# Patient Record
Sex: Female | Born: 1985 | Race: White | Hispanic: No | Marital: Married | Smoking: Never smoker
Health system: Southern US, Community
[De-identification: ages and names within clinical notes are randomized; demographics above are authoritative.]

## PROBLEM LIST (undated history)

## (undated) DIAGNOSIS — R112 Nausea with vomiting, unspecified: Secondary | ICD-10-CM

## (undated) DIAGNOSIS — Z9889 Other specified postprocedural states: Secondary | ICD-10-CM

## (undated) DIAGNOSIS — F419 Anxiety disorder, unspecified: Secondary | ICD-10-CM

## (undated) DIAGNOSIS — R519 Headache, unspecified: Secondary | ICD-10-CM

## (undated) DIAGNOSIS — O021 Missed abortion: Secondary | ICD-10-CM

## (undated) DIAGNOSIS — I639 Cerebral infarction, unspecified: Secondary | ICD-10-CM

## (undated) DIAGNOSIS — R51 Headache: Secondary | ICD-10-CM

## (undated) HISTORY — DX: Anxiety disorder, unspecified: F41.9

## (undated) HISTORY — DX: Cerebral infarction, unspecified: I63.9

## (undated) HISTORY — PX: WISDOM TOOTH EXTRACTION: SHX21

---

## 2013-04-05 ENCOUNTER — Telehealth: Payer: Self-pay | Admitting: Nurse Practitioner

## 2013-04-05 ENCOUNTER — Ambulatory Visit (INDEPENDENT_AMBULATORY_CARE_PROVIDER_SITE_OTHER): Payer: BC Managed Care – PPO | Admitting: Family Medicine

## 2013-04-05 ENCOUNTER — Encounter: Payer: Self-pay | Admitting: Family Medicine

## 2013-04-05 VITALS — BP 110/65 | HR 71 | Temp 98.6°F | Ht 64.0 in | Wt 162.8 lb

## 2013-04-05 DIAGNOSIS — R3915 Urgency of urination: Secondary | ICD-10-CM

## 2013-04-05 DIAGNOSIS — N390 Urinary tract infection, site not specified: Secondary | ICD-10-CM

## 2013-04-05 LAB — POCT URINALYSIS DIPSTICK
Bilirubin, UA: NEGATIVE
Glucose, UA: NEGATIVE
Nitrite, UA: NEGATIVE
Urobilinogen, UA: NEGATIVE

## 2013-04-05 LAB — POCT UA - MICROSCOPIC ONLY
Casts, Ur, LPF, POC: NEGATIVE
Yeast, UA: NEGATIVE

## 2013-04-05 NOTE — Telephone Encounter (Signed)
APPT MADE

## 2013-04-05 NOTE — Patient Instructions (Addendum)
Continue to drink plenty of fluids Will wait on treatment until urine culture and sensitivity is returned

## 2013-04-05 NOTE — Progress Notes (Signed)
  Subjective:    Patient ID: Kelly [name], female    DOB: [birth year], 27 y.o.   MRN: [id number]  HPI Recent UTI as of 2 weeks ago. Similar symptoms since 3 days ago. Maybe some increased urinary frequency but no burning. 2 weeks ago really didn't have a lot of symptoms but does have an odor to the urine that was different than usual. 2 of 3 episodes of sharp pain in the suprapubic area. No more back pain than usual. As of note she just finished her monthly period.   Review of Systems  Genitourinary: Positive for urgency and pelvic pain (slight pressure). Negative for dysuria and hematuria.  Neurological: Positive for headaches.       Objective:   Physical Exam  Constitutional: She is oriented to person, place, and time. She appears well-developed and well-nourished. No distress.  HENT:  Head: Normocephalic and atraumatic.  Eyes: Conjunctivae are normal.  Abdominal: Soft. Bowel sounds are normal. She exhibits no distension. There is no tenderness. There is no rebound and no guarding.  Musculoskeletal: Normal range of motion.  Neurological: She is alert and oriented to person, place, and time.  Skin: Skin is warm and dry.  Psychiatric: She has a normal mood and affect. Her behavior is normal. Judgment and thought content normal.   Results for orders placed in visit on 04/05/13  POCT UA - MICROSCOPIC ONLY      Result Value Range   WBC, Ur, HPF, POC 3-5     RBC, urine, microscopic 30-40     Bacteria, U Microscopic many     Mucus, UA large     Epithelial cells, urine per micros mod     Crystals, Ur, HPF, POC neg     Casts, Ur, LPF, POC neg     Yeast, UA neg    POCT URINALYSIS DIPSTICK      Result Value Range   Color, UA yellow     Clarity, UA cloudy     Glucose, UA neg     Bilirubin, UA neg     Ketones, UA neg     Spec Grav, UA 1.020     Blood, UA mod     pH, UA 5.0     Protein, UA neg     Urobilinogen, UA negative     Nitrite, UA neg     Leukocytes, UA Trace             Assessment & Plan:  1. UTI (urinary tract infection) - POCT UA - Microscopic Only - POCT urinalysis dipstick - Urine culture  2. Urgency of urination - Urine culture  Patient Instructions  Continue to drink plenty of fluids Will wait on treatment until urine culture and sensitivity is returned

## 2013-04-07 LAB — URINE CULTURE: Organism ID, Bacteria: NO GROWTH

## 2013-11-09 ENCOUNTER — Encounter: Payer: Self-pay | Admitting: Family Medicine

## 2013-11-09 ENCOUNTER — Encounter (INDEPENDENT_AMBULATORY_CARE_PROVIDER_SITE_OTHER): Payer: Self-pay

## 2013-11-09 ENCOUNTER — Ambulatory Visit (INDEPENDENT_AMBULATORY_CARE_PROVIDER_SITE_OTHER): Payer: BC Managed Care – PPO | Admitting: Family Medicine

## 2013-11-09 VITALS — BP 105/72 | HR 88 | Temp 99.0°F

## 2013-11-09 DIAGNOSIS — J209 Acute bronchitis, unspecified: Secondary | ICD-10-CM

## 2013-11-09 MED ORDER — FLUCONAZOLE 150 MG PO TABS: 150.0000 mg | ORAL_TABLET | Freq: Once | ORAL | Status: DC

## 2013-11-09 MED ORDER — METHYLPREDNISOLONE (PAK) 4 MG PO TABS
ORAL_TABLET | ORAL | Status: DC
Start: 2013-11-09 — End: 2014-01-28

## 2013-11-09 MED ORDER — AMOXICILLIN 875 MG PO TABS: 875.0000 mg | ORAL_TABLET | Freq: Two times a day (BID) | ORAL | Status: DC

## 2013-11-09 MED ORDER — BENZONATATE 100 MG PO CAPS: 200.0000 mg | ORAL_CAPSULE | Freq: Three times a day (TID) | ORAL | Status: DC | PRN

## 2013-11-09 NOTE — Progress Notes (Signed)
   Subjective:    Patient ID: Kelly [name], female    DOB: 1986-11-07, 27 y.o.   MRN: [id number]  HPI This 27 y.o. female presents for evaluation of URI. She states she was tx'd for sinusitis A week ago with abx's for 7 days and she did not get better and now it has moved into Her chest and she is couging a lot and especially at hs.   Review of Systems C/o cough and uri sx's. No chest pain, SOB, HA, dizziness, vision change, N/V, diarrhea, constipation, dysuria, urinary urgency or frequency, myalgias, arthralgias or rash.     Objective:   Physical Exam Vital signs noted  Well developed well nourished female.  HEENT - Head atraumatic Normocephalic                Eyes - PERRLA, Conjuctiva - clear Sclera- Clear EOMI                Ears - EAC's Wnl TM's Wnl Gross Hearing WNL                Nose - Nares patent                 Throat - oropharanx wnl Respiratory - Lungs CTA bilateral Cardiac - RRR S1 and S2 without murmur GI - Abdomen soft Nontender and bowel sounds active x 4        Assessment & Plan:  Acute bronchitis - Plan: amoxicillin (AMOXIL) 875 MG tablet, methylPREDNIsolone (MEDROL DOSPACK) 4 MG tablet, benzonatate (TESSALON PERLES) 100 MG capsule, fluconazole (DIFLUCAN) 150 MG tablet  Push po fluids, rest, tylenol and motrin otc prn as directed for fever, arthralgias, and myalgias.  Follow up prn if sx's continue or persist.  Deatra Canter [name]

## 2013-11-09 NOTE — Patient Instructions (Signed)

## 2013-11-10 ENCOUNTER — Telehealth: Payer: Self-pay | Admitting: Family Medicine

## 2013-11-11 NOTE — Telephone Encounter (Signed)
Bill o please address

## 2014-01-03 ENCOUNTER — Encounter: Payer: Self-pay | Admitting: Family Medicine

## 2014-01-03 ENCOUNTER — Ambulatory Visit (INDEPENDENT_AMBULATORY_CARE_PROVIDER_SITE_OTHER): Payer: BC Managed Care – PPO | Admitting: Family Medicine

## 2014-01-03 VITALS — BP 98/72 | HR 96 | Temp 99.4°F | Ht 64.0 in | Wt 161.4 lb

## 2014-01-03 DIAGNOSIS — R52 Pain, unspecified: Secondary | ICD-10-CM

## 2014-01-03 DIAGNOSIS — J111 Influenza due to unidentified influenza virus with other respiratory manifestations: Secondary | ICD-10-CM

## 2014-01-03 DIAGNOSIS — R6883 Chills (without fever): Secondary | ICD-10-CM

## 2014-01-03 DIAGNOSIS — J029 Acute pharyngitis, unspecified: Secondary | ICD-10-CM

## 2014-01-03 LAB — POCT INFLUENZA A/B
Influenza A, POC: NEGATIVE
Influenza B, POC: NEGATIVE

## 2014-01-03 MED ORDER — OSELTAMIVIR PHOSPHATE 75 MG PO CAPS: 75.0000 mg | ORAL_CAPSULE | Freq: Two times a day (BID) | ORAL | Status: DC

## 2014-01-03 NOTE — Progress Notes (Signed)
Patient ID: Kelly [name], female   DOB: [birth year], 28 y.o.   MRN: [id number] SUBJECTIVE: CC: Chief Complaint  Patient presents with  . Acute Visit    FLU LIKE SYMPTOMS     HPI: Sore Throat Patient complains of sore throat. Associated symptoms include chills, headache, myalgias, nasal blockage, post nasal drip, sinus and nasal congestion and sore throat. Onset of symptoms was 2 days ago, and have been gradually worsening since that time. She is drinking plenty of fluids. 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   No past medical history on file. Past Surgical History  Procedure Laterality Date  . Wisdom tooth extraction     History   Social History  . Marital Status: Single    Spouse Name: N/A    Number of Children: N/A  . Years of Education: N/A   Occupational History  . Not on file.   Social History Main Topics  . Smoking status: Never Smoker   . Smokeless tobacco: Never Used  . Alcohol Use: No  . Drug Use: No  . Sexual Activity: Not on file   Other Topics Concern  . Not on file   Social History Narrative  . No narrative on file   Family History  Problem Relation Age of Onset  . Healthy Mother   . Healthy Father    Current Outpatient Prescriptions on File Prior to Visit  Medication Sig Dispense Refill  . amoxicillin (AMOXIL) 875 MG tablet Take 1 tablet (875 mg total) by mouth 2 (two) times daily.  20 tablet  0  . benzonatate (TESSALON PERLES) 100 MG capsule Take 2 capsules (200 mg total) by mouth 3 (three) times daily as needed for cough.  30 capsule  1  . fluconazole (DIFLUCAN) 150 MG tablet Take 1 tablet (150 mg total) by mouth once.  1 tablet  0  . methylPREDNIsolone (MEDROL DOSPACK) 4 MG tablet follow package directions  21 tablet  0   No current facility-administered medications on file prior to visit.   No Known Allergies  There is no  immunization history on file for this patient. Prior to Admission medications   Medication Sig Start Date End Date Taking? Authorizing Provider  Norethin Ace-Eth Estrad-FE (LOESTRIN 24 FE PO) Take 1 tablet by mouth.   Yes Historical Provider, MD  amoxicillin (AMOXIL) 875 MG tablet Take 1 tablet (875 mg total) by mouth 2 (two) times daily. 11/09/13   Deatra CanterWilliam J Oxford, [name]  benzonatate (TESSALON PERLES) 100 MG capsule Take 2 capsules (200 mg total) by mouth 3 (three) times daily as needed for cough. 11/09/13   Deatra CanterWilliam J Oxford, [name]  fluconazole (DIFLUCAN) 150 MG tablet Take 1 tablet (150 mg total) by mouth once. 11/09/13   Deatra CanterWilliam J Oxford, [name]  methylPREDNIsolone (MEDROL DOSPACK) 4 MG tablet follow package directions 11/09/13   Deatra CanterWilliam J Oxford, [name]     ROS: As above in the HPI. All other systems are stable or negative.  OBJECTIVE: APPEARANCE:  Patient in no acute distress.The patient appeared well nourished and normally developed. Acyanotic. Waist: VITAL SIGNS:BP 98/72  Pulse 96  Temp(Src) 99.4 F (37.4 C) (Oral)  Ht 5\' 4"  (1.626 m)  Wt 161 lb 6.4 oz (73.211 kg)  BMI 27.69 kg/m2  LMP 11/02/2013  WF warm to touch.  SKIN: warm and  Dry without overt rashes, tattoos and scars  HEAD and Neck: without JVD,  Head and scalp: normal Eyes:No scleral icterus. Fundi normal, eye movements normal. Ears: Auricle normal, canal normal, Tympanic membranes normal, insufflation normal. Nose: nasal congestion Throat: normal Neck & thyroid: normal  CHEST & LUNGS: Chest wall: normal Lungs: Clear  CVS: Reveals the PMI to be normally located. Regular rhythm, First and Second Heart sounds are normal,  absence of murmurs, rubs or gallops. Peripheral vasculature: Radial pulses: normal Dorsal pedis pulses: normal Posterior pulses: normal  ABDOMEN:  Appearance: normal Benign, no organomegaly, no masses, no Abdominal Aortic enlargement. No Guarding , no rebound. No Bruits. Bowel sounds:  normal  RECTAL: N/A GU: N/A  EXTREMETIES: nonedematous.  MUSCULOSKELETAL:  Spine: normal Joints: intact  NEUROLOGIC: oriented to time,place and person; nonfocal. Strength is normal Sensory is normal Reflexes are normal Cranial Nerves are normal.  ASSESSMENT: Body aches - Plan: POCT Influenza A/B, oseltamivir (TAMIFLU) 75 MG capsule  Influenza - Plan: oseltamivir (TAMIFLU) 75 MG capsule  PLAN:  Orders Placed This Encounter  Procedures  . POCT Influenza A/B    Results for orders placed in visit on 01/03/14  POCT INFLUENZA A/B      Result Value Range   Influenza A, POC Negative     Influenza B, POC Negative     Still apprears to be the flu in absence of positive test. Handout in the [facility] on influenza.  OOW for 3 days.    Meds ordered this encounter  Medications  . Norethin Ace-Eth Estrad-FE (LOESTRIN 24 FE PO)    Sig: Take 1 tablet by mouth.  . oseltamivir (TAMIFLU) 75 MG capsule    Sig: Take 1 capsule (75 mg total) by mouth 2 (two) times daily.    Dispense:  10 capsule    Refill:  0   There are no discontinued medications. Return if symptoms worsen or fail to improve.  Kelly [name] Kelly [name], M.D.

## 2014-01-03 NOTE — Patient Instructions (Signed)

## 2014-01-28 ENCOUNTER — Encounter (INDEPENDENT_AMBULATORY_CARE_PROVIDER_SITE_OTHER): Payer: Self-pay

## 2014-01-28 ENCOUNTER — Encounter: Payer: Self-pay | Admitting: Family Medicine

## 2014-01-28 ENCOUNTER — Ambulatory Visit (INDEPENDENT_AMBULATORY_CARE_PROVIDER_SITE_OTHER): Payer: BC Managed Care – PPO | Admitting: Family Medicine

## 2014-01-28 VITALS — BP 103/57 | HR 76 | Temp 97.1°F | Ht 63.0 in | Wt 165.0 lb

## 2014-01-28 DIAGNOSIS — J209 Acute bronchitis, unspecified: Secondary | ICD-10-CM

## 2014-01-28 DIAGNOSIS — R300 Dysuria: Secondary | ICD-10-CM

## 2014-01-28 DIAGNOSIS — N390 Urinary tract infection, site not specified: Secondary | ICD-10-CM

## 2014-01-28 LAB — POCT UA - MICROSCOPIC ONLY
Bacteria, U Microscopic: NEGATIVE
Casts, Ur, LPF, POC: NEGATIVE
Crystals, Ur, HPF, POC: NEGATIVE
Mucus, UA: NEGATIVE
RBC, urine, microscopic: NEGATIVE
Yeast, UA: NEGATIVE

## 2014-01-28 LAB — POCT URINALYSIS DIPSTICK
Bilirubin, UA: NEGATIVE
Blood, UA: NEGATIVE
Glucose, UA: NEGATIVE
Ketones, UA: NEGATIVE
Nitrite, UA: NEGATIVE
Protein, UA: NEGATIVE
Spec Grav, UA: 1.01
Urobilinogen, UA: NEGATIVE
pH, UA: 7.5

## 2014-01-28 MED ORDER — FLUCONAZOLE 150 MG PO TABS: 150.0000 mg | ORAL_TABLET | Freq: Once | ORAL | Status: DC

## 2014-01-28 MED ORDER — CIPROFLOXACIN HCL 500 MG PO TABS: 500.0000 mg | ORAL_TABLET | Freq: Two times a day (BID) | ORAL | Status: DC

## 2014-01-28 NOTE — Progress Notes (Signed)
   Subjective:    Patient ID: Kelly GuppyJennifer [name], female    DOB: 1986-08-30, 28 y.o.   MRN: [id number]  HPI This 28 y.o. female presents for evaluation of URI.   Review of Systems No chest pain, SOB, HA, dizziness, vision change, N/V, diarrhea, constipation, dysuria, urinary urgency or frequency, myalgias, arthralgias or rash.     Objective:   Physical Exam  Vital signs noted  Well developed well nourished female.  HEENT - Head atraumatic Normocephalic                Eyes - PERRLA, Conjuctiva - clear Sclera- Clear EOMI                Ears - EAC's Wnl TM's Wnl Gross Hearing WNL                Nose - Nares patent                 Throat - oropharanx wnl Respiratory - Lungs CTA bilateral Cardiac - RRR S1 and S2 without murmur GI - Abdomen soft Nontender and bowel sounds active x 4 Extremities - No edema. Neuro - Grossly intact.      Results for orders placed in visit on 01/28/14  POCT UA - MICROSCOPIC ONLY      Result Value Ref Range   WBC, Ur, HPF, POC 5-10     RBC, urine, microscopic neg     Bacteria, U Microscopic neg     Mucus, UA neg     Epithelial cells, urine per micros occ     Crystals, Ur, HPF, POC neg     Casts, Ur, LPF, POC neg     Yeast, UA neg    POCT URINALYSIS DIPSTICK      Result Value Ref Range   Color, UA yellow     Clarity, UA clear     Glucose, UA neg     Bilirubin, UA neg     Ketones, UA neg     Spec Grav, UA 1.010     Blood, UA neg     pH, UA 7.5     Protein, UA neg     Urobilinogen, UA negative     Nitrite, UA neg     Leukocytes, UA Trace     Assessment & Plan:  Dysuria - Plan: POCT UA - Microscopic Only, POCT urinalysis dipstick, ciprofloxacin (CIPRO) 500 MG tablet  UTI (urinary tract infection) - Plan: ciprofloxacin (CIPRO) 500 MG tablet po bid x 7 days  Diflucan 10mg  one po qd #2  Deatra CanterWilliam J Ajeet Casasola [name]

## 2014-04-02 ENCOUNTER — Encounter: Payer: Self-pay | Admitting: General Practice

## 2014-04-02 ENCOUNTER — Ambulatory Visit (INDEPENDENT_AMBULATORY_CARE_PROVIDER_SITE_OTHER): Payer: BC Managed Care – PPO | Admitting: General Practice

## 2014-04-02 VITALS — BP 111/72 | HR 61 | Temp 98.3°F | Ht 63.0 in | Wt 165.8 lb

## 2014-04-02 DIAGNOSIS — B373 Candidiasis of vulva and vagina: Secondary | ICD-10-CM

## 2014-04-02 DIAGNOSIS — N926 Irregular menstruation, unspecified: Secondary | ICD-10-CM

## 2014-04-02 DIAGNOSIS — B3731 Acute candidiasis of vulva and vagina: Secondary | ICD-10-CM

## 2014-04-02 DIAGNOSIS — R350 Frequency of micturition: Secondary | ICD-10-CM

## 2014-04-02 DIAGNOSIS — N390 Urinary tract infection, site not specified: Secondary | ICD-10-CM

## 2014-04-02 LAB — POCT URINALYSIS DIPSTICK
Bilirubin, UA: NEGATIVE
Glucose, UA: NEGATIVE
KETONES UA: NEGATIVE
Nitrite, UA: NEGATIVE
PROTEIN UA: NEGATIVE
Spec Grav, UA: <=1.005
UROBILINOGEN UA: NEGATIVE
pH, UA: 7.5

## 2014-04-02 LAB — POCT URINE PREGNANCY: Preg Test, Ur: NEGATIVE

## 2014-04-02 MED ORDER — FLUCONAZOLE 150 MG PO TABS: 150.0000 mg | ORAL_TABLET | Freq: Once | ORAL | Status: DC

## 2014-04-02 MED ORDER — CEPHALEXIN 500 MG PO CAPS: 500.0000 mg | ORAL_CAPSULE | Freq: Two times a day (BID) | ORAL | Status: DC

## 2014-04-02 NOTE — Progress Notes (Signed)
   Subjective:    Patient ID: Kelly [name], female    DOB: January 13, 1986, 28 y.o.   MRN: [id number]  Urinary Tract Infection  This is a new problem. The current episode started in the past 7 days. The problem occurs every urination. The problem has been gradually worsening. There has been no fever. She is sexually active. There is no history of pyelonephritis. Associated symptoms include flank pain, frequency and urgency. Pertinent negatives include no chills, hematuria, nausea or vomiting. She has tried nothing for the symptoms. Her past medical history is significant for recurrent UTIs.  Patient denies having a menstrual cycle since the end of March 2015 and she discontinued birth control in February 2015.     Review of Systems  Constitutional: Negative for fever and chills.  Respiratory: Negative for chest tightness and shortness of breath.   Cardiovascular: Negative for chest pain and palpitations.  Gastrointestinal: Positive for abdominal pain. Negative for nausea and vomiting.  Genitourinary: Positive for urgency, frequency and flank pain. Negative for hematuria.  Neurological: Negative for dizziness, weakness and headaches.       Objective:   Physical Exam  Constitutional: She is oriented to person, place, and time. She appears well-developed and well-nourished.  Cardiovascular: Normal rate, regular rhythm and normal heart sounds.   Pulmonary/Chest: Effort normal and breath sounds normal. No respiratory distress. She exhibits no tenderness.  Abdominal: Soft. Bowel sounds are normal. She exhibits no distension. There is tenderness in the suprapubic area.  Neurological: She is alert and oriented to person, place, and time.  Skin: Skin is warm and dry.  Psychiatric: She has a normal mood and affect.     Results for orders placed in visit on 04/02/14  POCT URINALYSIS DIPSTICK      Result Value Ref Range   Color, UA yellow     Clarity, UA clear     Glucose, UA negative      Bilirubin, UA negative     Ketones, UA negatiive     Spec Grav, UA <=1.005     Blood, UA trace     pH, UA 7.5     Protein, UA negative     Urobilinogen, UA negative     Nitrite, UA negative     Leukocytes, UA large (3+)    POCT URINE PREGNANCY      Result Value Ref Range   Preg Test, Ur Negative           Assessment & Plan:  1. Frequent urination  - POCT urinalysis dipstick - Urine culture  2. Irregular menstrual cycle  - POCT urine pregnancy  3. UTI (urinary tract infection) - cephALEXin (KEFLEX) 500 MG capsule; Take 1 capsule (500 mg total) by mouth 2 (two) times daily.  Dispense: 20 capsule; Refill: 0  4. Vulvovaginal candidiasis  - fluconazole (DIFLUCAN) 150 MG tablet; Take 1 tablet (150 mg total) by mouth once. May repeat in 72 hours  Dispense: 2 tablet; Refill: 0 -patient education provided and discussed -Increase fluid intake AZO over the counter X2 days Frequent voiding Proper perineal hygiene RTO prn Culture pending Patient verbalized understanding Coralie KeensMae E. Elda Dunkerson, [name]-C

## 2014-04-02 NOTE — Patient Instructions (Signed)
Urinary Tract Infection  Urinary tract infections (UTIs) can develop anywhere along your urinary tract. Your urinary tract is your body's drainage system for removing wastes and extra water. Your urinary tract includes two kidneys, two ureters, a bladder, and a urethra. Your kidneys are a pair of bean-shaped organs. Each kidney is about the size of your fist. They are located below your ribs, one on each side of your spine.  CAUSES  Infections are caused by microbes, which are microscopic organisms, including fungi, viruses, and bacteria. These organisms are so small that they can only be seen through a microscope. Bacteria are the microbes that most commonly cause UTIs.  SYMPTOMS   Symptoms of UTIs may vary by age and gender of the patient and by the location of the infection. Symptoms in young women typically include a frequent and intense urge to urinate and a painful, burning feeling in the bladder or urethra during urination. Older women and men are more likely to be tired, shaky, and weak and have muscle aches and abdominal pain. A fever may mean the infection is in your kidneys. Other symptoms of a kidney infection include pain in your back or sides below the ribs, nausea, and vomiting.  DIAGNOSIS  To diagnose a UTI, your caregiver will ask you about your symptoms. Your caregiver also will ask to provide a urine sample. The urine sample will be tested for bacteria and white blood cells. White blood cells are made by your body to help fight infection.  TREATMENT   Typically, UTIs can be treated with medication. Because most UTIs are caused by a bacterial infection, they usually can be treated with the use of antibiotics. The choice of antibiotic and length of treatment depend on your symptoms and the type of bacteria causing your infection.  HOME CARE INSTRUCTIONS   If you were prescribed antibiotics, take them exactly as your caregiver instructs you. Finish the medication even if you feel better after you  have only taken some of the medication.   Drink enough water and fluids to keep your urine clear or pale yellow.   Avoid caffeine, tea, and carbonated beverages. They tend to irritate your bladder.   Empty your bladder often. Avoid holding urine for long periods of time.   Empty your bladder before and after sexual intercourse.   After a bowel movement, women should cleanse from front to back. Use each tissue only once.  SEEK MEDICAL CARE IF:    You have back pain.   You develop a fever.   Your symptoms do not begin to resolve within 3 days.  SEEK IMMEDIATE MEDICAL CARE IF:    You have severe back pain or lower abdominal pain.   You develop chills.   You have nausea or vomiting.   You have continued burning or discomfort with urination.  MAKE SURE YOU:    Understand these instructions.   Will watch your condition.   Will get help right away if you are not doing well or get worse.  Document Released: 08/28/2005 Document Revised: 05/19/2012 Document Reviewed: 12/27/2011  [employer] Patient Information 2014 [employer]

## 2014-04-04 NOTE — Addendum Note (Signed)
Addended by: Orma RenderHODGES, Ermon Sagan F on: 04/04/2014 10:27 AM   Modules accepted: Orders

## 2014-04-04 NOTE — Addendum Note (Signed)
Addended by: Orma RenderHODGES, Daquisha Clermont F on: 04/04/2014 10:28 AM   Modules accepted: Orders

## 2014-04-05 LAB — URINE CULTURE

## 2014-05-19 ENCOUNTER — Encounter: Payer: Self-pay | Admitting: Nurse Practitioner

## 2014-05-19 ENCOUNTER — Ambulatory Visit (INDEPENDENT_AMBULATORY_CARE_PROVIDER_SITE_OTHER): Payer: BC Managed Care – PPO | Admitting: Nurse Practitioner

## 2014-05-19 VITALS — BP 108/68 | HR 93 | Temp 99.2°F | Ht 63.0 in | Wt 165.6 lb

## 2014-05-19 DIAGNOSIS — J0100 Acute maxillary sinusitis, unspecified: Secondary | ICD-10-CM

## 2014-05-19 MED ORDER — AZITHROMYCIN 250 MG PO TABS: ORAL_TABLET | ORAL | Status: DC

## 2014-05-19 MED ORDER — FLUCONAZOLE 150 MG PO TABS: 150.0000 mg | ORAL_TABLET | Freq: Once | ORAL | Status: DC

## 2014-05-19 MED ORDER — CHLORPHEN-PE-ACETAMINOPHEN 4-10-325 MG PO TABS: 1.0000 | ORAL_TABLET | Freq: Two times a day (BID) | ORAL | Status: DC

## 2014-05-19 NOTE — Patient Instructions (Signed)

## 2014-05-19 NOTE — Progress Notes (Signed)
Subjective:    Patient ID: Kelly [name], female    DOB: September 26, 1986, 28 y.o.   MRN: [id number]  Sinusitis This is a recurrent problem. The current episode started more than 1 month ago. The problem has been gradually worsening since onset. There has been no fever. Associated symptoms include congestion, coughing, ear pain, headaches, a hoarse voice, sinus pressure, sneezing and a sore throat. Past treatments include saline sprays. The treatment provided no relief.      Review of Systems  Constitutional: Negative.   HENT: Positive for congestion, ear pain, hoarse voice, sinus pressure, sneezing and sore throat.   Eyes: Negative.   Respiratory: Positive for cough.   Endocrine: Negative.   Genitourinary: Negative.   Musculoskeletal: Negative.   Allergic/Immunologic: Negative.   Neurological: Positive for headaches.  Hematological: Negative.   Psychiatric/Behavioral: Negative.        Objective:   Physical Exam  Constitutional: She is oriented to person, place, and time. She appears well-developed and well-nourished.  HENT:  Head: Normocephalic and atraumatic.  Right Ear: Hearing, tympanic membrane, external ear and ear canal normal.  Left Ear: Hearing, tympanic membrane, external ear and ear canal normal.  Nose: Mucosal edema and rhinorrhea present. Right sinus exhibits maxillary sinus tenderness. Right sinus exhibits no frontal sinus tenderness. Left sinus exhibits maxillary sinus tenderness. Left sinus exhibits no frontal sinus tenderness.  Mouth/Throat: Uvula is midline, oropharynx is clear and moist and mucous membranes are normal.  Eyes: Pupils are equal, round, and reactive to light.  Neck: Normal range of motion. Neck supple.  Cardiovascular: Normal rate.   Pulmonary/Chest: Effort normal.  Abdominal: Soft.  Neurological: She is alert and oriented to person, place, and time.  Skin: Skin is warm.  Psychiatric: She has a normal mood and affect. Her behavior is  normal. Judgment and thought content normal.     BP 108/68  Pulse 93  Temp(Src) 99.2 F (37.3 C) (Oral)  Ht 5\' 3"  (1.6 m)  Wt 165 lb 9.6 oz (75.116 kg)  BMI 29.34 kg/m2       Assessment & Plan:   1. Acute maxillary sinusitis, recurrence not specified    Meds ordered this encounter  Medications  . cetirizine (ZYRTEC) 10 MG tablet    Sig: Take 10 mg by mouth daily.  Marland Kitchen. azithromycin (ZITHROMAX Z-PAK) 250 MG tablet    Sig: As directed    Dispense:  6 each    Refill:  0    Order Specific Question:  Supervising Provider    Answer:  Ernestina PennaMOORE, DONALD W [[id number]]  . Chlorphen-PE-Acetaminophen 4-10-325 MG TABS    Sig: Take 1 tablet by mouth 2 (two) times daily.    Dispense:  20 tablet    Refill:  0    Order Specific Question:  Supervising Provider    Answer:  Ernestina PennaMOORE, DONALD W [[id number]]  . fluconazole (DIFLUCAN) 150 MG tablet    Sig: Take 1 tablet (150 mg total) by mouth once.    Dispense:  1 tablet    Refill:  0    Order Specific Question:  Supervising Provider    Answer:  Ernestina PennaMOORE, DONALD W [[id number]]   1. Take meds as prescribed 2. Use a cool mist humidifier especially during the winter months and when heat has been humid. 3. Use saline nose sprays frequently 4. Saline irrigations of the nose can be very helpful if done frequently.  * 4X daily for 1 week*  * Use of a nettie pot can be  helpful with this. Follow directions with this* 5. Drink plenty of fluids 6. Keep thermostat turn down low 7.For any cough or congestion  Use plain Mucinex- regular strength or max strength is fine   * Children- consult with Pharmacist for dosing 8. For fever or aces or pains- take tylenol or ibuprofen appropriate for age and weight.  * for fevers greater than 101 orally you may alternate ibuprofen and tylenol every  3 hours. RTO prn  Mary-Margaret Daphine DeutscherMartin, [name]

## 2014-05-23 ENCOUNTER — Telehealth: Payer: Self-pay | Admitting: Nurse Practitioner

## 2014-05-23 NOTE — Telephone Encounter (Signed)
Sometimes it takes a while to completely resolve- zithromax stays in system for 10 days even though only take for 5 days- give it time.

## 2014-05-24 NOTE — Telephone Encounter (Signed)
Pt aware to give med a little more time

## 2014-07-11 ENCOUNTER — Ambulatory Visit (INDEPENDENT_AMBULATORY_CARE_PROVIDER_SITE_OTHER): Payer: BC Managed Care – PPO | Admitting: Family Medicine

## 2014-07-11 ENCOUNTER — Encounter: Payer: Self-pay | Admitting: Family Medicine

## 2014-07-11 ENCOUNTER — Ambulatory Visit (INDEPENDENT_AMBULATORY_CARE_PROVIDER_SITE_OTHER): Payer: BC Managed Care – PPO

## 2014-07-11 VITALS — BP 109/75 | HR 80 | Temp 98.4°F | Ht 63.0 in | Wt 166.8 lb

## 2014-07-11 DIAGNOSIS — J320 Chronic maxillary sinusitis: Secondary | ICD-10-CM

## 2014-07-11 DIAGNOSIS — J0101 Acute recurrent maxillary sinusitis: Secondary | ICD-10-CM

## 2014-07-11 DIAGNOSIS — J0100 Acute maxillary sinusitis, unspecified: Secondary | ICD-10-CM

## 2014-07-11 MED ORDER — METHYLPREDNISOLONE (PAK) 4 MG PO TABS: ORAL_TABLET | ORAL | Status: DC

## 2014-07-11 MED ORDER — MONTELUKAST SODIUM 10 MG PO TABS: 10.0000 mg | ORAL_TABLET | Freq: Every day | ORAL | Status: DC

## 2014-07-11 MED ORDER — FLUCONAZOLE 150 MG PO TABS: 150.0000 mg | ORAL_TABLET | Freq: Once | ORAL | Status: DC

## 2014-07-11 MED ORDER — AMOXICILLIN 875 MG PO TABS: 875.0000 mg | ORAL_TABLET | Freq: Two times a day (BID) | ORAL | Status: DC

## 2014-07-12 NOTE — Progress Notes (Signed)
   Subjective:    Patient ID: Kelly [name], female    DOB: December 21, 1985, 28 y.o.   MRN: [id number]  HPI  C/o sinus infection.  She has hx of allergies and she is getting frequent sinus infections.  Review of Systems    No chest pain, SOB, HA, dizziness, vision change, N/V, diarrhea, constipation, dysuria, urinary urgency or frequency, myalgias, arthralgias or rash.  Objective:   Physical Exam   Vital signs noted  Well developed well nourished female.  HEENT - Head atraumatic Normocephalic                Eyes - PERRLA, Conjuctiva - clear Sclera- Clear EOMI                Ears - EAC's Wnl TM's Wnl Gross Hearing WNL                Nose - Nares patent                 Throat - oropharanx wnl Respiratory - Lungs CTA bilateral Cardiac - RRR S1 and S2 without murmur GI - Abdomen soft Nontender and bowel sounds active x 4 Extremities - No edema. Neuro - Grossly intact.     Assessment & Plan:  Acute recurrent maxillary sinusitis - Plan: DG Sinuses Complete, amoxicillin (AMOXIL) 875 MG tablet, methylPREDNIsolone (MEDROL DOSPACK) 4 MG tablet, montelukast (SINGULAIR) 10 MG tablet, fluconazole (DIFLUCAN) 150 MG tablet, DISCONTINUED: fluconazole (DIFLUCAN) 150 MG tablet  Chronic maxillary sinusitis - Plan: Ambulatory referral to ENT  Deatra CanterWilliam J Oxford [name]

## 2014-08-27 ENCOUNTER — Ambulatory Visit (INDEPENDENT_AMBULATORY_CARE_PROVIDER_SITE_OTHER): Payer: BC Managed Care – PPO | Admitting: Family Medicine

## 2014-08-27 VITALS — BP 105/71 | HR 58 | Temp 98.0°F | Ht 63.0 in | Wt 168.8 lb

## 2014-08-27 DIAGNOSIS — H109 Unspecified conjunctivitis: Secondary | ICD-10-CM

## 2014-08-27 MED ORDER — SULFACETAMIDE SODIUM 10 % OP SOLN
1.0000 [drp] | OPHTHALMIC | Status: DC
Start: 2014-08-27 — End: 2014-10-26

## 2014-08-27 NOTE — Progress Notes (Signed)
   Subjective:    Patient ID: Kelly [name], female    DOB: [birth year], 28 y.o.   MRN: [id number]  HPI  This 28 y.o. female presents for evaluation of left eye drainage and swelling.  Review of Systems    No chest pain, SOB, HA, dizziness, vision change, N/V, diarrhea, constipation, dysuria, urinary urgency or frequency, myalgias, arthralgias or rash.  Objective:   Physical Exam  Vital signs noted  Well developed well nourished female.  HEENT - Head atraumatic Normocephalic                Eyes - OS injected with exudate Respiratory - Lungs CTA bilateral Cardiac - RRR S1 and S2 without murmur      Assessment & Plan:  Conjunctivitis of left eye - Plan: sulfacetamide (BLEPH-10) 10 % ophthalmic solution q 3 hours w/a  Deatra Canter [name]

## 2014-10-26 ENCOUNTER — Ambulatory Visit (INDEPENDENT_AMBULATORY_CARE_PROVIDER_SITE_OTHER): Payer: BC Managed Care – PPO | Admitting: Family Medicine

## 2014-10-26 ENCOUNTER — Encounter: Payer: Self-pay | Admitting: Family Medicine

## 2014-10-26 VITALS — BP 118/75 | HR 82 | Temp 98.0°F | Ht 63.0 in | Wt 168.2 lb

## 2014-10-26 DIAGNOSIS — J019 Acute sinusitis, unspecified: Secondary | ICD-10-CM

## 2014-10-26 MED ORDER — AMOXICILLIN-POT CLAVULANATE 875-125 MG PO TABS
1.0000 | ORAL_TABLET | Freq: Two times a day (BID) | ORAL | Status: DC
Start: 2014-10-26 — End: 2015-03-23

## 2014-10-26 NOTE — Patient Instructions (Signed)
Take antibiotic as directed with food Continue to use the Nettie pot Take Tylenol or ibuprofen as needed for aches and pains and fever Drink plenty of fluids Use Flonase nasal spray at nighttime 1-2 sprays each nostril Mucinex maximum strength plain, over-the-counter blue and white in color one twice daily with a large glass of fluids for cough and congestion

## 2014-10-26 NOTE — Progress Notes (Signed)
   Subjective:    Patient ID: Kelly [name], female    DOB: [birth year], 28 y.o.   MRN: [id number]  HPI  Patient comes in today complaining with facial pressure, sinus headache and ears popping.      Review of Systems  Constitutional: Negative.   HENT: Positive for sinus pressure.   Eyes: Negative.   Respiratory: Negative.   Cardiovascular: Negative.   Gastrointestinal: Negative.   Endocrine: Negative.   Genitourinary: Negative.   Musculoskeletal: Negative.   Skin: Negative.   Allergic/Immunologic: Negative.   Neurological: Positive for headaches.  Hematological: Negative.   Psychiatric/Behavioral: Negative.    Patient Active Problem List   Diagnosis Date Noted  . Influenza 01/03/2014  . Body aches 01/03/2014   Outpatient Encounter Prescriptions as of 10/26/2014  Medication Sig  . cetirizine (ZYRTEC) 10 MG tablet Take 10 mg by mouth daily.  . [DISCONTINUED] sulfacetamide (BLEPH-10) 10 % ophthalmic solution Place 1 drop into the left eye every 3 (three) hours.       Objective:   Physical Exam  Constitutional: She is oriented to person, place, and time. She appears well-developed and well-nourished.  HENT:  Head: Normocephalic and atraumatic.  Right Ear: External ear normal.  Mouth/Throat: Oropharynx is clear and moist.  The left TM is retracted. There is nasal congestion bilaterally left greater than right with turbinate swelling. Posterior throat appears normal. There is maxillary and ethmoid sinus tenderness.  Eyes: Conjunctivae and EOM are normal. Pupils are equal, round, and reactive to light. Right eye exhibits no discharge. Left eye exhibits no discharge. No scleral icterus.  Neck: Normal range of motion. Neck supple. No thyromegaly present.  No anterior cervical adenopathy  Cardiovascular: Normal rate, regular rhythm and normal heart sounds.   No murmur heard. Pulmonary/Chest: Effort normal and breath sounds normal. No respiratory distress. She has no  wheezes. She has no rales. She exhibits no tenderness.  Abdominal: She exhibits no mass.  Musculoskeletal: Normal range of motion.  Lymphadenopathy:    She has no cervical adenopathy.  Neurological: She is alert and oriented to person, place, and time. No cranial nerve deficit.  Skin: Skin is warm and dry. No rash noted.  Psychiatric: She has a normal mood and affect. Her behavior is normal. Judgment and thought content normal.  Nursing note and vitals reviewed.  BP 118/75 mmHg  Pulse 82  Temp(Src) 98 F (36.7 C) (Oral)  Ht 5\' 3"  (1.6 m)  Wt 168 lb 3.2 oz (76.295 kg)  BMI 29.80 kg/m2        Assessment & Plan:  1. Acute rhinosinusitis - amoxicillin-clavulanate (AUGMENTIN) 875-125 MG per tablet; Take 1 tablet by mouth 2 (two) times daily.  Dispense: 20 tablet; Refill: 0  Patient Instructions  Take antibiotic as directed with food Continue to use the Nettie pot Take Tylenol or ibuprofen as needed for aches and pains and fever Drink plenty of fluids Use Flonase nasal spray at nighttime 1-2 sprays each nostril Mucinex maximum strength plain, over-the-counter blue and white in color one twice daily with a large glass of fluids for cough and congestion   Nyra Capeson W. Rafaella Kole MD

## 2014-10-31 ENCOUNTER — Telehealth: Payer: Self-pay | Admitting: Family Medicine

## 2014-11-01 NOTE — Telephone Encounter (Signed)
Not accepting voice mail.  Patient should get zantac over the counter and take twice daily before a meal and medicine.  This should help with indigestion.

## 2014-11-03 NOTE — Telephone Encounter (Signed)
Stp reviewed advised from provider, pt voiced understanding, she has been using the nettie pot with some relief and will try zantac for indigestion.

## 2015-02-06 ENCOUNTER — Telehealth: Payer: Self-pay | Admitting: Family Medicine

## 2015-02-06 ENCOUNTER — Ambulatory Visit: Payer: BC Managed Care – PPO | Admitting: Family

## 2015-02-06 NOTE — Telephone Encounter (Signed)
Appointment given for 3:00pm.

## 2015-03-23 ENCOUNTER — Encounter: Payer: Self-pay | Admitting: Family Medicine

## 2015-03-23 ENCOUNTER — Ambulatory Visit (INDEPENDENT_AMBULATORY_CARE_PROVIDER_SITE_OTHER): Payer: BC Managed Care – PPO | Admitting: Family Medicine

## 2015-03-23 DIAGNOSIS — J0101 Acute recurrent maxillary sinusitis: Secondary | ICD-10-CM

## 2015-03-23 MED ORDER — FLUCONAZOLE 150 MG PO TABS: 150.0000 mg | ORAL_TABLET | Freq: Once | ORAL | Status: DC

## 2015-03-23 MED ORDER — LEVOFLOXACIN 500 MG PO TABS: 500.0000 mg | ORAL_TABLET | Freq: Every day | ORAL | Status: DC

## 2015-03-23 MED ORDER — BETAMETHASONE SOD PHOS & ACET 6 (3-3) MG/ML IJ SUSP
6.0000 mg | Freq: Once | INTRAMUSCULAR | Status: AC
  Administered 2015-03-23: 6 mg via INTRAMUSCULAR

## 2015-03-23 NOTE — Progress Notes (Signed)
Subjective:  Patient ID: Harless Litten, female    DOB: [birth year]  Age: 29 y.o. MRN: [id number]  CC: Sinus Problem   HPI Salisa Broz presents for Symptoms include congestion, facial pain, pain in teeth, nasal congestion, no  fever, non productive cough, post nasal drip and sinus pressure with no fever, chills, night sweats or weight loss. Onset of symptoms was a few days ago, gradually worsening since that time. Pt.is drinking moderate amounts of fluids.     History Debbera has no past medical history on file.   She has past surgical history that includes Wisdom tooth extraction.   Her family history includes Healthy in her father and mother.She reports that she has never smoked. She has never used smokeless tobacco. She reports that she does not drink alcohol or use illicit drugs.  Current Outpatient Prescriptions on File Prior to Visit  Medication Sig Dispense Refill  . cetirizine (ZYRTEC) 10 MG tablet Take 10 mg by mouth daily.     No current facility-administered medications on file prior to visit.    ROS Review of Systems  Constitutional: Negative for fever, chills, activity change and appetite change.  HENT: Positive for congestion, postnasal drip, rhinorrhea and sinus pressure. Negative for ear discharge, ear pain, hearing loss, nosebleeds, sneezing and trouble swallowing.   Respiratory: Negative for chest tightness and shortness of breath.   Cardiovascular: Negative for chest pain and palpitations.  Skin: Negative for rash.    Objective:  BP 113/70 mmHg  Pulse 87  Temp(Src) 98.2 F (36.8 C) (Oral)  Ht  (1.6 m)  Wt 172 lb (78.019 kg)  BMI 30.48 kg/m2  BP Readings from Last 3 Encounters:  03/23/15 113/70  10/26/14 118/75  08/27/14 105/71    Wt Readings from Last 3 Encounters:  03/23/15 172 lb (78.019 kg)  10/26/14 168 lb 3.2 oz (76.295 kg)  08/27/14 168 lb 12.8 oz (76.567 kg)     Physical Exam  Constitutional: She appears  well-developed and well-nourished.  HENT:  Head: Normocephalic and atraumatic.  Right Ear: Tympanic membrane and external ear normal. No decreased hearing is noted.  Left Ear: Tympanic membrane and external ear normal. No decreased hearing is noted.  Nose: Mucosal edema present. Right sinus exhibits no frontal sinus tenderness. Left sinus exhibits no frontal sinus tenderness.  Mouth/Throat: No oropharyngeal exudate or posterior oropharyngeal erythema.  Neck: No Brudzinski's sign noted.  Pulmonary/Chest: Breath sounds normal. No respiratory distress.  Lymphadenopathy:       Head (right side): No preauricular adenopathy present.       Head (left side): No preauricular adenopathy present.       Right cervical: No superficial cervical adenopathy present.      Left cervical: No superficial cervical adenopathy present.    No results found for: HGBA1C  No results found for: WBC, HGB, HCT, PLT, GLUCOSE, CHOL, TRIG, HDL, LDLDIRECT, LDLCALC, ALT, AST, NA, K, CL, CREATININE, BUN, CO2, TSH, PSA, INR, GLUF, HGBA1C, MICROALBUR  Patient was never admitted.  Assessment & Plan:   Aryianna was seen today for sinus problem.  Diagnoses and all orders for this visit:  Acute recurrent maxillary sinusitis Orders: -     betamethasone acetate-betamethasone sodium phosphate (CELESTONE) injection 6 mg; Inject 1 mL (6 mg total) into the muscle once.  Other orders -     Discontinue: levofloxacin (LEVAQUIN) 500 MG tablet; Take 1 tablet (500 mg total) by mouth daily. -     Discontinue: levofloxacin (LEVAQUIN) 500 MG  tablet; Take 1 tablet (500 mg total) by mouth daily. -     fluconazole (DIFLUCAN) 150 MG tablet; Take 1 tablet (150 mg total) by mouth once.   I have discontinued Ms. Brimley's amoxicillin-clavulanate and levofloxacin. I am also having her start on fluconazole. Additionally, I am having her maintain her cetirizine. We administered betamethasone acetate-betamethasone sodium phosphate.  Meds  ordered this encounter  Medications  . DISCONTD: levofloxacin (LEVAQUIN) 500 MG tablet    Sig: Take 1 tablet (500 mg total) by mouth daily.    Dispense:  10 tablet    Refill:  0  . betamethasone acetate-betamethasone sodium phosphate (CELESTONE) injection 6 mg    Sig:   . DISCONTD: levofloxacin (LEVAQUIN) 500 MG tablet    Sig: Take 1 tablet (500 mg total) by mouth daily.    Dispense:  10 tablet    Refill:  0  . fluconazole (DIFLUCAN) 150 MG tablet    Sig: Take 1 tablet (150 mg total) by mouth once.    Dispense:  1 tablet    Refill:  0     Follow-up: Return if symptoms worsen or fail to improve.  Mechele ClaudeWarren Tyquasia Pant, M.D.

## 2015-03-27 ENCOUNTER — Telehealth: Payer: Self-pay | Admitting: Family Medicine

## 2015-03-27 MED ORDER — AZITHROMYCIN 250 MG PO TABS: ORAL_TABLET | ORAL | Status: DC

## 2015-03-27 NOTE — Telephone Encounter (Signed)
zpak sent to pharmacy 

## 2015-03-27 NOTE — Telephone Encounter (Signed)
Pt was taking abx with food. Gave her diarrhea and stomach cramps which caused her to stay close to the restroom. Has not taken any since Saturday

## 2015-03-27 NOTE — Telephone Encounter (Signed)
Take with food

## 2015-03-28 NOTE — Telephone Encounter (Signed)
Pt aware Rx sent to [facility] 

## 2015-05-03 DIAGNOSIS — O021 Missed abortion: Secondary | ICD-10-CM

## 2015-05-03 HISTORY — DX: Missed abortion: O02.1

## 2015-07-13 ENCOUNTER — Encounter: Payer: Self-pay | Admitting: Family Medicine

## 2015-07-13 ENCOUNTER — Ambulatory Visit (INDEPENDENT_AMBULATORY_CARE_PROVIDER_SITE_OTHER): Payer: BC Managed Care – PPO | Admitting: Family Medicine

## 2015-07-13 VITALS — BP 103/69 | HR 82 | Temp 98.1°F | Ht 63.0 in | Wt 172.6 lb

## 2015-07-13 DIAGNOSIS — R300 Dysuria: Secondary | ICD-10-CM | POA: Diagnosis not present

## 2015-07-13 LAB — POCT URINALYSIS DIPSTICK
BILIRUBIN UA: NEGATIVE
Blood, UA: NEGATIVE
Glucose, UA: NEGATIVE
KETONES UA: NEGATIVE
NITRITE UA: NEGATIVE
Protein, UA: NEGATIVE
Spec Grav, UA: <=1.005
Urobilinogen, UA: NEGATIVE
pH, UA: 7.5

## 2015-07-13 LAB — POCT UA - MICROSCOPIC ONLY
BACTERIA, U MICROSCOPIC: NEGATIVE
CRYSTALS, UR, HPF, POC: NEGATIVE
Casts, Ur, LPF, POC: NEGATIVE
MUCUS UA: NEGATIVE
RBC, urine, microscopic: NEGATIVE
YEAST UA: NEGATIVE

## 2015-07-13 MED ORDER — PHENAZOPYRIDINE HCL 100 MG PO TABS: 100.0000 mg | ORAL_TABLET | Freq: Three times a day (TID) | ORAL | Status: DC | PRN

## 2015-07-13 MED ORDER — CEPHALEXIN 500 MG PO CAPS: 500.0000 mg | ORAL_CAPSULE | Freq: Four times a day (QID) | ORAL | Status: AC

## 2015-07-13 NOTE — Progress Notes (Signed)
BP 103/69 mmHg  Pulse 82  Temp(Src) 98.1 F (36.7 C) (Oral)  Ht 5\' 3"  (1.6 m)  Wt 172 lb 9.6 oz (78.291 kg)  BMI 30.58 kg/m2   Subjective:    Patient ID: Kelly [name], female    DOB: Nov 14, 1986, 29 y.o.   MRN: [id number]  HPI: Kelly [name] is a 29 y.o. female presenting on 07/13/2015 for voiding pain   HPI Dysuria Patient presents today with a three-day history of dysuria. She has had 4-5 UTIs in the past year and a half since she has been married, she did not have any UTIs before that point in her life. Her most recent UTI was 3 weeks ago which was treated by her OB/GYN doctor with what she thinks may have been Bactrim. Her usual symptoms that go along with UTIs is odor in her urine. This time she describes burning sensation when she urinates and a sensation of having to urinate again. This is the first time that she has had this. She denies any fevers or chills, she denies any suprapubic pain, she denies any flank pain or tenderness or hematuria.  Relevant past medical, surgical, family and social history reviewed and updated as indicated. Interim medical history since our last visit reviewed. Allergies and medications reviewed and updated.  Review of Systems  Constitutional: Negative for fever and chills.  HENT: Negative for congestion, ear discharge and ear pain.   Eyes: Negative for redness and visual disturbance.  Respiratory: Negative for chest tightness and shortness of breath.   Cardiovascular: Negative for chest pain and leg swelling.  Gastrointestinal: Negative for nausea and abdominal pain.  Genitourinary: Positive for dysuria and frequency. Negative for hematuria, flank pain, vaginal bleeding, vaginal discharge, difficulty urinating, vaginal pain, pelvic pain and dyspareunia.  Musculoskeletal: Negative for back pain and gait problem.  Skin: Negative for rash.  Neurological: Negative for light-headedness and headaches.  Psychiatric/Behavioral:  Negative for behavioral problems and agitation.  All other systems reviewed and are negative.   Per HPI unless specifically indicated above     Medication List       This list is accurate as of: 07/13/15  5:20 PM.  Always use your most recent med list.               cephALEXin 500 MG capsule  Commonly known as:  KEFLEX  Take 1 capsule (500 mg total) by mouth 4 (four) times daily.     cetirizine 10 MG tablet  Commonly known as:  ZYRTEC  Take 10 mg by mouth daily.     phenazopyridine 100 MG tablet  Commonly known as:  PYRIDIUM  Take 1 tablet (100 mg total) by mouth 3 (three) times daily as needed for pain.           Objective:    BP 103/69 mmHg  Pulse 82  Temp(Src) 98.1 F (36.7 C) (Oral)  Ht 5\' 3"  (1.6 m)  Wt 172 lb 9.6 oz (78.291 kg)  BMI 30.58 kg/m2  Wt Readings from Last 3 Encounters:  07/13/15 172 lb 9.6 oz (78.291 kg)  03/23/15 172 lb (78.019 kg)  10/26/14 168 lb 3.2 oz (76.295 kg)    Physical Exam  Constitutional: She is oriented to person, place, and time. She appears well-developed and well-nourished. No distress.  Eyes: Conjunctivae and EOM are normal. Pupils are equal, round, and reactive to light.  Cardiovascular: Normal rate and regular rhythm.   No murmur heard. Pulmonary/Chest: Effort normal and breath sounds  normal. No respiratory distress. She has no wheezes.  Abdominal: Soft. Bowel sounds are normal. She exhibits no distension. There is no tenderness. There is no rebound and no guarding.  Musculoskeletal: Normal range of motion. She exhibits no edema or tenderness.  Neurological: She is alert and oriented to person, place, and time. Coordination normal.  Skin: Skin is warm and dry. No rash noted. She is not diaphoretic.  Psychiatric: She has a normal mood and affect. Her behavior is normal.  Vitals reviewed.  patient did not want it genitourinary exam today.  Results for orders placed or performed in visit on 07/13/15  POCT urinalysis  dipstick  Result Value Ref Range   Color, UA YELLOW    Clarity, UA CLEAR    Glucose, UA NEG    Bilirubin, UA NEG    Ketones, UA NEG    Spec Grav, UA <=1.005    Blood, UA NEG    pH, UA 7.5    Protein, UA NEG    Urobilinogen, UA negative    Nitrite, UA NEG    Leukocytes, UA Trace (A) Negative  POCT UA - Microscopic Only  Result Value Ref Range   WBC, Ur, HPF, POC 10-15    RBC, urine, microscopic NEG    Bacteria, U Microscopic NEG    Mucus, UA NEG    Epithelial cells, urine per micros FEW    Crystals, Ur, HPF, POC NEG    Casts, Ur, LPF, POC NEG    Yeast, UA NEG       Assessment & Plan:   Problem List Items Addressed This Visit    None    Visit Diagnoses    Dysuria    -  Primary    Patient has dysuria, UA shows trace leukocytes but is otherwise normal. Because of her complaints of the symptoms we will treat also run a urine culture. We'll     Relevant Orders    Urine culture    POCT urinalysis dipstick (Completed)    POCT UA - Microscopic Only (Completed)        Follow up plan: Return if symptoms worsen or fail to improve.  Arville Care, MD [facility] 07/13/2015, 5:20 PM

## 2015-07-15 LAB — URINE CULTURE

## 2015-10-10 ENCOUNTER — Encounter: Payer: Self-pay | Admitting: Family

## 2015-10-10 ENCOUNTER — Ambulatory Visit (INDEPENDENT_AMBULATORY_CARE_PROVIDER_SITE_OTHER): Payer: BC Managed Care – PPO | Admitting: Family

## 2015-10-10 VITALS — BP 100/70 | HR 79 | Temp 97.5°F | Ht 63.0 in | Wt 176.8 lb

## 2015-10-10 DIAGNOSIS — H65192 Other acute nonsuppurative otitis media, left ear: Secondary | ICD-10-CM | POA: Diagnosis not present

## 2015-10-10 DIAGNOSIS — J309 Allergic rhinitis, unspecified: Secondary | ICD-10-CM

## 2015-10-10 MED ORDER — AMOXICILLIN-POT CLAVULANATE 875-125 MG PO TABS: 1.0000 | ORAL_TABLET | Freq: Two times a day (BID) | ORAL | Status: DC

## 2015-10-10 MED ORDER — FLUTICASONE PROPIONATE 50 MCG/ACT NA SUSP: 2.0000 | Freq: Every day | NASAL | Status: DC

## 2015-10-10 NOTE — Patient Instructions (Signed)

## 2015-10-10 NOTE — Progress Notes (Signed)
Subjective:    Patient ID: Kelly [name], female    DOB: 1986-07-30, 29 y.o.   MRN: [id number]  Otalgia  There is pain in the left ear. This is a new problem. The current episode started yesterday. The problem occurs every few minutes. The problem has been unchanged. There has been no fever. The pain is at a severity of 7/10. The pain is mild. Associated symptoms include ear discharge and headaches. Pertinent negatives include no coughing, hearing loss, rhinorrhea or sore throat. Treatments tried: zyrtec. The treatment provided mild relief.      Review of Systems  Constitutional: Negative.   HENT: Positive for ear discharge and ear pain. Negative for hearing loss, rhinorrhea and sore throat.   Eyes: Negative.   Respiratory: Negative.  Negative for cough and shortness of breath.   Cardiovascular: Negative.  Negative for palpitations.  Gastrointestinal: Negative.   Endocrine: Negative.   Genitourinary: Negative.   Musculoskeletal: Negative.   Neurological: Positive for headaches.  Hematological: Negative.   Psychiatric/Behavioral: Negative.   All other systems reviewed and are negative.      Objective:   Physical Exam  Constitutional: She is oriented to person, place, and time. She appears well-developed and well-nourished. No distress.  HENT:  Head: Normocephalic and atraumatic.  Right Ear: There is tenderness.  Left Ear: There is drainage, swelling and tenderness. Tympanic membrane is erythematous. A middle ear effusion is present.  Mouth/Throat: Oropharynx is clear and moist.  Eyes: Pupils are equal, round, and reactive to light.  Neck: Normal range of motion. Neck supple. No thyromegaly present.  Cardiovascular: Normal rate, regular rhythm, normal heart sounds and intact distal pulses.   No murmur heard. Pulmonary/Chest: Effort normal and breath sounds normal. No respiratory distress. She has no wheezes.  Abdominal: Soft. Bowel sounds are normal. She exhibits no  distension. There is no tenderness.  Musculoskeletal: Normal range of motion. She exhibits no edema or tenderness.  Neurological: She is alert and oriented to person, place, and time. She has normal reflexes. No cranial nerve deficit.  Skin: Skin is warm and dry.  Psychiatric: She has a normal mood and affect. Her behavior is normal. Judgment and thought content normal.  Vitals reviewed.     BP 100/70 mmHg  Pulse 79  Temp(Src) 97.5 F (36.4 C) (Oral)  Ht 5\' 3"  (1.6 m)  Wt 176 lb 12.8 oz (80.196 kg)  BMI 31.33 kg/m2     Assessment & Plan:  1. Allergic rhinitis, unspecified allergic rhinitis type - fluticasone (FLONASE) 50 MCG/ACT nasal spray; Place 2 sprays into both nostrils daily.  Dispense: 16 g; Refill: 6  2. Acute nonsuppurative otitis media of left ear -- Take meds as prescribed - Use a cool mist humidifier  -Use saline nose sprays frequently -Saline irrigations of the nose can be very helpful if done frequently.  * 4X daily for 1 week*  * Use of a nettie pot can be helpful with this. Follow directions with this* -Force fluids -For any cough or congestion  Use plain Mucinex- regular strength or max strength is fine   * Children- consult with Pharmacist for dosing -For fever or aces or pains- take tylenol or ibuprofen appropriate for age and weight.  * for fevers greater than 101 orally you may alternate ibuprofen and tylenol every  3 hours. -Throat lozenges if help - amoxicillin-clavulanate (AUGMENTIN) 875-125 MG tablet; Take 1 tablet by mouth 2 (two) times daily.  Dispense: 14 tablet; Refill: 0  Jannifer Rodneyhristy Narcissus Detwiler,  [name]  

## 2016-01-08 ENCOUNTER — Ambulatory Visit (INDEPENDENT_AMBULATORY_CARE_PROVIDER_SITE_OTHER): Payer: BC Managed Care – PPO | Admitting: Family Medicine

## 2016-01-08 ENCOUNTER — Encounter: Payer: Self-pay | Admitting: Family Medicine

## 2016-01-08 VITALS — BP 119/73 | HR 98 | Temp 97.4°F | Ht 63.0 in | Wt 180.4 lb

## 2016-01-08 DIAGNOSIS — R0602 Shortness of breath: Secondary | ICD-10-CM | POA: Diagnosis not present

## 2016-01-08 DIAGNOSIS — R002 Palpitations: Secondary | ICD-10-CM | POA: Diagnosis not present

## 2016-01-08 NOTE — Patient Instructions (Signed)
Great to meet you!  We can send in hydroxyzine if you change your mind Cut back on Caffeine    Palpitations A palpitation is the feeling that your heartbeat is irregular. It may feel like your heart is fluttering or skipping a beat. It may also feel like your heart is beating faster than normal. This is usually not a serious problem. In some cases, you may need more medical tests. HOME CARE  Avoid:  Caffeine in coffee, tea, soft drinks, diet pills, and energy drinks.  Chocolate.  Alcohol.  Stop smoking if you smoke.  Reduce your stress and anxiety. Try:  A method that measures bodily functions so you can learn to control them (biofeedback).  Yoga.  Meditation.  Physical activity such as swimming, jogging, or walking.  Get plenty of rest and sleep. GET HELP IF:  Your fast or irregular heartbeat continues after 24 hours.  Your palpitations occur more often. GET HELP RIGHT AWAY IF:   You have chest pain.  You feel short of breath.  You have a very bad headache.  You feel dizzy or pass out (faint). MAKE SURE YOU:   Understand these instructions.  Will watch your condition.  Will get help right away if you are not doing well or get worse.   This information is not intended to replace advice given to you by your health care provider. Make sure you discuss any questions you have with your health care provider.   Document Released: 08/27/2008 Document Revised: 12/09/2014 Document Reviewed: 01/17/2012 Elsevier Interactive Patient Education [employer]

## 2016-01-08 NOTE — Progress Notes (Signed)
   HPI  Patient presents today  Ears today with palpitations and shortness of breath.  She explains that last night when she laid down last night she had palps and mild SOB.  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   She denies any continued symptoms She has dry mouth for 1 week, takes zyrtec for allergies  No chest pain, no palps now, no dyspnea now   PMH: Smoking status noted ROS: Per HPI  Objective: BP 119/73 mmHg  Pulse 98  Temp(Src) 97.4 F (36.3 C) (Oral)  Ht  (1.6 m)  Wt 180 lb 6.4 oz (81.829 kg)  BMI 31.96 kg/m2  LMP 12/08/2015 Gen: NAD, alert, cooperative with exam HEENT: NCAT CV: RRR, good S1/S2, no murmur Resp: CTABL, no wheezes, non-labored Ext: No edema, warm Neuro: Alert and oriented, No gross deficits  Assessment and plan:  # Palpitations EKG nonspecific, symptoms resolved now Likely due to increased caffeine and stress Offered hydroxyzine. She declines F/u as needed   Murtis Sink, MD [facility] 01/08/2016, 10:56 AM

## 2016-06-24 ENCOUNTER — Encounter: Payer: BC Managed Care – PPO | Attending: Obstetrics and Gynecology | Admitting: Skilled Nursing Facility1

## 2016-06-24 DIAGNOSIS — Z713 Dietary counseling and surveillance: Secondary | ICD-10-CM | POA: Insufficient documentation

## 2016-06-24 DIAGNOSIS — O99810 Abnormal glucose complicating pregnancy: Secondary | ICD-10-CM | POA: Diagnosis present

## 2016-06-24 DIAGNOSIS — O24410 Gestational diabetes mellitus in pregnancy, diet controlled: Secondary | ICD-10-CM

## 2016-06-25 ENCOUNTER — Encounter: Payer: Self-pay | Admitting: Skilled Nursing Facility1

## 2016-06-25 NOTE — Progress Notes (Signed)
  Patient was seen on 06/25/2016 for Gestational Diabetes self-management class at the [facility]. The following learning objectives were met by the patient during this course:   States the definition of Gestational Diabetes  States why dietary management is important in controlling blood glucose  Describes the effects each nutrient has on blood glucose levels  Demonstrates ability to create a balanced meal plan  Demonstrates carbohydrate counting   States when to check blood glucose levels involving a total of 4 separate occurences in a day  Demonstrates proper blood glucose monitoring techniques  States the effect of stress and exercise on blood glucose levels  States the importance of limiting caffeine and abstaining from alcohol and smoking  Demonstrates the knowledge the glucometer provided in class may not be covered by their insurance and to call their insurance provider immediately after class to know which glucometer their insurance provider does cover as well as calling their physician the next day for a prescription to the glucometer their insurance does cover (if the one provided is not) as well as the lancets and strips for that meter.  Blood glucose monitor given: accucheck guide Lot # [id number] Exp: 06/18/17 Blood glucose reading: 88  Patient instructed to monitor glucose levels: FBS: 60 - <90 1 hour: <140 2 hour: <120  *Patient received handouts:  Nutrition Diabetes and Pregnancy  Carbohydrate Counting List  Patient will be seen for follow-up as needed.

## 2016-09-11 ENCOUNTER — Encounter (HOSPITAL_COMMUNITY): Payer: Self-pay | Admitting: *Deleted

## 2016-09-11 ENCOUNTER — Encounter (HOSPITAL_COMMUNITY)
Admission: RE | Admit: 2016-09-11 | Discharge: 2016-09-11 | Disposition: A | Discharge status: Home / Self Care | Payer: BC Managed Care – PPO | Source: Ambulatory Visit | Attending: Obstetrics and Gynecology | Admitting: Obstetrics and Gynecology

## 2016-09-11 LAB — BASIC METABOLIC PANEL
ANION GAP: 7 (ref 5–15)
BUN: 14 mg/dL (ref 6–20)
CALCIUM: 9.2 mg/dL (ref 8.9–10.3)
CO2: 22 mmol/L (ref 22–32)
Chloride: 105 mmol/L (ref 101–111)
Creatinine, Ser: 0.59 mg/dL (ref 0.44–1.00)
GFR calc Af Amer: >60 mL/min (ref >60)
GLUCOSE: 92 mg/dL (ref 65–99)
Potassium: 4 mmol/L (ref 3.5–5.1)
Sodium: 134 mmol/L — ABNORMAL LOW (ref 135–145)

## 2016-09-11 LAB — CBC
HCT: 39.4 % (ref 36.0–46.0)
HEMOGLOBIN: 13.8 g/dL (ref 12.0–15.0)
MCH: 31.4 pg (ref 26.0–34.0)
MCHC: 35 g/dL (ref 30.0–36.0)
MCV: 89.7 fL (ref 78.0–100.0)
PLATELETS: 173 10*3/uL (ref 150–400)
RBC: 4.39 MIL/uL (ref 3.87–5.11)
RDW: 14.8 % (ref 11.5–15.5)
WBC: 7.9 10*3/uL (ref 4.0–10.5)

## 2016-09-11 LAB — ABO/RH: ABO/RH(D): A POS

## 2016-09-11 LAB — TYPE AND SCREEN
ABO/RH(D): A POS
ANTIBODY SCREEN: NEGATIVE

## 2016-09-11 NOTE — Patient Instructions (Signed)
[name]  09/11/2016   Your procedure is scheduled on:  10.12.2017  [facility].  Pick up the phone at the desk and dial [phone].   Call this number if you have problems the morning of surgery: [phone]   Remember:   Do not eat food:After Midnight.  Do not drink clear liquids: After Midnight.  Take these medicines the morning of surgery with A SIP OF WATER: do not take glyburide.   You may take zyrtec if you usually take it in the morning   Do not wear jewelry, make-up or nail polish.  Do not wear lotions, powders, or perfumes. Do not wear deodorant.  Do not shave 48 hours prior to surgery.  Do not bring valuables to the hospital.  [facility] is not   responsible for any belongings or valuables brought to the hospital.  Contacts, dentures or bridgework may not be worn into surgery.  Leave suitcase in the car. After surgery it may be brought to your room.  For patients admitted to the hospital, checkout time is 11:00 AM the day of              discharge.   Patients discharged the day of surgery will not be allowed to drive             home.  Name and phone number of your driver: na Special Instructions:   N/A   Please read over the following fact sheets that you were given:   Surgical Site Infection Prevention

## 2016-09-11 NOTE — Anesthesia Preprocedure Evaluation (Addendum)
Anesthesia Evaluation  Patient identified by MRN, date of birth, ID band Patient awake    Reviewed: Allergy & Precautions, NPO status , Patient's Chart, lab work & pertinent test results  History of Anesthesia Complications Negative for: history of anesthetic complications  Airway Mallampati: III  TM Distance: >3 FB Neck ROM: Full    Dental  (+) Dental Advisory Given, Teeth Intact   Pulmonary neg pulmonary ROS,    Pulmonary exam normal breath sounds clear to auscultation       Cardiovascular negative cardio ROS   Rhythm:Regular Rate:Normal     Neuro/Psych negative neurological ROS     GI/Hepatic negative GI ROS, Neg liver ROS,   Endo/Other  diabetes, Gestational, Oral Hypoglycemic Agents  Renal/GU negative Renal ROS     Musculoskeletal   Abdominal (+) + obese,   Peds  Hematology negative hematology ROS (+)   Anesthesia Other Findings   Reproductive/Obstetrics (+) Pregnancy                            Anesthesia Physical Anesthesia Plan  ASA: II  Anesthesia Plan: Spinal   Post-op Pain Management:    Induction:   Airway Management Planned: Natural Airway  Additional Equipment:   Intra-op Plan:   Post-operative Plan:   Informed Consent: I have reviewed the patients History and Physical, chart, labs and discussed the procedure including the risks, benefits and alternatives for the proposed anesthesia with the patient or authorized representative who has indicated his/her understanding and acceptance.     Plan Discussed with: CRNA  Anesthesia Plan Comments: (I have discussed risks of neuraxial anesthesia including but not limited to infection, bleeding, nerve injury, back pain, headache, seizures, and failure of block. Patient denies bleeding disorders and is not currently anticoagulated. Labs have been reviewed. Risks and benefits discussed. All patient's questions answered.    Platelets 173)       Anesthesia Quick Evaluation

## 2016-09-11 NOTE — H&P (Addendum)
Harless LittenJennifer Bailey Elting is a 30 y.o. female presenting for c-section.  Pregnancy complicated by A2DM, well controlled.  US 09/10/16 shows infant @ 97% for growth.  Pt declines IOL/TOL.  OB History    No data available     No past medical history on file. Past Surgical History:  Procedure Laterality Date  . WISDOM TOOTH EXTRACTION     Family History: family history includes Cancer in her other; Healthy in her father and mother. Social History:  reports that she has never smoked. She has never used smokeless tobacco. She reports that she does not drink alcohol or use drugs.     Maternal Diabetes: Yes:  Diabetes Type:  Insulin/Medication controlled Genetic Screening: Normal Maternal Ultrasounds/Referrals: bilateral pyelectasis Fetal Ultrasounds or other Referrals:  None Maternal Substance Abuse:  No Significant Maternal Medications:  Meds include: Other: glyburide Significant Maternal Lab Results:  None Other Comments:  None  ROS History   AF, VSS Exam Physical Exam  gen - NAD Abd - gravid, NT Ext - NT, trace edema bilat Cvx closed, vtx floating Prenatal labs: ABO, Rh:  A+ Antibody:   Rubella:   RPR:    HBsAg:    HIV:    GBS:   + 08/13/16  US:  vtx 4398gms (97%) - AC @ 42wks.   AFI 17  Assessment/Plan: Primary c-section for LGA R/b/a discussed  Questions answered, informed consent  Shaquoia Miers 09/11/2016, 10:17 AM

## 2016-09-12 ENCOUNTER — Encounter (HOSPITAL_COMMUNITY)
Admission: AD | Disposition: A | Discharge status: Home / Self Care | Payer: Self-pay | Source: Ambulatory Visit | Attending: Obstetrics and Gynecology

## 2016-09-12 ENCOUNTER — Inpatient Hospital Stay (HOSPITAL_COMMUNITY): Payer: BC Managed Care – PPO | Admitting: Anesthesiology

## 2016-09-12 ENCOUNTER — Inpatient Hospital Stay (HOSPITAL_COMMUNITY)
Admission: AD | Admit: 2016-09-12 | Discharge: 2016-09-15 | DRG: 766 | Disposition: A | Discharge status: Home / Self Care | Payer: BC Managed Care – PPO | Source: Ambulatory Visit | Attending: Obstetrics and Gynecology | Admitting: Obstetrics and Gynecology

## 2016-09-12 ENCOUNTER — Encounter (HOSPITAL_COMMUNITY): Payer: Self-pay | Admitting: Emergency Medicine

## 2016-09-12 DIAGNOSIS — O24425 Gestational diabetes mellitus in childbirth, controlled by oral hypoglycemic drugs: Secondary | ICD-10-CM | POA: Diagnosis present

## 2016-09-12 DIAGNOSIS — O3663X0 Maternal care for excessive fetal growth, third trimester, not applicable or unspecified: Secondary | ICD-10-CM | POA: Diagnosis present

## 2016-09-12 DIAGNOSIS — Z3A39 39 weeks gestation of pregnancy: Secondary | ICD-10-CM | POA: Diagnosis not present

## 2016-09-12 DIAGNOSIS — O24419 Gestational diabetes mellitus in pregnancy, unspecified control: Secondary | ICD-10-CM | POA: Diagnosis present

## 2016-09-12 DIAGNOSIS — O99824 Streptococcus B carrier state complicating childbirth: Secondary | ICD-10-CM | POA: Diagnosis present

## 2016-09-12 LAB — GLUCOSE, CAPILLARY
Glucose-Capillary: 82 mg/dL (ref 65–99)
Glucose-Capillary: 78 mg/dL (ref 65–99)

## 2016-09-12 LAB — RPR: RPR: NONREACTIVE

## 2016-09-12 SURGERY — Surgical Case
Anesthesia: Spinal

## 2016-09-12 MED ORDER — SCOPOLAMINE 1 MG/3DAYS TD PT72: 1.0000 | MEDICATED_PATCH | Freq: Once | TRANSDERMAL | Status: DC

## 2016-09-12 MED ORDER — CEFAZOLIN SODIUM-DEXTROSE 2-4 GM/100ML-% IV SOLN
2.0000 g | INTRAVENOUS | Status: AC
  Administered 2016-09-12: 2 g via INTRAVENOUS

## 2016-09-12 MED ORDER — LACTATED RINGERS IV SOLN
Freq: Once | INTRAVENOUS | Status: AC
  Administered 2016-09-12: 07:00:00 via INTRAVENOUS

## 2016-09-12 MED ORDER — OXYCODONE-ACETAMINOPHEN 5-325 MG PO TABS
1.0000 | ORAL_TABLET | ORAL | Status: DC | PRN
  Administered 2016-09-12 – 2016-09-15 (×9): 1 via ORAL
  Filled 2016-09-12 (×10): qty 1

## 2016-09-12 MED ORDER — MEPERIDINE HCL 25 MG/ML IJ SOLN: 6.2500 mg | INTRAMUSCULAR | Status: DC | PRN

## 2016-09-12 MED ORDER — ZOLPIDEM TARTRATE 5 MG PO TABS: 5.0000 mg | ORAL_TABLET | Freq: Every evening | ORAL | Status: DC | PRN

## 2016-09-12 MED ORDER — SCOPOLAMINE 1 MG/3DAYS TD PT72
1.0000 | MEDICATED_PATCH | Freq: Once | TRANSDERMAL | Status: DC
  Administered 2016-09-12: 1.5 mg via TRANSDERMAL

## 2016-09-12 MED ORDER — OXYTOCIN 40 UNITS IN LACTATED RINGERS INFUSION - SIMPLE MED
2.5000 [IU]/h | INTRAVENOUS | Status: AC
Start: 2016-09-12 — End: 2016-09-13

## 2016-09-12 MED ORDER — DIBUCAINE 1 % RE OINT: 1.0000 "application " | TOPICAL_OINTMENT | RECTAL | Status: DC | PRN

## 2016-09-12 MED ORDER — OXYTOCIN 40 UNITS IN LACTATED RINGERS INFUSION - SIMPLE MED
INTRAVENOUS | Status: DC | PRN
  Administered 2016-09-12: 40 [IU] via INTRAVENOUS

## 2016-09-12 MED ORDER — ACETAMINOPHEN 325 MG PO TABS
650.0000 mg | ORAL_TABLET | ORAL | Status: DC | PRN
  Administered 2016-09-12 – 2016-09-14 (×2): 650 mg via ORAL
  Filled 2016-09-12 (×3): qty 2

## 2016-09-12 MED ORDER — KETOROLAC TROMETHAMINE 30 MG/ML IJ SOLN
30.0000 mg | Freq: Four times a day (QID) | INTRAMUSCULAR | Status: AC | PRN
  Administered 2016-09-12: 30 mg via INTRAVENOUS
  Filled 2016-09-12: qty 1

## 2016-09-12 MED ORDER — SIMETHICONE 80 MG PO CHEW: 80.0000 mg | CHEWABLE_TABLET | ORAL | Status: DC | PRN

## 2016-09-12 MED ORDER — SCOPOLAMINE 1 MG/3DAYS TD PT72
MEDICATED_PATCH | TRANSDERMAL | Status: AC
  Administered 2016-09-12: 1.5 mg via TRANSDERMAL
  Filled 2016-09-12: qty 1

## 2016-09-12 MED ORDER — IBUPROFEN 600 MG PO TABS
600.0000 mg | ORAL_TABLET | Freq: Four times a day (QID) | ORAL | Status: DC
  Administered 2016-09-12 – 2016-09-15 (×11): 600 mg via ORAL
  Filled 2016-09-12 (×11): qty 1

## 2016-09-12 MED ORDER — GLYCOPYRROLATE 0.2 MG/ML IJ SOLN
INTRAMUSCULAR | Status: DC | PRN
  Administered 2016-09-12: 0.1 mg via INTRAVENOUS

## 2016-09-12 MED ORDER — MEPERIDINE HCL 25 MG/ML IJ SOLN
INTRAMUSCULAR | Status: AC
  Filled 2016-09-12: qty 1

## 2016-09-12 MED ORDER — DIPHENHYDRAMINE HCL 25 MG PO CAPS: 25.0000 mg | ORAL_CAPSULE | ORAL | Status: DC | PRN

## 2016-09-12 MED ORDER — MEDROXYPROGESTERONE ACETATE 150 MG/ML IM SUSP: 150.0000 mg | INTRAMUSCULAR | Status: DC | PRN

## 2016-09-12 MED ORDER — SIMETHICONE 80 MG PO CHEW
80.0000 mg | CHEWABLE_TABLET | ORAL | Status: DC
  Administered 2016-09-12 – 2016-09-15 (×3): 80 mg via ORAL
  Filled 2016-09-12 (×3): qty 1

## 2016-09-12 MED ORDER — BUPIVACAINE IN DEXTROSE 0.75-8.25 % IT SOLN
INTRATHECAL | Status: DC | PRN
  Administered 2016-09-12: 1.6 mL via INTRATHECAL

## 2016-09-12 MED ORDER — NALBUPHINE HCL 10 MG/ML IJ SOLN
INTRAMUSCULAR | Status: AC
  Filled 2016-09-12: qty 1

## 2016-09-12 MED ORDER — PHENYLEPHRINE HCL 10 MG/ML IJ SOLN
INTRAMUSCULAR | Status: DC | PRN
  Administered 2016-09-12: 80 ug via INTRAVENOUS

## 2016-09-12 MED ORDER — LACTATED RINGERS IV SOLN
INTRAVENOUS | Status: DC | PRN
  Administered 2016-09-12 (×3): via INTRAVENOUS

## 2016-09-12 MED ORDER — COCONUT OIL OIL
1.0000 "application " | TOPICAL_OIL | Status: DC | PRN
  Administered 2016-09-13: 1 via TOPICAL
  Filled 2016-09-12: qty 120

## 2016-09-12 MED ORDER — SIMETHICONE 80 MG PO CHEW
80.0000 mg | CHEWABLE_TABLET | Freq: Three times a day (TID) | ORAL | Status: DC
  Administered 2016-09-12 – 2016-09-15 (×8): 80 mg via ORAL
  Filled 2016-09-12 (×9): qty 1

## 2016-09-12 MED ORDER — SODIUM CHLORIDE 0.9% FLUSH
3.0000 mL | INTRAVENOUS | Status: DC | PRN
Start: 2016-09-12 — End: 2016-09-15

## 2016-09-12 MED ORDER — PHENYLEPHRINE 8 MG IN D5W 100 ML (0.08MG/ML) PREMIX OPTIME
INJECTION | INTRAVENOUS | Status: AC
  Filled 2016-09-12: qty 100

## 2016-09-12 MED ORDER — MEPERIDINE HCL 25 MG/ML IJ SOLN
INTRAMUSCULAR | Status: DC | PRN
  Administered 2016-09-12 (×2): 12.5 mg via INTRAVENOUS

## 2016-09-12 MED ORDER — MORPHINE SULFATE-NACL 0.5-0.9 MG/ML-% IV SOSY
PREFILLED_SYRINGE | INTRAVENOUS | Status: DC | PRN
  Administered 2016-09-12: .1 mg via INTRATHECAL

## 2016-09-12 MED ORDER — ONDANSETRON HCL 4 MG/2ML IJ SOLN
INTRAMUSCULAR | Status: DC | PRN
  Administered 2016-09-12: 4 mg via INTRAVENOUS

## 2016-09-12 MED ORDER — NALBUPHINE HCL 10 MG/ML IJ SOLN: 5.0000 mg | Freq: Once | INTRAMUSCULAR | Status: DC | PRN

## 2016-09-12 MED ORDER — WITCH HAZEL-GLYCERIN EX PADS: 1.0000 "application " | MEDICATED_PAD | CUTANEOUS | Status: DC | PRN

## 2016-09-12 MED ORDER — MORPHINE SULFATE-NACL 0.5-0.9 MG/ML-% IV SOSY
PREFILLED_SYRINGE | INTRAVENOUS | Status: AC
Start: 2016-09-12 — End: 2016-09-12
  Filled 2016-09-12: qty 1

## 2016-09-12 MED ORDER — DIPHENHYDRAMINE HCL 50 MG/ML IJ SOLN: 12.5000 mg | INTRAMUSCULAR | Status: DC | PRN

## 2016-09-12 MED ORDER — MENTHOL 3 MG MT LOZG: 1.0000 | LOZENGE | OROMUCOSAL | Status: DC | PRN

## 2016-09-12 MED ORDER — OXYCODONE-ACETAMINOPHEN 5-325 MG PO TABS: 2.0000 | ORAL_TABLET | ORAL | Status: DC | PRN

## 2016-09-12 MED ORDER — SENNOSIDES-DOCUSATE SODIUM 8.6-50 MG PO TABS
2.0000 | ORAL_TABLET | ORAL | Status: DC
  Administered 2016-09-12 – 2016-09-15 (×3): 2 via ORAL
  Filled 2016-09-12 (×3): qty 2

## 2016-09-12 MED ORDER — PHENYLEPHRINE 8 MG IN D5W 100 ML (0.08MG/ML) PREMIX OPTIME
INJECTION | INTRAVENOUS | Status: DC | PRN
  Administered 2016-09-12: 80 ug/min via INTRAVENOUS
  Administered 2016-09-12: 60 ug/min via INTRAVENOUS

## 2016-09-12 MED ORDER — OXYTOCIN 10 UNIT/ML IJ SOLN
INTRAMUSCULAR | Status: AC
  Filled 2016-09-12: qty 4

## 2016-09-12 MED ORDER — NALBUPHINE HCL 10 MG/ML IJ SOLN
5.0000 mg | INTRAMUSCULAR | Status: DC | PRN
  Administered 2016-09-12: 5 mg via INTRAVENOUS

## 2016-09-12 MED ORDER — PRENATAL MULTIVITAMIN CH
1.0000 | ORAL_TABLET | Freq: Every day | ORAL | Status: DC
  Administered 2016-09-13 – 2016-09-15 (×3): 1 via ORAL
  Filled 2016-09-12 (×3): qty 1

## 2016-09-12 MED ORDER — ONDANSETRON HCL 4 MG/2ML IJ SOLN
INTRAMUSCULAR | Status: AC
  Filled 2016-09-12: qty 2

## 2016-09-12 MED ORDER — KETOROLAC TROMETHAMINE 30 MG/ML IJ SOLN
INTRAMUSCULAR | Status: AC
  Filled 2016-09-12: qty 1

## 2016-09-12 MED ORDER — FENTANYL CITRATE (PF) 100 MCG/2ML IJ SOLN
INTRAMUSCULAR | Status: DC | PRN
  Administered 2016-09-12: 15 ug via INTRATHECAL

## 2016-09-12 MED ORDER — PROMETHAZINE HCL 25 MG/ML IJ SOLN: 6.2500 mg | INTRAMUSCULAR | Status: DC | PRN

## 2016-09-12 MED ORDER — NALOXONE HCL 0.4 MG/ML IJ SOLN: 0.4000 mg | INTRAMUSCULAR | Status: DC | PRN

## 2016-09-12 MED ORDER — KETOROLAC TROMETHAMINE 30 MG/ML IJ SOLN
30.0000 mg | Freq: Four times a day (QID) | INTRAMUSCULAR | Status: AC | PRN
  Administered 2016-09-12: 30 mg via INTRAMUSCULAR

## 2016-09-12 MED ORDER — DEXTROSE IN LACTATED RINGERS 5 % IV SOLN
INTRAVENOUS | Status: DC
  Administered 2016-09-12: 15:00:00 via INTRAVENOUS

## 2016-09-12 MED ORDER — NALOXONE HCL 2 MG/2ML IJ SOSY
1.0000 ug/kg/h | PREFILLED_SYRINGE | INTRAVENOUS | Status: DC | PRN
  Filled 2016-09-12: qty 2

## 2016-09-12 MED ORDER — TETANUS-DIPHTH-ACELL PERTUSSIS 5-2.5-18.5 LF-MCG/0.5 IM SUSP: 0.5000 mL | Freq: Once | INTRAMUSCULAR | Status: DC

## 2016-09-12 MED ORDER — SODIUM CHLORIDE 0.9 % IR SOLN
Status: DC | PRN
  Administered 2016-09-12: 1

## 2016-09-12 MED ORDER — FENTANYL CITRATE (PF) 100 MCG/2ML IJ SOLN
INTRAMUSCULAR | Status: AC
  Filled 2016-09-12: qty 2

## 2016-09-12 MED ORDER — PHENYLEPHRINE 40 MCG/ML (10ML) SYRINGE FOR IV PUSH (FOR BLOOD PRESSURE SUPPORT)
PREFILLED_SYRINGE | INTRAVENOUS | Status: AC
  Filled 2016-09-12: qty 10

## 2016-09-12 MED ORDER — ONDANSETRON HCL 4 MG/2ML IJ SOLN: 4.0000 mg | Freq: Three times a day (TID) | INTRAMUSCULAR | Status: DC | PRN

## 2016-09-12 MED ORDER — MEASLES, MUMPS & RUBELLA VAC ~~LOC~~ INJ: 0.5000 mL | INJECTION | Freq: Once | SUBCUTANEOUS | Status: DC

## 2016-09-12 MED ORDER — NALBUPHINE HCL 10 MG/ML IJ SOLN: 5.0000 mg | INTRAMUSCULAR | Status: DC | PRN

## 2016-09-12 MED ORDER — LACTATED RINGERS IV SOLN
INTRAVENOUS | Status: DC
  Administered 2016-09-12: 08:00:00 via INTRAVENOUS

## 2016-09-12 MED ORDER — DIPHENHYDRAMINE HCL 25 MG PO CAPS
25.0000 mg | ORAL_CAPSULE | Freq: Four times a day (QID) | ORAL | Status: DC | PRN
  Administered 2016-09-15: 25 mg via ORAL
  Filled 2016-09-12: qty 1

## 2016-09-12 SURGICAL SUPPLY — 29 items
CHLORAPREP W/TINT 26ML (MISCELLANEOUS) ×3 IMPLANT
CLAMP CORD UMBIL (MISCELLANEOUS) IMPLANT
CLOTH BEACON ORANGE TIMEOUT ST (SAFETY) ×3 IMPLANT
DRSG OPSITE POSTOP 4X10 (GAUZE/BANDAGES/DRESSINGS) ×3 IMPLANT
ELECT REM PT RETURN 9FT ADLT (ELECTROSURGICAL) ×3
ELECTRODE REM PT RTRN 9FT ADLT (ELECTROSURGICAL) ×1 IMPLANT
EXTRACTOR VACUUM M CUP 4 TUBE (SUCTIONS) ×2 IMPLANT
EXTRACTOR VACUUM M CUP 4' TUBE (SUCTIONS) ×1
GLOVE BIO SURGEON STRL SZ 6.5 (GLOVE) ×2 IMPLANT
GLOVE BIO SURGEONS STRL SZ 6.5 (GLOVE) ×1
GLOVE BIOGEL PI IND STRL 7.0 (GLOVE) ×2 IMPLANT
GLOVE BIOGEL PI INDICATOR 7.0 (GLOVE) ×4
GOWN STRL REUS W/TWL LRG LVL3 (GOWN DISPOSABLE) ×6 IMPLANT
KIT ABG SYR 3ML LUER SLIP (SYRINGE) IMPLANT
LIQUID BAND (GAUZE/BANDAGES/DRESSINGS) ×3 IMPLANT
NEEDLE HYPO 25X5/8 SAFETYGLIDE (NEEDLE) IMPLANT
NS IRRIG 1000ML POUR BTL (IV SOLUTION) ×3 IMPLANT
PACK C SECTION WH (CUSTOM PROCEDURE TRAY) ×3 IMPLANT
PAD OB MATERNITY 4.3X12.25 (PERSONAL CARE ITEMS) ×3 IMPLANT
PENCIL SMOKE EVAC W/HOLSTER (ELECTROSURGICAL) ×3 IMPLANT
SUT CHROMIC 0 CT 802H (SUTURE) IMPLANT
SUT CHROMIC 0 CTX 36 (SUTURE) ×9 IMPLANT
SUT MON AB-0 CT1 36 (SUTURE) ×3 IMPLANT
SUT PDS AB 0 CTX 60 (SUTURE) ×3 IMPLANT
SUT PLAIN 0 NONE (SUTURE) IMPLANT
SUT VIC AB 4-0 KS 27 (SUTURE) IMPLANT
SYR BULB 3OZ (MISCELLANEOUS) ×3 IMPLANT
TOWEL OR 17X24 6PK STRL BLUE (TOWEL DISPOSABLE) ×3 IMPLANT
TRAY FOLEY CATH SILVER 14FR (SET/KITS/TRAYS/PACK) IMPLANT

## 2016-09-12 NOTE — Anesthesia Postprocedure Evaluation (Signed)
Anesthesia Post Note  Patient: Harless LittenJennifer Bailey Foisy  Procedure(s) Performed: Procedure(s) (LRB): CESAREAN SECTION (N/A)  Patient location during evaluation: PACU Anesthesia Type: Spinal Level of consciousness: oriented and awake and alert Pain management: pain level controlled Vital Signs Assessment: post-procedure vital signs reviewed and stable Respiratory status: spontaneous breathing, respiratory function stable and nonlabored ventilation Cardiovascular status: blood pressure returned to baseline and stable Postop Assessment: no headache and no backache Anesthetic complications: no     Last Vitals:  Vitals:   09/12/16 0930 09/12/16 0945  BP: 95/61 100/63  Pulse: 66 67  Resp: 17 17  Temp:      Last Pain:  Vitals:   09/12/16 0945  TempSrc:   PainSc: 4    Pain Goal: Patients Stated Pain Goal: 4 (09/12/16 0606)               Linton RumpJennifer Dickerson Coren Sagan

## 2016-09-12 NOTE — Anesthesia Postprocedure Evaluation (Signed)
Anesthesia Post Note  Patient: Kelly LittenJennifer Bailey [name]  Procedure(s) Performed: Procedure(s) (LRB): CESAREAN SECTION (N/A)  Patient location during evaluation: Mother Baby Anesthesia Type: Spinal Level of consciousness: awake, awake and alert, oriented and patient cooperative Pain management: pain level controlled Vital Signs Assessment: post-procedure vital signs reviewed and stable Respiratory status: spontaneous breathing, nonlabored ventilation and respiratory function stable Cardiovascular status: stable Postop Assessment: no headache, no backache, no signs of nausea or vomiting and patient able to bend at knees Anesthetic complications: no     Last Vitals:  Vitals:   09/12/16 1031 09/12/16 1145  BP: (!) 100/55 (!) 95/53  Pulse: 60 60  Resp:    Temp: 36.3 C 36.6 C    Last Pain:  Vitals:   09/12/16 1145  TempSrc: Oral  PainSc: 3    Pain Goal: Patients Stated Pain Goal: 3 (09/12/16 1030)               Sandro Burgo L

## 2016-09-12 NOTE — Op Note (Signed)
Cesarean Section Procedure Note   Kelly LittenJennifer Bailey Matt  09/12/2016  Indications: suspected LGA, A2DM   Pre-operative Diagnosis: PRIMARY LGA, gestaional diabetes.   Post-operative Diagnosis: Same   Surgeon: Surgeon(s) and Role:    * Zelphia CairoGretchen George Alcantar, MD - Primary   Assistants: Mamie Leversracy Tucker  Anesthesia: spinal   Procedure Details:  The patient was seen in the Holding Room. The risks, benefits, complications, treatment options, and expected outcomes were discussed with the patient. The patient concurred with the proposed plan, giving informed consent. identified as Kelly [name] and the procedure verified as C-Section Delivery. A Time Out was held and the above information confirmed.  After induction of anesthesia, the patient was draped and prepped in the usual sterile manner. A transverse was made and carried down through the subcutaneous tissue to the fascia. Fascial incision was made and extended transversely. The fascia was separated from the underlying rectus tissue superiorly and inferiorly. The peritoneum was identified and entered. Peritoneal incision was extended longitudinally. The utero-vesical peritoneal reflection was incised transversely and the bladder flap was bluntly freed from the lower uterine segment. A low transverse uterine incision was made. Delivered from cephalic presentation using vacuum assist was a vigorous female. Cord ph was not sent the umbilical cord was clamped and cut cord blood was obtained for evaluation. The placenta was removed Intact and appeared normal. The uterine outline, tubes and ovaries appeared normal}. The uterine incision was closed with running locked sutures of 0chromic gut.   Hemostasis was observed. Lavage was carried out until clear. The fascia was then reapproximated with running sutures of 0PDS.  The skin was closed with 4-0Vicryl.   Instrument, sponge, and needle counts were correct prior the abdominal closure and were correct at  the conclusion of the case.     Estimated Blood Loss: 500cc   Urine Output: clear  Specimens: @ORSPECIMEN @   Complications: no complications  Disposition: PACU - hemodynamically stable.   Maternal Condition: stable   Baby condition / location:  Couplet care / Skin to Skin  Attending Attestation: I was present and scrubbed for the entire procedure.   Signed: Surgeon(s): Zelphia CairoGretchen Tere Mcconaughey, MD

## 2016-09-12 NOTE — Consult Note (Signed)
Neonatology Note:   Attendance at C-section:    I was asked by Dr. Adkins to attend this primary C/S at term due to fetal macrosomia. The mother is a G2P0A1 A pos, GBS pos with A2DM, on Glyburide. ROM at delivery, fluid clear. Infant vigorous with good spontaneous cry and tone. Delayed cord clamping was done. Needed no suctioning. Ap 9/9. Lungs clear to ausc in DR. To CN to care of Pediatrician.   Kelly Stjames C. Dineen Conradt, MD 

## 2016-09-12 NOTE — Lactation Note (Signed)
This note was copied from a baby's chart. Lactation Consultation Note  Patient Name: Kelly [name] GNFAO'ZToday's Date: 09/12/2016 Reason for consult: Initial assessment Baby at 8 hr of life. Mom denies breast or nipple pain. Discussed baby behavior, feeding frequency, baby belly size, voids, wt loss, breast changes, pumping, and nipple care. Demonstrated manual expression, colostrum noted bilaterally, spoon in room. Given lactation handouts. Aware of OP services and support group. Mom will call as needed.      Maternal Data Has patient been taught Hand Expression?: Yes Does the patient have breastfeeding experience prior to this delivery?: No  Feeding Feeding Type: Breast Fed Length of feed: 8 min  LATCH Score/Interventions Latch: Grasps breast easily, tongue down, lips flanged, rhythmical sucking.  Audible Swallowing: A few with stimulation Intervention(s): Skin to skin;Hand expression;Alternate breast massage  Type of Nipple: Flat Intervention(s): No intervention needed  Comfort (Breast/Nipple): Soft / non-tender     Hold (Positioning): Full assist, staff holds infant at breast Intervention(s): Support Pillows;Position options  LATCH Score: 6  Lactation Tools Discussed/Used WIC Program: No   Consult Status Consult Status: Follow-up Date: 09/13/16 Follow-up type: In-patient    [name] 09/12/2016, 4:30 PM

## 2016-09-12 NOTE — Anesthesia Procedure Notes (Signed)
Spinal  Patient location during procedure: OR Start time: 09/12/2016 7:28 AM End time: 09/12/2016 7:31 AM Staffing Anesthesiologist: Linton RumpALLAN, Marjorie DICKERSON Performed: anesthesiologist  Preanesthetic Checklist Completed: patient identified, surgical consent, pre-op evaluation, timeout performed, IV checked, risks and benefits discussed and monitors and equipment checked Spinal Block Patient position: sitting Prep: DuraPrep Patient monitoring: heart rate, cardiac monitor, continuous pulse ox and blood pressure Approach: midline Location: L2-3 Injection technique: single-shot Needle Needle type: Pencan  Needle gauge: 24 G Needle length: 9 cm Assessment Sensory level: T4 Additional Notes No complications with block placement.

## 2016-09-12 NOTE — Transfer of Care (Signed)
Immediate Anesthesia Transfer of Care Note  Patient: Harless LittenJennifer Bailey Chevez  Procedure(s) Performed: Procedure(s) with comments: CESAREAN SECTION (N/A) - RNFA  Patient Location: PACU  Anesthesia Type:Spinal  Level of Consciousness: awake, alert  and oriented  Airway & Oxygen Therapy: Patient Spontanous Breathing  Post-op Assessment: Report given to RN and Post -op Vital signs reviewed and stable  Post vital signs: Reviewed and stable  Last Vitals:  Vitals:   09/12/16 0606 09/12/16 0845  BP: 122/84 117/73  Pulse: 86 72  Resp: 16   Temp: 36.7 C     Last Pain:  Vitals:   09/12/16 0606  TempSrc: Oral      Patients Stated Pain Goal: 4 (09/12/16 0606)  Complications: No apparent anesthesia complications

## 2016-09-12 NOTE — Addendum Note (Signed)
Addendum  created 09/12/16 1335 by Yolonda KidaAlison L Daisuke Bailey, CRNA   Sign clinical note

## 2016-09-13 ENCOUNTER — Encounter (HOSPITAL_COMMUNITY): Payer: Self-pay | Admitting: Obstetrics and Gynecology

## 2016-09-13 LAB — BIRTH TISSUE RECOVERY COLLECTION (PLACENTA DONATION)

## 2016-09-13 LAB — CBC
HCT: 32 % — ABNORMAL LOW (ref 36.0–46.0)
Hemoglobin: 10.9 g/dL — ABNORMAL LOW (ref 12.0–15.0)
MCH: 31 pg (ref 26.0–34.0)
MCHC: 34.1 g/dL (ref 30.0–36.0)
MCV: 90.9 fL (ref 78.0–100.0)
PLATELETS: 116 10*3/uL — AB (ref 150–400)
RBC: 3.52 MIL/uL — ABNORMAL LOW (ref 3.87–5.11)
RDW: 15.2 % (ref 11.5–15.5)
WBC: 8.2 10*3/uL (ref 4.0–10.5)

## 2016-09-13 NOTE — Progress Notes (Signed)
Subjective: Postpartum Day 1: Cesarean Delivery Patient reports tolerating PO, + flatus and no problems voiding.    Objective: Vital signs in last 24 hours: Temp:  [97.3 F (36.3 C)-98.3 F (36.8 C)] 98.3 F (36.8 C) (10/13 0540) Pulse Rate:  [59-72] 65 (10/13 0540) Resp:  [16-20] 18 (10/12 1705) BP: (95-118)/(52-76) 100/53 (10/13 0540) SpO2:  [91 %-100 %] 100 % (10/12 1705)  Physical Exam:  General: alert and cooperative Lochia: appropriate Uterine Fundus: firm Incision: healing well DVT Evaluation: No evidence of DVT seen on physical exam. Negative Homan's sign. No cords or calf tenderness. No significant calf/ankle edema.   Recent Labs  09/11/16 1300 09/13/16 0555  HGB 13.8 10.9*  HCT 39.4 32.0*    Assessment/Plan: Status post Cesarean section. Doing well postoperatively.  Continue current care.  CURTIS,CAROL G 09/13/2016, 8:02 AM

## 2016-09-13 NOTE — Lactation Note (Signed)
This note was copied from a baby's chart. Lactation Consultation Note  Patient Name: Kelly Charlies SilversJennifer Haworth ZOXWR'UToday's Date: 09/13/2016 Reason for consult: Follow-up assessment Baby at 37 hr of life. Mom is reporting bilateral nipple soreness. The R breast has a bruise on the areola at 10-11 o'clock with a bruised nipple face. The L breast has a bruise on the areola at 1-2 o'clock with a bruise nipple face. Given comfort gels and nipple care review. Mom is holding baby in cradle position because it is most comfortable for her. Suggested she un swaddle baby and bring him closer to the breast to get a deeper latch. Discussed baby behavior, feeding frequency, baby belly size, voids, wt loss, and breast changes. Mom is aware of lactation services and support group. She will call as needed.    Maternal Data    Feeding Feeding Type: Breast Fed Length of feed: 5 min  LATCH Score/Interventions Latch: Grasps breast easily, tongue down, lips flanged, rhythmical sucking. Intervention(s): Breast compression;Breast massage  Audible Swallowing: None  Type of Nipple: Everted at rest and after stimulation  Comfort (Breast/Nipple): Filling, red/small blisters or bruises, mild/mod discomfort  Problem noted: Mild/Moderate discomfort;Cracked, bleeding, blisters, bruises Interventions  (Cracked/bleeding/bruising/blister): Expressed breast milk to nipple Interventions (Mild/moderate discomfort): Comfort gels  Hold (Positioning): Assistance needed to correctly position infant at breast and maintain latch. Intervention(s): Support Pillows;Position options  LATCH Score: 6  Lactation Tools Discussed/Used     Consult Status Consult Status: Follow-up Date: 09/14/16 Follow-up type: In-patient    Rulon Eisenmengerlizabeth E Lakeidra Reliford 09/13/2016, 9:21 PM

## 2016-09-14 NOTE — Progress Notes (Signed)
Subjective: Postpartum Day 2: Cesarean Delivery Patient reports tolerating PO, + flatus and no problems voiding.    Objective: Vital signs in last 24 hours: Temp:  [97.5 F (36.4 C)-98.6 F (37 C)] 97.5 F (36.4 C) (10/14 0536) Pulse Rate:  [63-73] 63 (10/14 0536) Resp:  [16-18] 16 (10/14 0536) BP: (97-108)/(43-60) 97/43 (10/14 0536)  Physical Exam:  General: alert, cooperative, appears stated age and mild distress Lochia: appropriate Uterine Fundus: firm Incision: healing well DVT Evaluation: No evidence of DVT seen on physical exam.   Recent Labs  09/11/16 1300 09/13/16 0555  HGB 13.8 10.9*  HCT 39.4 32.0*    Assessment/Plan: Status post Cesarean section. Doing well postoperatively.  Continue current care.  Eva Griffo C 09/14/2016, 9:40 AM

## 2016-09-14 NOTE — Lactation Note (Signed)
This note was copied from a baby's chart. Lactation Consultation Note  Patient Name: Kelly [name] BJYNW'GToday's Date: 09/14/2016 Reason for consult: Follow-up assessment Infant is 8256 hours old & [facility]. [facility] & mom reports she had just finished BF on left breast for ~25 mins. Mom reports having sore nipples & has bruising; mom reports she has been using coconut oil & comfort gels and those are helping. Mom latched baby on right breast in cross-cradle hold & baby latched right away; mom reported a little pinching & baby's body was not fully facing mom but more the ceiling. Suggested mom roll baby so his full body was facing her & showed mom how to flip out his upper lip; mom reported that it was more comfortable with those adjustments. Also showed mom how to apply a little pressure on his chin while latched to achieve a deeper latch. Mom's nipple was slightly squished after baby unlatched so stressed importance of ensuring baby's mouth is very wide before latching. Mom encouraged to feed baby 8-12 times/24 hours and with feeding cues. Mom reports that baby is about to get circumcised; LC discussed how this can make babies sleepy so if he hasn't woken up after ~3.5hrs after previous feeding, she can put him skin-to-skin and she could also do some hand expression into a spoon to feed him a little to see if those will help him wake up. Mom reports no questions at this time. Encouraged mom to ask for LC at a future feeding if she needs assistance.   Maternal Data    Feeding Feeding Type: Breast Fed Length of feed: 45 min (per mom)  LATCH Score/Interventions Latch: Grasps breast easily, tongue down, lips flanged, rhythmical sucking. Intervention(s): Adjust position;Breast compression  Audible Swallowing: A few with stimulation Intervention(s): Hand expression;Skin to skin  Type of Nipple: Everted at rest and after  stimulation  Comfort (Breast/Nipple): Filling, red/small blisters or bruises, mild/mod discomfort  Problem noted: Cracked, bleeding, blisters, bruises (bruising) Interventions  (Cracked/bleeding/bruising/blister): Expressed breast milk to nipple;Other (comment) (coconut & comfort gels previously given) Interventions (Mild/moderate discomfort): Comfort gels  Hold (Positioning): Assistance needed to correctly position infant at breast and maintain latch. Intervention(s): Breastfeeding basics reviewed;Position options;Skin to skin  LATCH Score: 7  Lactation Tools Discussed/Used     Consult Status Consult Status: Follow-up Date: 09/15/16 Follow-up type: In-patient    Oneal GroutLaura C Corrisa Gibby 09/14/2016, 4:38 PM

## 2016-09-15 MED ORDER — OXYCODONE-ACETAMINOPHEN 5-325 MG PO TABS: 1.0000 | ORAL_TABLET | ORAL | 0 refills | Status: DC | PRN

## 2016-09-15 MED ORDER — IBUPROFEN 600 MG PO TABS: 600.0000 mg | ORAL_TABLET | Freq: Four times a day (QID) | ORAL | 0 refills | Status: DC

## 2016-09-15 NOTE — Lactation Note (Signed)
This note was copied from a baby's chart. Lactation Consultation Note  Patient Name: Kelly [name]: 09/15/2016 Reason for consult: Follow-up assessment;Infant weight loss Mom called for LC to observe feeding. Baby eager at breast, [name], breast compression and un-tucking lower lip for more depth with latch. Baby does a lot of chewing at breast. When initially latched on left breast, noticed few audible swallows but not many throughout the feeding. On right breast, 1-2 noted swallow audible with stethoscope while nursing. Again a lot of chewing at breast. Per chart review, baby has been to breast 9 times for 30-40 minutes in past 24 hours. 3 voids in past 24 hours/8 in life, 5 stools in 24 hours/18 in life. Weight loss 9.8%. Reflecting a 2.6% weight loss in past 24 hours. Encouraged Mom to continue to BF with feeding ques, baby should be at breast 8-12 times in 24 hours. Keep baby nursing 15-20 minutes both breasts most feedings. Mom has DEBP at home, encouraged to post pump for 15 minutes during the day/evening and give baby back any amount of EBM she receives. If baby not having adequate output, either not waking to BF or wanting to be on breast all the time, then advised to supplement with EBM/formula per guidelines per hours of age, hand out given. Advised to refer to Baby N Me booklet page 24 for Engorgement care and output chart, page 25 for breast milk storage guidelines. Advised of OP services and support group. Encouraged to call for questions/concerns.   Maternal Data    Feeding Feeding Type: Breast Fed Length of feed: 35 min  LATCH Score/Interventions Latch: Grasps breast easily, tongue down, lips flanged, rhythmical sucking. Intervention(s): Adjust position;Assist with latch;Breast massage;Breast compression  Audible Swallowing: A few with stimulation  Type of Nipple: Everted at rest and after stimulation  Comfort (Breast/Nipple):  Filling, red/small blisters or bruises, mild/mod discomfort  Problem noted: Mild/Moderate discomfort;Cracked, bleeding, blisters, bruises Interventions  (Cracked/bleeding/bruising/blister): Expressed breast milk to nipple  Hold (Positioning): Assistance needed to correctly position infant at breast and maintain latch.  LATCH Score: 7  Lactation Tools Discussed/Used     Consult Status Consult Status: Complete [name]: 09/15/16 Follow-up type: In-patient    [name], [name] 09/15/2016, 11:08 AM

## 2016-09-15 NOTE — Discharge Summary (Signed)
Obstetric Discharge Summary Reason for Admission: cesarean section Prenatal Procedures: none Intrapartum Procedures: cesarean: low cervical, transverse Postpartum Procedures: none Complications-Operative and Postpartum: none Hemoglobin  Date Value Ref Range Status  09/13/2016 10.9 (L) 12.0 - 15.0 g/dL Final    Comment:    DELTA CHECK NOTED REPEATED TO VERIFY    HCT  Date Value Ref Range Status  09/13/2016 32.0 (L) 36.0 - 46.0 % Final    Physical Exam:  General: alert, cooperative, appears stated age and mild distress Lochia: appropriate Uterine Fundus: firm Incision: healing well DVT Evaluation: No evidence of DVT seen on physical exam.  Discharge Diagnoses: Term Pregnancy-delivered  Discharge Information: Date: 09/15/2016 Activity: pelvic rest Diet: routine Medications: Ibuprofen and Percocet Condition: stable Instructions: refer to practice specific booklet Discharge to: home   Newborn Data: Live born female  Birth Weight: 9 lb 1.3 oz (4120 g) APGAR: 9, 9  Home with mother.  Yovany Clock C 09/15/2016, 8:49 AM

## 2016-09-28 ENCOUNTER — Ambulatory Visit (INDEPENDENT_AMBULATORY_CARE_PROVIDER_SITE_OTHER): Payer: BC Managed Care – PPO | Admitting: Physician Assistant

## 2016-09-28 ENCOUNTER — Encounter: Payer: Self-pay | Admitting: Physician Assistant

## 2016-09-28 VITALS — BP 92/60 | HR 70 | Temp 97.3°F | Ht 64.0 in | Wt 169.6 lb

## 2016-09-28 DIAGNOSIS — R399 Unspecified symptoms and signs involving the genitourinary system: Secondary | ICD-10-CM | POA: Diagnosis not present

## 2016-09-28 DIAGNOSIS — N3001 Acute cystitis with hematuria: Secondary | ICD-10-CM | POA: Diagnosis not present

## 2016-09-28 MED ORDER — AMOXICILLIN 500 MG PO CAPS: 500.0000 mg | ORAL_CAPSULE | Freq: Three times a day (TID) | ORAL | 0 refills | Status: DC

## 2016-09-28 MED ORDER — FLUCONAZOLE 150 MG PO TABS: 150.0000 mg | ORAL_TABLET | Freq: Once | ORAL | 0 refills | Status: AC

## 2016-09-28 NOTE — Progress Notes (Signed)
BP 92/60   Pulse 70   Temp 97.3 F (36.3 C) (Oral)   Ht 5\' 4"  (1.626 m)   Wt 169 lb 9.6 oz (76.9 kg)   LMP 12/08/2015   BMI 29.11 kg/m    Subjective:    Patient ID: Kelly [name], female    DOB: 1986-03-18, 30 y.o.   MRN: [id number]  HPI: Kelly [name] is a 30 y.o. female presenting on 09/28/2016 for Abdominal Pain (Csection 2 weeks ago) and Painful urination  2 weeks ago this patient had a C-section. At that time she had a little bit of urinary symptoms but they seem to clear up. However in the last few day she has had increase in suprapubic pain that is above her scar. She denies any fever or chills. She does have increase in urine frequency. There is a color change to her urine. She has had a urinary tract infection history. She is also breast-feeding this time.  Relevant past medical, surgical, family and social history reviewed and updated as indicated. Allergies and medications reviewed and updated.  Past Medical History:  Diagnosis Date  . Gestational diabetes    glyburide    Past Surgical History:  Procedure Laterality Date  . CESAREAN SECTION N/A 09/12/2016   Procedure: CESAREAN SECTION;  Surgeon: Zelphia CairoGretchen Adkins, MD;  [facility];  Service: Obstetrics;  Laterality: N/A;  RNFA  . WISDOM TOOTH EXTRACTION      Review of Systems  Constitutional: Negative.   HENT: Negative.   Eyes: Negative.   Respiratory: Negative.   Gastrointestinal: Negative.   Genitourinary: Positive for difficulty urinating, dysuria and urgency. Negative for flank pain.      Medication List       Accurate as of 09/28/16  9:13 AM. Always use your most recent med list.          amoxicillin 500 MG capsule Commonly known as:  AMOXIL Take 1 capsule (500 mg total) by mouth 3 (three) times daily.   cetirizine 10 MG tablet Commonly known as:  ZYRTEC Take 10 mg by mouth daily.   fluconazole 150 MG tablet Commonly known as:  DIFLUCAN Take 1 tablet  (150 mg total) by mouth once.   ibuprofen 600 MG tablet Commonly known as:  ADVIL,MOTRIN Take 1 tablet (600 mg total) by mouth every 6 (six) hours.   oxyCODONE-acetaminophen 5-325 MG tablet Commonly known as:  PERCOCET/ROXICET Take 1 tablet by mouth every 4 (four) hours as needed for moderate pain.   prenatal multivitamin Tabs tablet Take 1 tablet by mouth daily at 12 noon.          Objective:    BP 92/60   Pulse 70   Temp 97.3 F (36.3 C) (Oral)   Ht 5\' 4"  (1.626 m)   Wt 169 lb 9.6 oz (76.9 kg)   LMP 12/08/2015   BMI 29.11 kg/m   No Known Allergies  Physical Exam  Constitutional: She is oriented to person, place, and time. She appears well-developed and well-nourished.  HENT:  Head: Normocephalic and atraumatic.  Eyes: Conjunctivae are normal. Pupils are equal, round, and reactive to light.  Cardiovascular: Normal rate, regular rhythm, normal heart sounds and intact distal pulses.   Pulmonary/Chest: Effort normal and breath sounds normal.  Abdominal: Soft. Bowel sounds are normal. She exhibits no distension and no mass. There is tenderness in the suprapubic area. There is no rebound, no guarding and no CVA tenderness.  Neurological: She is alert and oriented to  person, place, and time. She has normal reflexes.  Skin: Skin is warm and dry. No rash noted.  Psychiatric: She has a normal mood and affect. Her behavior is normal. Judgment and thought content normal.  Nursing note and vitals reviewed.     Assessment & Plan:   1. UTI symptoms - Urinalysis - amoxicillin (AMOXIL) 500 MG capsule; Take 1 capsule (500 mg total) by mouth 3 (three) times daily.  Dispense: 30 capsule; Refill: 0  2. Acute cystitis with hematuria Patient is breast-feeding and amoxicillin is safe. Her infant is under 691 month old therefore concerned about using Bactrim. He also had some mild jaundice and therefore want to avoid Macrobid at this point. - amoxicillin (AMOXIL) 500 MG capsule; Take 1  capsule (500 mg total) by mouth 3 (three) times daily.  Dispense: 30 capsule; Refill: 0   Continue all other maintenance medications as listed above.  Follow up plan: Return if symptoms worsen or fail to improve.  Orders Placed This Encounter  Procedures  . Urinalysis    Educational handout given for uti  Remus LofflerAngel S. Lashunda Greis PA-C [facility] [address]  [place], [place] [address] [phone]   09/28/2016, 9:13 AM

## 2016-09-28 NOTE — Patient Instructions (Signed)

## 2016-09-30 LAB — URINALYSIS
BILIRUBIN UA: NEGATIVE
GLUCOSE, UA: NEGATIVE
Ketones, UA: NEGATIVE
Nitrite, UA: NEGATIVE
PROTEIN UA: NEGATIVE
SPEC GRAV UA: 1.02 (ref 1.005–1.030)
UUROB: 0.2 mg/dL (ref 0.2–1.0)
pH, UA: 6 (ref 5.0–7.5)

## 2017-06-02 ENCOUNTER — Encounter: Payer: Self-pay | Admitting: Family Medicine

## 2017-06-02 ENCOUNTER — Ambulatory Visit (INDEPENDENT_AMBULATORY_CARE_PROVIDER_SITE_OTHER): Payer: BC Managed Care – PPO | Admitting: Family Medicine

## 2017-06-02 VITALS — BP 113/77 | HR 78 | Temp 97.9°F | Ht 64.0 in | Wt 177.0 lb

## 2017-06-02 DIAGNOSIS — N926 Irregular menstruation, unspecified: Secondary | ICD-10-CM

## 2017-06-02 DIAGNOSIS — J0141 Acute recurrent pansinusitis: Secondary | ICD-10-CM

## 2017-06-02 DIAGNOSIS — R399 Unspecified symptoms and signs involving the genitourinary system: Secondary | ICD-10-CM | POA: Diagnosis not present

## 2017-06-02 DIAGNOSIS — N3090 Cystitis, unspecified without hematuria: Secondary | ICD-10-CM | POA: Diagnosis not present

## 2017-06-02 LAB — URINALYSIS
BILIRUBIN UA: NEGATIVE
GLUCOSE, UA: NEGATIVE
KETONES UA: NEGATIVE
NITRITE UA: NEGATIVE
Protein, UA: NEGATIVE
Specific Gravity, UA: <1.005 — ABNORMAL LOW (ref 1.005–1.030)
UUROB: 0.2 mg/dL (ref 0.2–1.0)
pH, UA: 6 (ref 5.0–7.5)

## 2017-06-02 LAB — PREGNANCY, URINE: PREG TEST UR: NEGATIVE

## 2017-06-02 MED ORDER — AMOXICILLIN-POT CLAVULANATE 875-125 MG PO TABS: 1.0000 | ORAL_TABLET | Freq: Two times a day (BID) | ORAL | 0 refills | Status: DC

## 2017-06-02 MED ORDER — FLUCONAZOLE 150 MG PO TABS: 150.0000 mg | ORAL_TABLET | Freq: Once | ORAL | 0 refills | Status: AC

## 2017-06-02 NOTE — Progress Notes (Signed)
Chief Complaint  Patient presents with  . Sinusitis    pt here today c/o sinus pressure, headache, nasal congestion and ear fullness. Pt also c/o "foul smelling urine".    HPI  Patient presents today for Symptoms include congestion, facial pain, nasal congestion,  post nasal drip and sinus pressure. She usually has pain in her teeth that hasn't started yet. But she has had a headache in the frontal sinus region. There is no fever, chills, or sweats. Onset of symptoms was 2 days ago, gradually worsening since that time.  Patient denies burning with urination. However she has had a foul odor which is typical for her when she gets her bladder infections as well as and frequency for several days. Denies fever . No flank pain. No nausea, vomiting. She is a bit tender suprapubically. No discharge noted. She missed a period last month. Concerned about pregnancy. Stopped breast feeding 3 months ago. (Child now 208 mos old.)   PMH: Smoking status noted ROS: Review of Systems  Constitutional: Negative for chills, diaphoresis and fever.  HENT: Positive for congestion, sinus pain and sore throat.   Eyes: Negative.   Respiratory: Negative for cough and shortness of breath.   Cardiovascular: Negative for chest pain and palpitations.  Gastrointestinal: Negative for abdominal pain, diarrhea, nausea and vomiting.  Genitourinary: Negative for dysuria, flank pain and frequency.  Musculoskeletal: Negative for joint pain.  Skin: Negative for rash.  Neurological: Negative for dizziness and headaches.  Psychiatric/Behavioral: Negative.      Objective: BP 113/77   Pulse 78   Temp 97.9 F (36.6 C) (Oral)   Ht 5\' 4"  (1.626 m)   Wt 177 lb (80.3 kg)   BMI 30.38 kg/m  Gen: NAD, alert, cooperative with exam HEENT: NCAT, EOMI, PERRL CV: RRR, good S1/S2, no murmur Resp: CTABL, no wheezes, non-labored Abd: SNTND, BS present, no guarding or organomegaly Ext: No edema, warm Neuro: Alert and oriented, No gross  deficits  Assessment and plan:  1. UTI symptoms   2. Irregular menses   3. Cystitis   4. Acute recurrent pansinusitis     Meds ordered this encounter  Medications  . LO LOESTRIN FE 1 MG-10 MCG / 10 MCG tablet  . amoxicillin-clavulanate (AUGMENTIN) 875-125 MG tablet    Sig: Take 1 tablet by mouth 2 (two) times daily. Take all of this medication    Dispense:  20 tablet    Refill:  0  . fluconazole (DIFLUCAN) 150 MG tablet    Sig: Take 1 tablet (150 mg total) by mouth once. At onset of symptoms. Repeat at end of treatment    Dispense:  2 tablet    Refill:  0    Orders Placed This Encounter  Procedures  . Urine Culture  . Urinalysis  . Pregnancy, urine    Follow up as needed.  Mechele ClaudeWarren Carolyna Payes, MD

## 2017-06-02 NOTE — Progress Notes (Signed)
0

## 2017-06-04 LAB — URINE CULTURE

## 2017-11-05 ENCOUNTER — Encounter: Payer: Self-pay | Admitting: Family Medicine

## 2017-11-05 ENCOUNTER — Ambulatory Visit: Payer: BC Managed Care – PPO | Admitting: Family Medicine

## 2017-11-05 VITALS — BP 99/61 | HR 81 | Temp 97.7°F | Ht 64.0 in | Wt 181.0 lb

## 2017-11-05 DIAGNOSIS — J0100 Acute maxillary sinusitis, unspecified: Secondary | ICD-10-CM

## 2017-11-05 MED ORDER — AMOXICILLIN 875 MG PO TABS: 875.0000 mg | ORAL_TABLET | Freq: Two times a day (BID) | ORAL | 0 refills | Status: DC

## 2017-11-05 NOTE — Progress Notes (Signed)
Chief Complaint  Patient presents with  . Sinus Problem    pt here today c/o sinus pressure, ears being clogged up and feeling bad    HPI  Patient presents today for Patient presents with upper respiratory congestion. Rhinorrhea that is frequently purulent. There is moderate sore throat. Patient reports coughing frequently as well. Scant sputum noted. There is no fever, chills or sweats. The patient denies being short of breath. Onset was one week ago. Gradually worsening. Tried OTCs without improvement.  PMH: Smoking status noted ROS: Per HPI  Objective: BP 99/61   Pulse 81   Temp 97.7 F (36.5 C) (Oral)   Ht 5\' 4"  (1.626 m)   Wt 181 lb (82.1 kg)   BMI 31.07 kg/m  Gen: NAD, alert, cooperative with exam HEENT: NCAT, Nasal passages swollen, red posterior pharynx has drainage. TMS clr CV: RRR, good S1/S2, no murmur Resp: Bronchitis changes with scattered wheezes, non-labored Ext: No edema, warm Neuro: Alert and oriented, No gross deficits  Assessment and plan:  1. Acute maxillary sinusitis, recurrence not specified     Meds ordered this encounter  Medications  . amoxicillin (AMOXIL) 875 MG tablet    Sig: Take 1 tablet (875 mg total) by mouth 2 (two) times daily.    Dispense:  20 tablet    Refill:  0    OTC remedies discussed as well.  Follow up as needed.  Mechele ClaudeWarren Daune Divirgilio, MD

## 2017-11-09 ENCOUNTER — Encounter: Payer: Self-pay | Admitting: Family Medicine

## 2017-12-22 ENCOUNTER — Encounter (HOSPITAL_COMMUNITY): Payer: Self-pay | Admitting: *Deleted

## 2017-12-22 ENCOUNTER — Other Ambulatory Visit: Payer: Self-pay

## 2017-12-22 NOTE — H&P (Addendum)
Kelly LittenJennifer Bailey [name] is a 32 y.o. female presenting for surgical mngt of missed ab.   OB History    Gravida Para Term Preterm AB Living   2 1 1   1 1    SAB TAB Ectopic Multiple Live Births   1     0 1     Past Medical History:  Diagnosis Date  . Gestational diabetes    glyburide   Past Surgical History:  Procedure Laterality Date  . CESAREAN SECTION N/A 09/12/2016   Procedure: CESAREAN SECTION;  Surgeon: Zelphia CairoGretchen Rendell Thivierge, MD;  [facility];  Service: Obstetrics;  Laterality: N/A;  RNFA  . WISDOM TOOTH EXTRACTION     Family History: family history includes Cancer in her father, maternal aunt, and other; Healthy in her mother. Social History:  reports that  has never smoked. she has never used smokeless tobacco. She reports that she does not drink alcohol or use drugs.     ROS History   Exam Physical Exam  Gen - NAD Abd - soft, NT CV - RRR Lungs - clear Ext - NT  Prenatal labs: ABO, Rh:  A+  PV US:  IUP 12 weeks with no FHT     Assessment/Plan: Missed Ab D&E R/b/a discussed, questions answered, informed consent   Zelphia CairoGretchen Jesten Cappuccio 12/22/2017, 10:14 AM

## 2017-12-23 ENCOUNTER — Encounter (HOSPITAL_COMMUNITY): Payer: Self-pay | Admitting: Anesthesiology

## 2017-12-23 ENCOUNTER — Ambulatory Visit (HOSPITAL_COMMUNITY): Payer: BC Managed Care – PPO

## 2017-12-23 ENCOUNTER — Ambulatory Visit (HOSPITAL_COMMUNITY): Payer: BC Managed Care – PPO | Admitting: Anesthesiology

## 2017-12-23 ENCOUNTER — Encounter (HOSPITAL_COMMUNITY)
Admission: RE | Disposition: A | Discharge status: Home / Self Care | Payer: Self-pay | Source: Ambulatory Visit | Attending: Obstetrics and Gynecology

## 2017-12-23 ENCOUNTER — Ambulatory Visit (HOSPITAL_COMMUNITY)
Admission: RE | Admit: 2017-12-23 | Discharge: 2017-12-23 | Disposition: A | Discharge status: Home / Self Care | Payer: BC Managed Care – PPO | Source: Ambulatory Visit | Attending: Obstetrics and Gynecology | Admitting: Obstetrics and Gynecology

## 2017-12-23 DIAGNOSIS — Z809 Family history of malignant neoplasm, unspecified: Secondary | ICD-10-CM | POA: Diagnosis not present

## 2017-12-23 DIAGNOSIS — O021 Missed abortion: Secondary | ICD-10-CM | POA: Insufficient documentation

## 2017-12-23 DIAGNOSIS — Z419 Encounter for procedure for purposes other than remedying health state, unspecified: Secondary | ICD-10-CM

## 2017-12-23 HISTORY — PX: DILATION AND EVACUATION: SHX1459

## 2017-12-23 HISTORY — DX: Headache, unspecified: R51.9

## 2017-12-23 HISTORY — DX: Headache: R51

## 2017-12-23 HISTORY — DX: Missed abortion: O02.1

## 2017-12-23 LAB — CBC
HCT: 38.7 % (ref 36.0–46.0)
Hemoglobin: 13.5 g/dL (ref 12.0–15.0)
MCH: 31.6 pg (ref 26.0–34.0)
MCHC: 34.9 g/dL (ref 30.0–36.0)
MCV: 90.6 fL (ref 78.0–100.0)
PLATELETS: 199 10*3/uL (ref 150–400)
RBC: 4.27 MIL/uL (ref 3.87–5.11)
RDW: 13.8 % (ref 11.5–15.5)
WBC: 7.6 10*3/uL (ref 4.0–10.5)

## 2017-12-23 LAB — TYPE AND SCREEN
ABO/RH(D): A POS
Antibody Screen: NEGATIVE

## 2017-12-23 SURGERY — DILATION AND EVACUATION, UTERUS
Anesthesia: General

## 2017-12-23 MED ORDER — CEFOTETAN DISODIUM-DEXTROSE 2-2.08 GM-%(50ML) IV SOLR
2.0000 g | INTRAVENOUS | Status: AC
  Administered 2017-12-23: 2 g via INTRAVENOUS

## 2017-12-23 MED ORDER — FENTANYL CITRATE (PF) 100 MCG/2ML IJ SOLN
INTRAMUSCULAR | Status: DC | PRN
  Administered 2017-12-23: 100 ug via INTRAVENOUS
  Administered 2017-12-23: 50 ug via INTRAVENOUS

## 2017-12-23 MED ORDER — PROPOFOL 10 MG/ML IV BOLUS
INTRAVENOUS | Status: AC
  Filled 2017-12-23: qty 20

## 2017-12-23 MED ORDER — ONDANSETRON HCL 4 MG/2ML IJ SOLN
INTRAMUSCULAR | Status: AC
  Filled 2017-12-23: qty 2

## 2017-12-23 MED ORDER — PROPOFOL 10 MG/ML IV BOLUS
INTRAVENOUS | Status: DC | PRN
  Administered 2017-12-23: 20 mg via INTRAVENOUS
  Administered 2017-12-23: 180 mg via INTRAVENOUS

## 2017-12-23 MED ORDER — FENTANYL CITRATE (PF) 250 MCG/5ML IJ SOLN
INTRAMUSCULAR | Status: AC
  Filled 2017-12-23: qty 5

## 2017-12-23 MED ORDER — METHYLERGONOVINE MALEATE 0.2 MG/ML IJ SOLN
INTRAMUSCULAR | Status: AC
  Filled 2017-12-23: qty 1

## 2017-12-23 MED ORDER — METOCLOPRAMIDE HCL 5 MG/ML IJ SOLN
INTRAMUSCULAR | Status: AC
  Administered 2017-12-23: 10 mg via INTRAVENOUS
  Filled 2017-12-23: qty 2

## 2017-12-23 MED ORDER — MIDAZOLAM HCL 2 MG/2ML IJ SOLN
INTRAMUSCULAR | Status: AC
  Filled 2017-12-23: qty 2

## 2017-12-23 MED ORDER — METHYLERGONOVINE MALEATE 0.2 MG/ML IJ SOLN
INTRAMUSCULAR | Status: DC | PRN
  Administered 2017-12-23: 0.2 mg via INTRAMUSCULAR

## 2017-12-23 MED ORDER — CHLOROPROCAINE HCL 1 % IJ SOLN
INTRAMUSCULAR | Status: AC
  Filled 2017-12-23: qty 30

## 2017-12-23 MED ORDER — OXYCODONE-ACETAMINOPHEN 5-325 MG PO TABS: 2.0000 | ORAL_TABLET | ORAL | 0 refills | Status: DC | PRN

## 2017-12-23 MED ORDER — SCOPOLAMINE 1 MG/3DAYS TD PT72
1.0000 | MEDICATED_PATCH | Freq: Once | TRANSDERMAL | Status: DC
  Administered 2017-12-23: 1.5 mg via TRANSDERMAL

## 2017-12-23 MED ORDER — METOCLOPRAMIDE HCL 5 MG/ML IJ SOLN
10.0000 mg | Freq: Once | INTRAMUSCULAR | Status: AC
  Administered 2017-12-23: 10 mg via INTRAVENOUS

## 2017-12-23 MED ORDER — ONDANSETRON HCL 4 MG/2ML IJ SOLN
INTRAMUSCULAR | Status: DC | PRN
  Administered 2017-12-23: 4 mg via INTRAVENOUS

## 2017-12-23 MED ORDER — FENTANYL CITRATE (PF) 100 MCG/2ML IJ SOLN: 25.0000 ug | INTRAMUSCULAR | Status: DC | PRN

## 2017-12-23 MED ORDER — DEXAMETHASONE SODIUM PHOSPHATE 10 MG/ML IJ SOLN
INTRAMUSCULAR | Status: DC | PRN
  Administered 2017-12-23: 10 mg via INTRAVENOUS

## 2017-12-23 MED ORDER — IBUPROFEN 200 MG PO TABS: 200.0000 mg | ORAL_TABLET | Freq: Four times a day (QID) | ORAL | 0 refills | Status: DC | PRN

## 2017-12-23 MED ORDER — ACETAMINOPHEN 160 MG/5ML PO SOLN
975.0000 mg | Freq: Once | ORAL | Status: AC
  Administered 2017-12-23: 975 mg via ORAL

## 2017-12-23 MED ORDER — LIDOCAINE HCL (CARDIAC) 20 MG/ML IV SOLN
INTRAVENOUS | Status: DC | PRN
  Administered 2017-12-23: 80 mg via INTRAVENOUS

## 2017-12-23 MED ORDER — LIDOCAINE HCL (CARDIAC) 20 MG/ML IV SOLN
INTRAVENOUS | Status: AC
  Filled 2017-12-23: qty 5

## 2017-12-23 MED ORDER — DEXTROSE IN LACTATED RINGERS 5 % IV SOLN: INTRAVENOUS | Status: DC

## 2017-12-23 MED ORDER — SODIUM CHLORIDE 0.9% FLUSH
INTRAVENOUS | Status: AC
  Filled 2017-12-23: qty 3

## 2017-12-23 MED ORDER — CHLOROPROCAINE HCL 1 % IJ SOLN
INTRAMUSCULAR | Status: DC | PRN
  Administered 2017-12-23: 10 mL

## 2017-12-23 MED ORDER — MIDAZOLAM HCL 2 MG/2ML IJ SOLN
INTRAMUSCULAR | Status: DC | PRN
  Administered 2017-12-23: 2 mg via INTRAVENOUS

## 2017-12-23 MED ORDER — LACTATED RINGERS IV SOLN
INTRAVENOUS | Status: DC
  Administered 2017-12-23: 13:00:00 via INTRAVENOUS

## 2017-12-23 MED ORDER — METHYLERGONOVINE MALEATE 0.2 MG PO TABS: 0.2000 mg | ORAL_TABLET | Freq: Four times a day (QID) | ORAL | 0 refills | Status: DC

## 2017-12-23 SURGICAL SUPPLY — 19 items
CATH ROBINSON RED A/P 16FR (CATHETERS) ×3 IMPLANT
DECANTER SPIKE VIAL GLASS SM (MISCELLANEOUS) ×3 IMPLANT
GLOVE BIO SURGEON STRL SZ 6.5 (GLOVE) ×2 IMPLANT
GLOVE BIO SURGEONS STRL SZ 6.5 (GLOVE) ×1
GLOVE BIOGEL PI IND STRL 7.0 (GLOVE) ×1 IMPLANT
GLOVE BIOGEL PI INDICATOR 7.0 (GLOVE) ×2
GOWN STRL REUS W/TWL LRG LVL3 (GOWN DISPOSABLE) ×6 IMPLANT
KIT BERKELEY 1ST TRIMESTER 3/8 (MISCELLANEOUS) ×3 IMPLANT
NS IRRIG 1000ML POUR BTL (IV SOLUTION) ×3 IMPLANT
PACK VAGINAL MINOR WOMEN LF (CUSTOM PROCEDURE TRAY) ×3 IMPLANT
PAD OB MATERNITY 4.3X12.25 (PERSONAL CARE ITEMS) ×3 IMPLANT
PAD PREP 24X48 CUFFED NSTRL (MISCELLANEOUS) ×3 IMPLANT
SET BERKELEY SUCTION TUBING (SUCTIONS) ×3 IMPLANT
TOWEL OR 17X24 6PK STRL BLUE (TOWEL DISPOSABLE) ×6 IMPLANT
VACURETTE 10 RIGID CVD (CANNULA) ×3 IMPLANT
VACURETTE 12 RIGID CVD (CANNULA) ×3 IMPLANT
VACURETTE 7MM CVD STRL WRAP (CANNULA) IMPLANT
VACURETTE 8 RIGID CVD (CANNULA) IMPLANT
VACURETTE 9 RIGID CVD (CANNULA) IMPLANT

## 2017-12-23 NOTE — Op Note (Signed)
NAMCharlies Silvers:  Kelly [name], Kelly [name]            ACCOUNT NO.:  [id number]  MEDICAL RECORD NO.:  [id number]  LOCATION:                                 FACILITY:  PHYSICIAN:  Zelphia CairoGretchen Alannis Hsia, MD         DATE OF BIRTH:  DATE OF PROCEDURE:  12/23/2017 DATE OF DISCHARGE:                              OPERATIVE REPORT   PREOPERATIVE DIAGNOSIS:  Missed abortion.  POSTOPERATIVE DIAGNOSIS:  Missed abortion.  PROCEDURES: 1. Paracervical block. 2. Dilation and evacuation with ultrasound guidance.  SURGEON:  Zelphia CairoGretchen Tyner Codner, MD  ESTIMATED BLOOD LOSS:  300 mL.  COMPLICATIONS:  None.  DESCRIPTION OF PROCEDURE:  The patient was taken to the operating room, where anesthesia was obtained.  She was given general anesthesia.  She was placed in the dorsal lithotomy position using Allen stirrups, prepped and draped in sterile fashion, and an in-and-out catheter was used to drain her bladder for minimal urine.  Bivalve speculum was placed in the vagina and 1% lidocaine was used at the anterior lip of the cervix.  Single-tooth tenaculum was attached and a paracervical block was performed with the remaining 9 mL.  The cervix was serially dilated using Pratt dilators and a 10-French suction catheter was inserted.  Products of conception were evacuated from the uterus. Ultrasound was then used to ensure no retained products of conception. Once a clean uterine cavity was identified on ultrasound, a gentle curetting was performed.  Uterine cry was noted throughout.  Tenaculum was removed.  Speculum was removed.  Cervix was hemostatic.  She was extubated and taken to the recovery room in stable condition.     Zelphia CairoGretchen Abel Ra, MD     GA/MEDQ  D:  12/23/2017  T:  12/23/2017  Job:  [id number]

## 2017-12-23 NOTE — Anesthesia Preprocedure Evaluation (Signed)
Anesthesia Evaluation  Patient identified by MRN, date of birth, ID band Patient awake    Reviewed: Allergy & Precautions, H&P , Patient's Chart, lab work & pertinent test results  Airway Mallampati: II  TM Distance: >3 FB Neck ROM: full    Dental no notable dental hx.    Pulmonary    Pulmonary exam normal breath sounds clear to auscultation       Cardiovascular Exercise Tolerance: Good  Rhythm:regular Rate:Normal     Neuro/Psych    GI/Hepatic   Endo/Other    Renal/GU      Musculoskeletal   Abdominal   Peds  Hematology   Anesthesia Other Findings   Reproductive/Obstetrics                             Anesthesia Physical Anesthesia Plan  ASA: II  Anesthesia Plan: General   Post-op Pain Management:    Induction: Intravenous  PONV Risk Score and Plan:   Airway Management Planned: LMA  Additional Equipment:   Intra-op Plan:   Post-operative Plan:   Informed Consent: I have reviewed the patients History and Physical, chart, labs and discussed the procedure including the risks, benefits and alternatives for the proposed anesthesia with the patient or authorized representative who has indicated his/her understanding and acceptance.     Plan Discussed with: CRNA and Surgeon  Anesthesia Plan Comments: ( )        Anesthesia Quick Evaluation  

## 2017-12-23 NOTE — Progress Notes (Signed)
US reviewed with radiologist No FHT, c/w missed ab Discussed with patient Agrees to proceed w/ D&E

## 2017-12-23 NOTE — Anesthesia Procedure Notes (Signed)
Procedure Name: LMA Insertion Date/Time: 12/23/2017 1:17 PM Performed by: Algis GreenhouseBurger, Kween Bacorn A, CRNA Pre-anesthesia Checklist: Patient being monitored, Patient identified, Emergency Drugs available and Suction available Patient Re-evaluated:Patient Re-evaluated prior to induction Oxygen Delivery Method: Circle system utilized Preoxygenation: Pre-oxygenation with 100% oxygen Induction Type: IV induction and Inhalational induction Ventilation: Mask ventilation without difficulty LMA: LMA inserted LMA Size: 4.0 Number of attempts: 1 Dental Injury: Teeth and Oropharynx as per pre-operative assessment

## 2017-12-23 NOTE — Transfer of Care (Signed)
Immediate Anesthesia Transfer of Care Note  Patient: Kelly LittenJennifer Bailey Full  Procedure(s) Performed: DILATATION AND EVACUATION with Intraoperative Ultrasound  (N/A )  Patient Location: PACU  Anesthesia Type:General  Level of Consciousness: sedated  Airway & Oxygen Therapy: Patient Spontanous Breathing  Post-op Assessment: Report given to RN  Post vital signs: Reviewed and stable  Last Vitals:  Vitals:   12/23/17 1204  BP: 116/74  Pulse: 91  Resp: 16  Temp: 37 C  SpO2: 100%    Last Pain:  Vitals:   12/23/17 1204  TempSrc: Oral      Patients Stated Pain Goal: 5 (12/23/17 1204)  Complications: No apparent anesthesia complications

## 2017-12-23 NOTE — Discharge Instructions (Signed)

## 2017-12-24 NOTE — Anesthesia Postprocedure Evaluation (Signed)
Anesthesia Post Note  Patient: Harless LittenJennifer Bailey Heaphy  Procedure(s) Performed: DILATATION AND EVACUATION with Intraoperative Ultrasound  (N/A )     Patient location during evaluation: PACU Anesthesia Type: General Level of consciousness: awake and alert Pain management: pain level controlled Vital Signs Assessment: post-procedure vital signs reviewed and stable Respiratory status: spontaneous breathing, nonlabored ventilation, respiratory function stable and patient connected to nasal cannula oxygen Cardiovascular status: blood pressure returned to baseline and stable Postop Assessment: no apparent nausea or vomiting Anesthetic complications: no    Last Vitals:  Vitals:   12/23/17 1442 12/23/17 1550  BP:  (!) 106/56  Pulse: 83 79  Resp: 16 18  Temp: 37.2 C   SpO2: 98% 99%    Last Pain:  Vitals:   12/23/17 1442  TempSrc: Oral                 Myking Sar EDWARD

## 2017-12-25 ENCOUNTER — Encounter (HOSPITAL_COMMUNITY): Payer: Self-pay | Admitting: Obstetrics and Gynecology

## 2017-12-31 ENCOUNTER — Ambulatory Visit: Payer: BC Managed Care – PPO | Admitting: Family Medicine

## 2017-12-31 ENCOUNTER — Encounter: Payer: Self-pay | Admitting: Family Medicine

## 2017-12-31 VITALS — BP 101/68 | HR 70 | Temp 97.6°F | Ht 64.0 in | Wt 171.0 lb

## 2017-12-31 DIAGNOSIS — J0100 Acute maxillary sinusitis, unspecified: Secondary | ICD-10-CM

## 2017-12-31 MED ORDER — PSEUDOEPHEDRINE-GUAIFENESIN ER 120-1200 MG PO TB12: 1.0000 | ORAL_TABLET | Freq: Two times a day (BID) | ORAL | 0 refills | Status: DC

## 2017-12-31 MED ORDER — AMOXICILLIN-POT CLAVULANATE 875-125 MG PO TABS: 1.0000 | ORAL_TABLET | Freq: Two times a day (BID) | ORAL | 0 refills | Status: DC

## 2017-12-31 NOTE — Progress Notes (Signed)
Chief Complaint  Patient presents with  . Sinusitis    teeth pain, right sided facial pain, x3 days no known fever    HPI  Patient presents today for Patient presents with upper respiratory congestion.  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  Now she reports right cheek and upper teeth pain.  No rhinorrhea t. There is moderate posterior drainage t. Patient reports there has been no cough there is no fever, chills or sweats. The patient denies being short of breath. Onset was 3-5 days ago. Gradually worsening. Tried OTCs without improvement.  PMH: Smoking status noted ROS: Per HPI  Objective: BP 101/68   Pulse 70   Temp 97.6 F (36.4 C) (Oral)   Ht 5\' 4"  (1.626 m)   Wt 171 lb (77.6 kg)   BMI 29.35 kg/m  Gen: NAD, alert, cooperative with exam HEENT: NCAT, Nasal passages swollen, red.  Pharynx has posterior drainage.  Right maxillary sinuses tender CV: RRR, good S1/S2, no murmur Resp: Clear to auscultation Ext: No edema, warm Neuro: Alert and oriented, No gross deficits  Assessment and plan:  No diagnosis found.  No orders of the defined types were placed in this encounter.   No orders of the defined types were placed in this encounter.   Follow up as needed.  Mechele ClaudeWarren Raeonna Milo, MD

## 2018-02-23 ENCOUNTER — Encounter: Payer: Self-pay | Admitting: Family Medicine

## 2018-02-23 ENCOUNTER — Ambulatory Visit: Payer: BC Managed Care – PPO | Admitting: Family Medicine

## 2018-02-23 VITALS — BP 108/63 | HR 127 | Temp 101.2°F | Ht 64.0 in | Wt 178.0 lb

## 2018-02-23 DIAGNOSIS — R6889 Other general symptoms and signs: Secondary | ICD-10-CM

## 2018-02-23 LAB — VERITOR FLU A/B WAIVED
Influenza A: NEGATIVE
Influenza B: NEGATIVE

## 2018-02-23 NOTE — Patient Instructions (Signed)
Your influenza test was negative.  It appears that you have a viral upper respiratory infection (cold).  Cold symptoms can last up to 2 weeks.    - Get plenty of rest and drink plenty of fluids. - Try to breathe moist air. Use a cold mist humidifier. - Consume warm fluids (soup or tea) to provide relief for a stuffy nose and to loosen phlegm. - For nasal stuffiness, try saline nasal spray or a Neti Pot. Afrin nasal spray can also be used but this product should not be used longer than 3 days or it will cause rebound nasal stuffiness (worsening nasal congestion). - For sore throat pain relief: suck on throat lozenges, hard candy or popsicles; gargle with warm salt water (1/4 tsp. salt per 8 oz. of water); and eat soft, bland foods. - Eat a well-balanced diet. If you cannot, ensure you are getting enough nutrients by taking a daily multivitamin. - Avoid dairy products, as they can thicken phlegm. - Avoid alcohol, as it impairs your body's immune system.  CONTACT YOUR DOCTOR IF YOU EXPERIENCE ANY OF THE FOLLOWING: - High fever - Ear pain - Sinus-type headache - Unusually severe cold symptoms - Cough that gets worse while other cold symptoms improve - Flare up of any chronic lung problem, such as asthma - Your symptoms persist longer than 2 weeks

## 2018-02-23 NOTE — Progress Notes (Signed)
Subjective: CC: Flu PCP: Ernestina PennaMoore, Donald W, MD [id number]:Kelly [name] is a 32 y.o. female presenting to clinic today for:  1. Flu- like symptoms Patient reports abrupt onset of body aches, chills, decreased appetite, nausea and diarrhea this morning.  She has not measured any fevers but does note that she has been warm.  She is used TheraFlu and over-the-counter cough and cold medication with little improvement.  Denies cough, congestion, rhinorrhea, vomiting.  There is no blood in her diarrhea.  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  She has had her flu shot this year.  No history of asthma or COPD. LMP 1 week ago.   ROS: Per HPI  No Known Allergies Past Medical History:  Diagnosis Date  . Headache   . Missed abortion 05/2015   no surgery required    Current Outpatient Medications:  .  ibuprofen (ADVIL) 200 MG tablet, Take 1 tablet (200 mg total) by mouth every 6 (six) hours as needed., Disp: 30 tablet, Rfl: 0 Social History   Socioeconomic History  . Marital status: Married    Spouse name: Not on file  . Number of children: Not on file  . Years of education: Not on file  . Highest education level: Not on file  Occupational History  . Not on file  Social Needs  . Financial resource strain: Not on file  . Food insecurity:    Worry: Not on file    Inability: Not on file  . Transportation needs:    Medical: Not on file    Non-medical: Not on file  Tobacco Use  . Smoking status: Never Smoker  . Smokeless tobacco: Never Used  Substance and Sexual Activity  . Alcohol use: No  . Drug use: No  . Sexual activity: Not on file    Comment: approx 12-[redacted] wks gestation per patient 12/22/17  Lifestyle  . Physical activity:    Days per week: Not on file    Minutes per session: Not on file  . Stress: Not on file  Relationships  . Social connections:    Talks on phone: Not on file    Gets together: Not on file    Attends religious service: Not on file    Active member of club or organization: Not on file    Attends meetings of clubs or organizations: Not on file    Relationship status: Not on file  . Intimate partner violence:    Fear of current or ex partner: Not on file    Emotionally abused: Not on file    Physically abused: Not on file    Forced sexual activity: Not on file  Other Topics Concern  . Not on file  Social History Narrative  . Not on file   Family History  Problem Relation Age of Onset  . Healthy Mother   . Cancer Father        skin  . Cancer Other   . Cancer Maternal Aunt     Objective: Office vital signs reviewed. BP 108/63   Pulse (!) 127   Temp (!) 101.2 F (38.4 C) (Oral)   Ht 5\' 4"  (1.626 m)   Wt 178 lb (80.7 kg)   BMI 30.55 kg/m   Physical Examination:  General: Awake, alert, tired appearing/ clammy, No acute distress HEENT: Normal    Neck: No masses palpated. No lymphadenopathy    Ears: Tympanic membranes intact, normal light reflex, no erythema, no bulging  Eyes: PERRLA, extraocular membranes intact, sclera white    Nose: nasal turbinates moist, clear nasal discharge    Throat: moist mucus membranes, no erythema, no tonsillar exudate.  Airway is patent Cardio: Tachycardic, w/ rhythm, S1S2 heard, no murmurs appreciated Pulm: clear to auscultation bilaterally, no wheezes, rhonchi or rales; normal work of breathing on room air  Assessment/ Plan: 32 y.o. female   1. Flu-like symptoms Patient is febrile to 101.2 F here in office.  She has tachycardia as well, which I suspect is related to the febrile state.  She is nontoxic but tired appearing.  Her rapid flu was negative.  We discussed that this may be a false negative and we could still consider empiric treatment with Tamiflu.  Risks and benefits of medication use discussed.  I also offered her Zofran for nausea.  Patient wished to not utilize the Zofran or Tamiflu and continue home care and rest.  Home care instructions were reviewed and a  handout was provided.  I have given her a work note through Friday.  She may return if she is afebrile unmedicated for 24 hours. Strict return precautions and reasons for emergent evaluation in the emergency department review with patient.  They voiced understanding and will follow-up as needed. - Veritor Flu A/B Waived    Orders Placed This Encounter  Procedures  . Veritor Flu A/B Waived    Order Specific Question:   Source    Answer:   nasal    Gabryel Talamo Hulen Skains, DO [facility] [phone]

## 2018-06-09 LAB — OB RESULTS CONSOLE ABO/RH: RH Type: POSITIVE

## 2018-06-09 LAB — OB RESULTS CONSOLE GC/CHLAMYDIA
Chlamydia: NEGATIVE
GC PROBE AMP, GENITAL: NEGATIVE

## 2018-06-09 LAB — OB RESULTS CONSOLE HIV ANTIBODY (ROUTINE TESTING): HIV: NONREACTIVE

## 2018-06-09 LAB — OB RESULTS CONSOLE RUBELLA ANTIBODY, IGM: Rubella: IMMUNE

## 2018-06-09 LAB — OB RESULTS CONSOLE HEPATITIS B SURFACE ANTIGEN: Hepatitis B Surface Ag: NEGATIVE

## 2018-06-09 LAB — OB RESULTS CONSOLE RPR: RPR: NONREACTIVE

## 2018-06-09 LAB — OB RESULTS CONSOLE ANTIBODY SCREEN: Antibody Screen: NEGATIVE

## 2018-12-29 NOTE — H&P (Addendum)
Kelly [name] is a 33 y.o. female presenting for repeat c-section . OB History    Gravida  3   Para  1   Term  1   Preterm      AB  1   Living  1     SAB  1   TAB      Ectopic      Multiple  0   Live Births  1          Past Medical History:  Diagnosis Date  . Headache   . Missed abortion 05/2015   no surgery required   Past Surgical History:  Procedure Laterality Date  . CESAREAN SECTION N/A 09/12/2016   Procedure: CESAREAN SECTION;  Surgeon: Zelphia Cairo, MD;  Location: [facility];  Service: Obstetrics;  Laterality: N/A;  RNFA  . DILATION AND EVACUATION N/A 12/23/2017   Procedure: DILATATION AND EVACUATION with Intraoperative Ultrasound ;  Surgeon: Zelphia Cairo, MD;  Location: [facility] ORS;  Service: Gynecology;  Laterality: N/A;  . WISDOM TOOTH EXTRACTION     Family History: family history includes Cancer in her father, maternal aunt, and another family member; Healthy in her mother. Social History:  reports that she has never smoked. She has never used smokeless tobacco. She reports that she does not drink alcohol or use drugs.      Genetic Screening: Normal Maternal Ultrasounds/Referrals: Normal Fetal Ultrasounds or other Referrals:  None Maternal Substance Abuse:  No Significant Maternal Medications:  None Significant Maternal Lab Results:  None Other Comments:  None  ROS History   Exam Physical Exam  Gen - NAD CV - RRR Lungs - clear Abd - gravid, NT Ext - NT   Prenatal labs: ABO, Rh:   Antibody:   Rubella:   RPR:    HBsAg:    HIV:    GBS:     Assessment/Plan: Previous c-section  Repeat c-section R/b/a discussed, questions answered, informed consent   Zelphia Cairo 12/29/2018, 9:24 PM

## 2018-12-31 ENCOUNTER — Encounter (HOSPITAL_COMMUNITY): Payer: Self-pay | Admitting: *Deleted

## 2019-01-08 ENCOUNTER — Encounter (HOSPITAL_COMMUNITY)
Admission: RE | Admit: 2019-01-08 | Discharge: 2019-01-08 | Disposition: A | Discharge status: Home / Self Care | Payer: BC Managed Care – PPO | Source: Ambulatory Visit | Attending: Obstetrics and Gynecology | Admitting: Obstetrics and Gynecology

## 2019-01-08 HISTORY — DX: Other specified postprocedural states: Z98.890

## 2019-01-08 HISTORY — DX: Nausea with vomiting, unspecified: R11.2

## 2019-01-08 LAB — CBC
HEMATOCRIT: 41.7 % (ref 36.0–46.0)
HEMOGLOBIN: 13.9 g/dL (ref 12.0–15.0)
MCH: 31.4 pg (ref 26.0–34.0)
MCHC: 33.3 g/dL (ref 30.0–36.0)
MCV: 94.3 fL (ref 80.0–100.0)
Platelets: 178 10*3/uL (ref 150–400)
RBC: 4.42 MIL/uL (ref 3.87–5.11)
RDW: 15.9 % — ABNORMAL HIGH (ref 11.5–15.5)
WBC: 7.4 10*3/uL (ref 4.0–10.5)
nRBC: 0 % (ref 0.0–0.2)

## 2019-01-08 LAB — TYPE AND SCREEN
ABO/RH(D): A POS
ANTIBODY SCREEN: NEGATIVE

## 2019-01-08 NOTE — Patient Instructions (Signed)
Kelly [name]  01/08/2019   Your procedure is scheduled on:  01/11/2019  Enter through the Main Entrance of [facility] at 0530 AM.  Pick up the phone at the desk and dial [phone]  Call this number if you have problems the morning of surgery:[phone]  Remember:   Do not eat food:(After Midnight) Desps de medianoche.  Do not drink clear liquids: (After Midnight) Desps de medianoche.  Take these medicines the morning of surgery with A SIP OF WATER: none   Do not wear jewelry, make-up or nail polish.  Do not wear lotions, powders, or perfumes. Do not wear deodorant.  Do not shave 48 hours prior to surgery.  Do not bring valuables to the hospital.  [facility] is not   responsible for any belongings or valuables brought to the hospital.  Contacts, dentures or bridgework may not be worn into surgery.  Leave suitcase in the car. After surgery it may be brought to your room.  For patients admitted to the hospital, checkout time is 11:00 AM the day of              discharge.    N/A   Please read over the following fact sheets that you were given:   Surgical Site Infection Prevention

## 2019-01-09 LAB — RPR: RPR: NONREACTIVE

## 2019-01-11 ENCOUNTER — Other Ambulatory Visit: Payer: Self-pay

## 2019-01-11 ENCOUNTER — Inpatient Hospital Stay (HOSPITAL_COMMUNITY): Payer: BC Managed Care – PPO | Admitting: Anesthesiology

## 2019-01-11 ENCOUNTER — Inpatient Hospital Stay (HOSPITAL_COMMUNITY)
Admission: RE | Admit: 2019-01-11 | Discharge: 2019-01-14 | DRG: 788 | Disposition: A | Discharge status: Home / Self Care | Payer: BC Managed Care – PPO | Attending: Obstetrics and Gynecology | Admitting: Obstetrics and Gynecology

## 2019-01-11 ENCOUNTER — Encounter (HOSPITAL_COMMUNITY): Payer: Self-pay | Admitting: Obstetrics and Gynecology

## 2019-01-11 ENCOUNTER — Encounter (HOSPITAL_COMMUNITY)
Admission: RE | Disposition: A | Discharge status: Home / Self Care | Payer: Self-pay | Source: Home / Self Care | Attending: Obstetrics and Gynecology

## 2019-01-11 DIAGNOSIS — Z98891 History of uterine scar from previous surgery: Secondary | ICD-10-CM

## 2019-01-11 DIAGNOSIS — O34211 Maternal care for low transverse scar from previous cesarean delivery: Principal | ICD-10-CM | POA: Diagnosis present

## 2019-01-11 DIAGNOSIS — Z3A39 39 weeks gestation of pregnancy: Secondary | ICD-10-CM

## 2019-01-11 HISTORY — DX: History of uterine scar from previous surgery: Z98.891

## 2019-01-11 SURGERY — Surgical Case
Anesthesia: Spinal | Site: Abdomen | Wound class: Clean Contaminated

## 2019-01-11 MED ORDER — PHENYLEPHRINE 8 MG IN D5W 100 ML (0.08MG/ML) PREMIX OPTIME
INJECTION | INTRAVENOUS | Status: AC
  Filled 2019-01-11: qty 100

## 2019-01-11 MED ORDER — LACTATED RINGERS IV SOLN
INTRAVENOUS | Status: DC | PRN
  Administered 2019-01-11 (×3): via INTRAVENOUS

## 2019-01-11 MED ORDER — PRENATAL MULTIVITAMIN CH
1.0000 | ORAL_TABLET | Freq: Every day | ORAL | Status: DC
  Administered 2019-01-12 – 2019-01-13 (×2): 1 via ORAL
  Filled 2019-01-11 (×2): qty 1

## 2019-01-11 MED ORDER — IBUPROFEN 600 MG PO TABS
600.0000 mg | ORAL_TABLET | Freq: Four times a day (QID) | ORAL | Status: DC | PRN
  Administered 2019-01-11 – 2019-01-14 (×12): 600 mg via ORAL
  Filled 2019-01-11 (×12): qty 1

## 2019-01-11 MED ORDER — SCOPOLAMINE 1 MG/3DAYS TD PT72
MEDICATED_PATCH | TRANSDERMAL | Status: AC
  Filled 2019-01-11: qty 1

## 2019-01-11 MED ORDER — ONDANSETRON HCL 4 MG/2ML IJ SOLN
INTRAMUSCULAR | Status: AC
  Filled 2019-01-11: qty 2

## 2019-01-11 MED ORDER — SODIUM CHLORIDE 0.9% FLUSH
INTRAVENOUS | Status: AC
  Filled 2019-01-11: qty 12

## 2019-01-11 MED ORDER — MEPERIDINE HCL 25 MG/ML IJ SOLN
INTRAMUSCULAR | Status: DC | PRN
  Administered 2019-01-11: 12.5 mg via INTRAVENOUS

## 2019-01-11 MED ORDER — PHENYLEPHRINE 8 MG IN D5W 100 ML (0.08MG/ML) PREMIX OPTIME
INJECTION | INTRAVENOUS | Status: DC | PRN
  Administered 2019-01-11: 60 ug/min via INTRAVENOUS

## 2019-01-11 MED ORDER — LACTATED RINGERS IV SOLN
INTRAVENOUS | Status: DC
  Administered 2019-01-11: 06:00:00 via INTRAVENOUS

## 2019-01-11 MED ORDER — DIPHENHYDRAMINE HCL 25 MG PO CAPS
25.0000 mg | ORAL_CAPSULE | Freq: Four times a day (QID) | ORAL | Status: DC | PRN
  Administered 2019-01-11: 25 mg via ORAL
  Filled 2019-01-11: qty 1

## 2019-01-11 MED ORDER — KETOROLAC TROMETHAMINE 30 MG/ML IJ SOLN: 30.0000 mg | Freq: Once | INTRAMUSCULAR | Status: DC | PRN

## 2019-01-11 MED ORDER — COCONUT OIL OIL: 1.0000 "application " | TOPICAL_OIL | Status: DC | PRN

## 2019-01-11 MED ORDER — DEXTROSE IN LACTATED RINGERS 5 % IV SOLN
INTRAVENOUS | Status: DC
  Administered 2019-01-11 (×2): via INTRAVENOUS

## 2019-01-11 MED ORDER — MORPHINE SULFATE (PF) 4 MG/ML IV SOLN: 1.0000 mg | INTRAVENOUS | Status: DC | PRN

## 2019-01-11 MED ORDER — DEXAMETHASONE SODIUM PHOSPHATE 4 MG/ML IJ SOLN
INTRAMUSCULAR | Status: DC | PRN
  Administered 2019-01-11: 4 mg via INTRAVENOUS

## 2019-01-11 MED ORDER — SIMETHICONE 80 MG PO CHEW
80.0000 mg | CHEWABLE_TABLET | ORAL | Status: DC
  Administered 2019-01-12 – 2019-01-13 (×3): 80 mg via ORAL
  Filled 2019-01-11 (×3): qty 1

## 2019-01-11 MED ORDER — MORPHINE SULFATE (PF) 0.5 MG/ML IJ SOLN
INTRAMUSCULAR | Status: AC
  Filled 2019-01-11: qty 10

## 2019-01-11 MED ORDER — SIMETHICONE 80 MG PO CHEW
80.0000 mg | CHEWABLE_TABLET | Freq: Three times a day (TID) | ORAL | Status: DC
  Administered 2019-01-11 – 2019-01-14 (×9): 80 mg via ORAL
  Filled 2019-01-11 (×8): qty 1

## 2019-01-11 MED ORDER — MEDROXYPROGESTERONE ACETATE 150 MG/ML IM SUSP: 150.0000 mg | INTRAMUSCULAR | Status: DC | PRN

## 2019-01-11 MED ORDER — PHENYLEPHRINE HCL 10 MG/ML IJ SOLN
INTRAMUSCULAR | Status: DC | PRN
  Administered 2019-01-11: 40 ug via INTRAVENOUS
  Administered 2019-01-11: 80 ug via INTRAVENOUS
  Administered 2019-01-11: 40 ug via INTRAVENOUS
  Administered 2019-01-11: 80 ug via INTRAVENOUS

## 2019-01-11 MED ORDER — MEPERIDINE HCL 25 MG/ML IJ SOLN
INTRAMUSCULAR | Status: AC
  Filled 2019-01-11: qty 1

## 2019-01-11 MED ORDER — MEPERIDINE HCL 25 MG/ML IJ SOLN
6.2500 mg | INTRAMUSCULAR | Status: DC | PRN
  Administered 2019-01-11: 6.25 mg via INTRAVENOUS

## 2019-01-11 MED ORDER — FENTANYL CITRATE (PF) 100 MCG/2ML IJ SOLN
INTRAMUSCULAR | Status: DC | PRN
  Administered 2019-01-11: 15 ug via INTRATHECAL

## 2019-01-11 MED ORDER — SODIUM CHLORIDE 0.9 % IR SOLN
Status: DC | PRN
  Administered 2019-01-11: 1

## 2019-01-11 MED ORDER — DIBUCAINE 1 % RE OINT: 1.0000 "application " | TOPICAL_OINTMENT | RECTAL | Status: DC | PRN

## 2019-01-11 MED ORDER — STERILE WATER FOR IRRIGATION IR SOLN
Status: DC | PRN
  Administered 2019-01-11: 1

## 2019-01-11 MED ORDER — MIDAZOLAM HCL 2 MG/2ML IJ SOLN: 0.5000 mg | Freq: Once | INTRAMUSCULAR | Status: DC | PRN

## 2019-01-11 MED ORDER — OXYTOCIN 10 UNIT/ML IJ SOLN
INTRAMUSCULAR | Status: AC
  Filled 2019-01-11: qty 4

## 2019-01-11 MED ORDER — WITCH HAZEL-GLYCERIN EX PADS: 1.0000 "application " | MEDICATED_PAD | CUTANEOUS | Status: DC | PRN

## 2019-01-11 MED ORDER — SENNOSIDES-DOCUSATE SODIUM 8.6-50 MG PO TABS
2.0000 | ORAL_TABLET | ORAL | Status: DC
  Administered 2019-01-12 – 2019-01-13 (×3): 2 via ORAL
  Filled 2019-01-11 (×3): qty 2

## 2019-01-11 MED ORDER — DEXAMETHASONE SODIUM PHOSPHATE 4 MG/ML IJ SOLN
INTRAMUSCULAR | Status: AC
  Filled 2019-01-11: qty 1

## 2019-01-11 MED ORDER — NALBUPHINE HCL 10 MG/ML IJ SOLN: 5.0000 mg | INTRAMUSCULAR | Status: DC | PRN

## 2019-01-11 MED ORDER — PROMETHAZINE HCL 25 MG/ML IJ SOLN
INTRAMUSCULAR | Status: AC
  Filled 2019-01-11: qty 1

## 2019-01-11 MED ORDER — OXYCODONE-ACETAMINOPHEN 5-325 MG PO TABS: 1.0000 | ORAL_TABLET | ORAL | Status: DC | PRN

## 2019-01-11 MED ORDER — OXYTOCIN 10 UNIT/ML IJ SOLN
INTRAVENOUS | Status: DC | PRN
  Administered 2019-01-11: 40 [IU] via INTRAVENOUS

## 2019-01-11 MED ORDER — ONDANSETRON HCL 4 MG/2ML IJ SOLN
INTRAMUSCULAR | Status: DC | PRN
  Administered 2019-01-11: 4 mg via INTRAVENOUS

## 2019-01-11 MED ORDER — PHENYLEPHRINE 40 MCG/ML (10ML) SYRINGE FOR IV PUSH (FOR BLOOD PRESSURE SUPPORT)
PREFILLED_SYRINGE | INTRAVENOUS | Status: AC
  Filled 2019-01-11: qty 10

## 2019-01-11 MED ORDER — FENTANYL CITRATE (PF) 100 MCG/2ML IJ SOLN
INTRAMUSCULAR | Status: AC
  Filled 2019-01-11: qty 2

## 2019-01-11 MED ORDER — PROMETHAZINE HCL 25 MG/ML IJ SOLN
6.2500 mg | INTRAMUSCULAR | Status: DC | PRN
  Administered 2019-01-11: 12.5 mg via INTRAVENOUS

## 2019-01-11 MED ORDER — OXYTOCIN 40 UNITS IN NORMAL SALINE INFUSION - SIMPLE MED: 2.5000 [IU]/h | INTRAVENOUS | Status: DC

## 2019-01-11 MED ORDER — TETANUS-DIPHTH-ACELL PERTUSSIS 5-2.5-18.5 LF-MCG/0.5 IM SUSP: 0.5000 mL | Freq: Once | INTRAMUSCULAR | Status: DC

## 2019-01-11 MED ORDER — MEASLES, MUMPS & RUBELLA VAC IJ SOLR
0.5000 mL | Freq: Once | INTRAMUSCULAR | Status: DC
  Filled 2019-01-11: qty 0.5

## 2019-01-11 MED ORDER — BUPIVACAINE IN DEXTROSE 0.75-8.25 % IT SOLN
INTRATHECAL | Status: DC | PRN
  Administered 2019-01-11: 1.6 mL via INTRATHECAL

## 2019-01-11 MED ORDER — SIMETHICONE 80 MG PO CHEW: 80.0000 mg | CHEWABLE_TABLET | ORAL | Status: DC | PRN

## 2019-01-11 MED ORDER — CEFAZOLIN SODIUM-DEXTROSE 2-4 GM/100ML-% IV SOLN
2.0000 g | INTRAVENOUS | Status: AC
  Administered 2019-01-11: 2 g via INTRAVENOUS
  Filled 2019-01-11: qty 100

## 2019-01-11 MED ORDER — MORPHINE SULFATE (PF) 0.5 MG/ML IJ SOLN
INTRAMUSCULAR | Status: DC | PRN
  Administered 2019-01-11: .15 mg via INTRATHECAL

## 2019-01-11 MED ORDER — SCOPOLAMINE 1 MG/3DAYS TD PT72
1.0000 | MEDICATED_PATCH | Freq: Once | TRANSDERMAL | Status: DC
  Administered 2019-01-11: 1.5 mg via TRANSDERMAL

## 2019-01-11 MED ORDER — SOD CITRATE-CITRIC ACID 500-334 MG/5ML PO SOLN
30.0000 mL | Freq: Once | ORAL | Status: AC
  Administered 2019-01-11: 30 mL via ORAL

## 2019-01-11 MED ORDER — MENTHOL 3 MG MT LOZG: 1.0000 | LOZENGE | OROMUCOSAL | Status: DC | PRN

## 2019-01-11 MED ORDER — SOD CITRATE-CITRIC ACID 500-334 MG/5ML PO SOLN
ORAL | Status: AC
  Filled 2019-01-11: qty 15

## 2019-01-11 SURGICAL SUPPLY — 34 items
BENZOIN TINCTURE PRP APPL 2/3 (GAUZE/BANDAGES/DRESSINGS) ×3 IMPLANT
CHLORAPREP W/TINT 26ML (MISCELLANEOUS) ×3 IMPLANT
CLAMP CORD UMBIL (MISCELLANEOUS) IMPLANT
CLOSURE STERI STRIP 1/2 X4 (GAUZE/BANDAGES/DRESSINGS) ×3 IMPLANT
CLOTH BEACON ORANGE TIMEOUT ST (SAFETY) ×3 IMPLANT
DERMABOND ADVANCED (GAUZE/BANDAGES/DRESSINGS) ×2
DERMABOND ADVANCED .7 DNX12 (GAUZE/BANDAGES/DRESSINGS) ×1 IMPLANT
DRSG OPSITE POSTOP 4X10 (GAUZE/BANDAGES/DRESSINGS) ×3 IMPLANT
ELECT REM PT RETURN 9FT ADLT (ELECTROSURGICAL) ×3
ELECTRODE REM PT RTRN 9FT ADLT (ELECTROSURGICAL) ×1 IMPLANT
EXTRACTOR VACUUM M CUP 4 TUBE (SUCTIONS) IMPLANT
EXTRACTOR VACUUM M CUP 4' TUBE (SUCTIONS)
GLOVE BIO SURGEON STRL SZ 6.5 (GLOVE) ×2 IMPLANT
GLOVE BIO SURGEONS STRL SZ 6.5 (GLOVE) ×1
GLOVE BIOGEL PI IND STRL 7.0 (GLOVE) ×2 IMPLANT
GLOVE BIOGEL PI INDICATOR 7.0 (GLOVE) ×4
GOWN STRL REUS W/TWL LRG LVL3 (GOWN DISPOSABLE) ×6 IMPLANT
HOVERMATT SINGLE USE (MISCELLANEOUS) ×3 IMPLANT
KIT ABG SYR 3ML LUER SLIP (SYRINGE) IMPLANT
NEEDLE HYPO 25X5/8 SAFETYGLIDE (NEEDLE) IMPLANT
NS IRRIG 1000ML POUR BTL (IV SOLUTION) ×3 IMPLANT
PACK C SECTION WH (CUSTOM PROCEDURE TRAY) ×3 IMPLANT
PAD OB MATERNITY 4.3X12.25 (PERSONAL CARE ITEMS) ×3 IMPLANT
PENCIL SMOKE EVAC W/HOLSTER (ELECTROSURGICAL) ×3 IMPLANT
SUT CHROMIC 0 CT 802H (SUTURE) IMPLANT
SUT CHROMIC 0 CTX 36 (SUTURE) ×9 IMPLANT
SUT MON AB-0 CT1 36 (SUTURE) ×3 IMPLANT
SUT PDS AB 0 CTX 60 (SUTURE) ×3 IMPLANT
SUT PLAIN 0 NONE (SUTURE) IMPLANT
SUT VIC AB 4-0 KS 27 (SUTURE) IMPLANT
SYR BULB 3OZ (MISCELLANEOUS) ×3 IMPLANT
TOWEL OR 17X24 6PK STRL BLUE (TOWEL DISPOSABLE) ×3 IMPLANT
TRAY FOLEY W/BAG SLVR 14FR LF (SET/KITS/TRAYS/PACK) IMPLANT
WATER STERILE IRR 1000ML POUR (IV SOLUTION) ×3 IMPLANT

## 2019-01-11 NOTE — Lactation Note (Addendum)
This note was copied from a baby's chart. Lactation Consultation Note  Patient Name: Kelly [name] Date: 01/11/2019 Reason for consult: Initial assessment P2, 13 hour female infant, c/s delivery  Per mom, she BF her son for 6 months. Per mom, she has DEBP at home. Per parents infant had 4 stools. Per parents, infant been a little spity and had some emesis, they have been making attempts to latch and doing STS. LC notice infant has Scarlette Ar) top gumline. Mom latched infant on left breast using football hold, infant latched well, swallows observed by LC ,mom used waking techniques but infant feel asleep after 6 minutes. Per mom, she felt a  tug but no pain or  discomfort with infant having the Scarlette Ar). When infant unlatched  from mom's nipple, her nipple was smooth and well rounded. Mom hand expressed 2 ml of colostrum that infant took by spoon. LC notice mom semi flat but breast responses well to stimulation and infant latches well breast shells not needed at this time.  Mom will continue to BF according hunger cues and attempt to BF infant 8 to 12 times within 24 hours. LC discussed I & O. Reviewed Baby & Me book's Breastfeeding Basics.  Mom will call Nurse or ask for [facility] services if she has any BF questions, concerns or need assistance with latching infant to breast. Mom made aware of O/P services, breastfeeding support groups, community resources, and our phone # for post-discharge questions.  Maternal Data Formula Feeding for Exclusion: No Has patient been taught Hand Expression?: Yes(Mom hand expressed 101ml of colostrum, given infant on spoon.) Does the patient have breastfeeding experience prior to this delivery?: Yes(Per mom, BF eldest son for 6 months.)  Feeding Feeding Type: Breast Fed  LATCH Score Latch: Grasps breast easily, tongue down, lips flanged, rhythmical sucking.  Audible Swallowing: A few with stimulation  Type of Nipple: Everted at rest  and after stimulation  Comfort (Breast/Nipple): Soft / non-tender  Hold (Positioning): Assistance needed to correctly position infant at breast and maintain latch.  LATCH Score: 8  Interventions Interventions: Breast feeding basics reviewed;Assisted with latch;Skin to skin;Breast massage;Hand express;Breast compression;Adjust position;Support pillows;Position options;Expressed milk  Lactation Tools Discussed/Used WIC Program: No   Consult Status Consult Status: Follow-up Date: 01/12/19 Follow-up type: In-patient    Danelle Earthly 01/11/2019, 9:50 PM

## 2019-01-11 NOTE — Anesthesia Preprocedure Evaluation (Signed)
Anesthesia Evaluation  Patient identified by MRN, date of birth, ID band Patient awake    Reviewed: Allergy & Precautions, NPO status , Patient's Chart, lab work & pertinent test results  History of Anesthesia Complications (+) PONV  Airway Mallampati: II  TM Distance: >3 FB Neck ROM: Full    Dental  (+) Dental Advisory Given, Teeth Intact   Pulmonary neg pulmonary ROS,    breath sounds clear to auscultation       Cardiovascular (-) hypertensionnegative cardio ROS   Rhythm:Regular Rate:Normal     Neuro/Psych  Headaches,    GI/Hepatic negative GI ROS, Neg liver ROS,   Endo/Other  negative endocrine ROS  Renal/GU negative Renal ROS     Musculoskeletal   Abdominal   Peds  Hematology negative hematology ROS (+) plt 178k   Anesthesia Other Findings   Reproductive/Obstetrics (+) Pregnancy                             Anesthesia Physical Anesthesia Plan  ASA: II  Anesthesia Plan: Spinal   Post-op Pain Management:    Induction:   PONV Risk Score and Plan: 3 and Ondansetron, Dexamethasone and Scopolamine patch - Pre-op  Airway Management Planned: Natural Airway  Additional Equipment:   Intra-op Plan:   Post-operative Plan:   Informed Consent: I have reviewed the patients History and Physical, chart, labs and discussed the procedure including the risks, benefits and alternatives for the proposed anesthesia with the patient or authorized representative who has indicated his/her understanding and acceptance.     Dental advisory given  Plan Discussed with: CRNA and Surgeon  Anesthesia Plan Comments:         Anesthesia Quick Evaluation

## 2019-01-11 NOTE — Anesthesia Procedure Notes (Signed)
Spinal  Patient location during procedure: ICU End time: 01/11/2019 7:35 AM Staffing Anesthesiologist: Jairo Ben, MD Performed: anesthesiologist  Preanesthetic Checklist Completed: patient identified, surgical consent, pre-op evaluation, timeout performed, IV checked, risks and benefits discussed and monitors and equipment checked Spinal Block Patient position: sitting Prep: site prepped and draped and DuraPrep Patient monitoring: blood pressure, continuous pulse ox, cardiac monitor and heart rate Approach: midline Location: L3-4 Injection technique: single-shot Needle Needle type: Pencan  Needle gauge: 24 G Needle length: 9 cm Assessment Events: paresthesia and transient L paresthesia (needle repositioned) Additional Notes Pt identified in Operating room.  Monitors applied. Working IV access confirmed. Sterile prep, drape lumbar spine.  1% lido local L 3,4.  #24ga Pencan, transient L paresthesia, needle repositioned and into clear CSF L 3,4.  12mg  0.75% Bupivacaine with dextrose, fentanyl, morphine injected with asp CSF beginning and end of injection.  Patient asymptomatic, VSS, no heme aspirated, tolerated well.  Sandford Craze, MD

## 2019-01-11 NOTE — Anesthesia Postprocedure Evaluation (Signed)
Anesthesia Post Note  Patient: Kelly [name]  Procedure(s) Performed: CESAREAN SECTION (N/A Abdomen)     Patient location during evaluation: Mother Baby Anesthesia Type: Spinal Level of consciousness: awake and alert and oriented Pain management: satisfactory to patient Vital Signs Assessment: post-procedure vital signs reviewed and stable Respiratory status: respiratory function stable Cardiovascular status: stable Postop Assessment: no headache, no backache, epidural receding, patient able to bend at knees, no signs of nausea or vomiting and adequate PO intake Anesthetic complications: no    Last Vitals:  Vitals:   01/11/19 1155 01/11/19 1302  BP: 111/72 102/66  Pulse: 66 65  Resp: 18 18  Temp: 36.7 C 36.7 C  SpO2: 97% 97%    Last Pain:  Vitals:   01/11/19 1302  TempSrc: Axillary  PainSc: 1    Pain Goal:                   Siddiq Kaluzny

## 2019-01-11 NOTE — Op Note (Signed)
Cesarean Section Procedure Note   Kelly [name]  01/11/2019  Indications: Scheduled Proceedure/Maternal Request   Pre-operative Diagnosis: PREVIOUS X 1.   Post-operative Diagnosis: Same   Surgeon: Surgeon(s) and Role:    Zelphia Cairo, MD - Primary   Assistants: Genice Rouge, RNFA  Anesthesia: spinal   Procedure Details:  The patient was seen in the Holding Room. The risks, benefits, complications, treatment options, and expected outcomes were discussed with the patient. The patient concurred with the proposed plan, giving informed consent. identified as Kelly [name] and the procedure verified as C-Section Delivery. A Time Out was held and the above information confirmed.  After induction of anesthesia, the patient was draped and prepped in the usual sterile manner. A transverse was made and carried down through the subcutaneous tissue to the fascia. Fascial incision was made and extended transversely. The fascia was separated from the underlying rectus tissue superiorly and inferiorly. The peritoneum was identified and entered. Peritoneal incision was extended longitudinally. The utero-vesical peritoneal reflection was incised transversely and the bladder flap was bluntly freed from the lower uterine segment. A low transverse uterine incision was made. Delivered from cephalic presentation was a viable female infant. Cord ph was not sent the umbilical cord was clamped and cut cord blood was obtained for evaluation. The placenta was removed Intact and appeared normal. The uterine outline, tubes and ovaries appeared normal}. The uterine incision was closed with running locked sutures of 0chromic gut.   Hemostasis was observed. Lavage was carried out until clear. Peritoneum closed with 0 monocryl.  The fascia was then reapproximated with running sutures of 0PDS. The skin was closed with 4-0Vicryl.   Instrument, sponge, and needle counts were correct prior the abdominal  closure and were correct at the conclusion of the case.     Estimated Blood Loss: pending evaluation (<500cc)  Urine Output: clear  Specimens: placenta for disposal  Complications: no complications  Disposition: PACU - hemodynamically stable.   Maternal Condition: stable   Baby condition / location:  Couplet care / Skin to Skin  Attending Attestation: I was present for the entire procedure.   Signed: Surgeon(s): Zelphia Cairo, MD

## 2019-01-11 NOTE — Anesthesia Postprocedure Evaluation (Signed)
Anesthesia Post Note  Patient: Moo Holzmann  Procedure(s) Performed: CESAREAN SECTION (N/A Abdomen)     Patient location during evaluation: PACU Anesthesia Type: Spinal Level of consciousness: awake and alert, patient cooperative and oriented Pain management: pain level controlled Vital Signs Assessment: post-procedure vital signs reviewed and stable Respiratory status: spontaneous breathing, nonlabored ventilation and respiratory function stable Cardiovascular status: blood pressure returned to baseline and stable Postop Assessment: spinal receding, patient able to bend at knees and no apparent nausea or vomiting Anesthetic complications: no    Last Vitals:  Vitals:   01/11/19 0900 01/11/19 0915  BP: 112/72 108/68  Pulse: 64 65  Resp: 20 17  Temp:    SpO2: 98% 98%    Last Pain:  Vitals:   01/11/19 0839  TempSrc: Oral   Pain Goal:    LLE Motor Response: Purposeful movement (01/11/19 0915)   RLE Motor Response: Purposeful movement (01/11/19 0915)       Epidural/Spinal Function Cutaneous sensation: Tingles (01/11/19 0915), Patient able to flex knees: Yes (01/11/19 0915), Patient able to lift hips off bed: No (01/11/19 0915), Back pain beyond tenderness at insertion site: No (01/11/19 0915), Progressively worsening motor and/or sensory loss: No (01/11/19 0915), Bowel and/or bladder incontinence post epidural: No (01/11/19 0915)  Miliano Cotten,E. Raywood Wailes

## 2019-01-11 NOTE — Addendum Note (Signed)
Addendum  created 01/11/19 1437 by Graciela Husbands, CRNA   Clinical Note Signed

## 2019-01-11 NOTE — Transfer of Care (Signed)
Immediate Anesthesia Transfer of Care Note  Patient: Kelly [name]  Procedure(s) Performed: CESAREAN SECTION (N/A Abdomen)  Patient Location: PACU  Anesthesia Type:Spinal  Level of Consciousness: patient cooperative  Airway & Oxygen Therapy: Patient Spontanous Breathing  Post-op Assessment: Report given to RN, Post -op Vital signs reviewed and stable and Patient moving all extremities X 4  Post vital signs: Reviewed and stable  Last Vitals:  Vitals Value Taken Time  BP 136/86 01/11/2019  8:39 AM  Temp    Pulse 67 01/11/2019  8:44 AM  Resp 17 01/11/2019  8:44 AM  SpO2 98 % 01/11/2019  8:44 AM  Vitals shown include unvalidated device data.  Last Pain:  Vitals:   01/11/19 0551  TempSrc: Oral         Complications: No apparent anesthesia complications

## 2019-01-12 ENCOUNTER — Encounter (HOSPITAL_COMMUNITY): Payer: Self-pay | Admitting: *Deleted

## 2019-01-12 LAB — CBC
HCT: 35.3 % — ABNORMAL LOW (ref 36.0–46.0)
Hemoglobin: 11.8 g/dL — ABNORMAL LOW (ref 12.0–15.0)
MCH: 31.7 pg (ref 26.0–34.0)
MCHC: 33.4 g/dL (ref 30.0–36.0)
MCV: 94.9 fL (ref 80.0–100.0)
Platelets: 124 10*3/uL — ABNORMAL LOW (ref 150–400)
RBC: 3.72 MIL/uL — ABNORMAL LOW (ref 3.87–5.11)
RDW: 16.6 % — ABNORMAL HIGH (ref 11.5–15.5)
WBC: 9.5 10*3/uL (ref 4.0–10.5)

## 2019-01-12 LAB — BIRTH TISSUE RECOVERY COLLECTION (PLACENTA DONATION)

## 2019-01-12 NOTE — Progress Notes (Signed)
Subjective: Postpartum Day 1: Cesarean Delivery Patient reports tolerating PO.  Patient requests circ.  Objective: Vital signs in last 24 hours: Temp:  [97.3 F (36.3 C)-99.9 F (37.7 C)] 97.3 F (36.3 C) (02/11 0752) Pulse Rate:  [64-90] 65 (02/11 0752) Resp:  [15-20] 18 (02/11 0752) BP: (93-136)/(53-86) 100/56 (02/11 0752) SpO2:  [97 %-99 %] 99 % (02/11 0752)  Physical Exam:  General: alert, cooperative and appears stated age Lochia: appropriate Uterine Fundus: firm Incision: healing well, no significant drainage, no dehiscence DVT Evaluation: No evidence of DVT seen on physical exam. Negative Homan's sign. No cords or calf tenderness.  Recent Labs    01/12/19 0528  HGB 11.8*  HCT 35.3*    Assessment/Plan: Status post Cesarean section. Doing well postoperatively.  Continue current care. Circ-pt is counseled re: risk of bleeding, infection, and scarring.  All questions were answered.  Will proceed.  Mitchel Honour 01/12/2019, 7:55 AM

## 2019-01-12 NOTE — Lactation Note (Signed)
This note was copied from a baby's chart. Lactation Consultation Note  Patient Name: Kelly [name] [id number] Date: 01/12/2019 Reason for consult: Follow-up assessment;Term Mom reports feedings are going well and baby is latching with ease.  Feedings comfortable.  Baby just returned from circumcision and he is sleeping in FOB's arms.  Instructed to feed with feeding cues and call for assist prn.  Maternal Data    Feeding    LATCH Score                   Interventions    Lactation Tools Discussed/Used     Consult Status Consult Status: Follow-up Date: 01/13/19 Follow-up type: In-patient    Huston Foley 01/12/2019, 10:38 AM

## 2019-01-13 NOTE — Progress Notes (Signed)
Patient doing well No complaints. BP 110/64   Pulse 77   Temp 98 F (36.7 C) (Oral)   Resp 18   Ht 5\' 4"  (1.626 m)   Wt 91.2 kg   LMP 04/10/2018   SpO2 99%   Breastfeeding Unknown   BMI 34.52 kg/m  No results found for this or any previous visit (from the past 24 hour(s)). Abdomen soft and non tender  POD # 2  Doing well Routine care

## 2019-01-14 ENCOUNTER — Encounter (HOSPITAL_COMMUNITY): Payer: Self-pay | Admitting: *Deleted

## 2019-01-14 MED ORDER — OXYCODONE HCL 5 MG PO TABS: 5.0000 mg | ORAL_TABLET | ORAL | 0 refills | Status: DC | PRN

## 2019-01-14 MED ORDER — IBUPROFEN 600 MG PO TABS: 600.0000 mg | ORAL_TABLET | Freq: Four times a day (QID) | ORAL | 0 refills | Status: DC | PRN

## 2019-01-14 MED ORDER — DOCUSATE SODIUM 100 MG PO CAPS: 100.0000 mg | ORAL_CAPSULE | Freq: Two times a day (BID) | ORAL | 2 refills | Status: DC

## 2019-01-14 NOTE — Lactation Note (Signed)
This note was copied from a baby's chart. Lactation Consultation Note  Patient Name: Kelly [name] OHYWV'P Date: 01/14/2019 Reason for consult: Follow-up assessment;Term Baby is 74 hours old/7% weight loss.  Mom's milk is in and breasts are full.  Mom feels softening with feeding.  Baby just came off breast after a 40 minute feeding.  Baby very relaxed and milk around baby's mouth.  No questions or concerns.  Lactation outpatient services and support information reviewed and encouraged prn.  Maternal Data    Feeding    LATCH Score                   Interventions    Lactation Tools Discussed/Used     Consult Status Consult Status: Complete Follow-up type: Call as needed    Huston Foley 01/14/2019, 10:18 AM

## 2019-01-14 NOTE — Discharge Summary (Signed)
Obstetric Discharge Summary Reason for Admission: cesarean section Prenatal Procedures: none Intrapartum Procedures: cesarean: low cervical, transverse Postpartum Procedures: none Complications-Operative and Postpartum: none Hemoglobin  Date Value Ref Range Status  01/12/2019 11.8 (L) 12.0 - 15.0 g/dL Final   HCT  Date Value Ref Range Status  01/12/2019 35.3 (L) 36.0 - 46.0 % Final    Physical Exam:  General: alert, cooperative and appears stated age 33: appropriate Uterine Fundus: firm Incision: healing well, no significant drainage, no dehiscence, no significant erythema DVT Evaluation: No evidence of DVT seen on physical exam. Negative Homan's sign. No cords or calf tenderness. No significant calf/ankle edema.  Discharge Diagnoses: Term Pregnancy-delivered  Discharge Information: Date: 01/14/2019 Activity: pelvic rest Diet: routine Medications: PNV, Ibuprofen, Colace and oxycodone Condition: stable Instructions: refer to practice specific booklet Discharge to: home   Newborn Data: Live born female  Birth Weight: 8 lb 3.8 oz (3735 g) APGAR: 9, 9  Newborn Delivery   Birth date/time:  01/11/2019 07:55:00 Delivery type:  C-Section, Low Transverse Trial of labor:  No C-section categorization:  Repeat     Home with mother.  Carolena Lownes Edker Punt 01/14/2019, 8:03 AM

## 2019-10-06 ENCOUNTER — Ambulatory Visit: Payer: BC Managed Care – PPO

## 2019-10-08 ENCOUNTER — Other Ambulatory Visit: Payer: Self-pay

## 2019-10-08 DIAGNOSIS — Z20822 Contact with and (suspected) exposure to covid-19: Secondary | ICD-10-CM

## 2019-10-10 LAB — NOVEL CORONAVIRUS, NAA: SARS-CoV-2, NAA: NOT DETECTED

## 2019-10-11 ENCOUNTER — Other Ambulatory Visit: Payer: Self-pay

## 2019-10-13 ENCOUNTER — Ambulatory Visit (INDEPENDENT_AMBULATORY_CARE_PROVIDER_SITE_OTHER): Payer: BC Managed Care – PPO

## 2019-10-13 DIAGNOSIS — Z23 Encounter for immunization: Secondary | ICD-10-CM | POA: Diagnosis not present

## 2019-10-22 ENCOUNTER — Other Ambulatory Visit: Payer: Self-pay

## 2019-10-22 DIAGNOSIS — Z20822 Contact with and (suspected) exposure to covid-19: Secondary | ICD-10-CM

## 2019-10-22 NOTE — Addendum Note (Signed)
Addended by: Danielle Dess on: 10/22/2019 12:41 PM   Modules accepted: Orders

## 2019-10-25 LAB — NOVEL CORONAVIRUS, NAA: SARS-CoV-2, NAA: NOT DETECTED

## 2020-03-23 ENCOUNTER — Telehealth (INDEPENDENT_AMBULATORY_CARE_PROVIDER_SITE_OTHER): Payer: BC Managed Care – PPO | Admitting: Family

## 2020-03-23 ENCOUNTER — Encounter: Payer: Self-pay | Admitting: Family

## 2020-03-23 DIAGNOSIS — J019 Acute sinusitis, unspecified: Secondary | ICD-10-CM

## 2020-03-23 MED ORDER — AMOXICILLIN-POT CLAVULANATE 875-125 MG PO TABS: 1.0000 | ORAL_TABLET | Freq: Two times a day (BID) | ORAL | 0 refills | Status: DC

## 2020-03-23 NOTE — Progress Notes (Signed)
   Virtual Visit via telephone Note Due to COVID-19 pandemic this visit was conducted virtually. This visit type was conducted due to national recommendations for restrictions regarding the COVID-19 Pandemic (e.g. social distancing, sheltering in place) in an effort to limit this patient's exposure and mitigate transmission in our community. All issues noted in this document were discussed and addressed.  A physical exam was not performed with this format.  I connected with Kelly [name] on 03/23/20 at 1:10 pm  by telephone and verified that I am speaking with the correct person using two identifiers. Kelly [name] is currently located at school and no one is currently with her during visit. The provider, [name], [name] is located in their office at time of visit.  I discussed the limitations, risks, security and privacy concerns of performing an evaluation and management service by telephone and the availability of in person appointments. I also discussed with the patient that there may be a patient responsible charge related to this service. The patient expressed understanding and agreed to proceed.   History and Present Illness:  Sinusitis This is a new problem. The current episode started 1 to 4 weeks ago. The problem has been gradually worsening since onset. There has been no fever. Her pain is at a severity of 5/10. The pain is mild. Associated symptoms include congestion, headaches, sinus pressure, sneezing and a sore throat. Pertinent negatives include no chills, coughing, ear pain or hoarse voice. Treatments tried: zyrtec and benadryl. The treatment provided mild relief.      Review of Systems  Constitutional: Negative for chills.  HENT: Positive for congestion, sinus pressure, sneezing and sore throat. Negative for ear pain and hoarse voice.   Respiratory: Negative for cough.   Neurological: Positive for headaches.  All other systems reviewed and are  negative.    Observations/Objective: No SOB or distress noted  Assessment and Plan: 1. Acute sinusitis, recurrence not specified, unspecified location - Take meds as prescribed - Use a cool mist humidifier  -Use saline nose sprays frequently -Force fluids -For any cough or congestion  Use plain Mucinex- regular strength or max strength is fine -For fever or aces or pains- take tylenol or ibuprofen. -Throat lozenges if help -RTO as needed if symptoms worsen or do not improve  - amoxicillin-clavulanate (AUGMENTIN) 875-125 MG tablet; Take 1 tablet by mouth 2 (two) times daily.  Dispense: 14 tablet; Refill: 0     I discussed the assessment and treatment plan with the patient. The patient was provided an opportunity to ask questions and all were answered. The patient agreed with the plan and demonstrated an understanding of the instructions.   The patient was advised to call back or seek an in-person evaluation if the symptoms worsen or if the condition fails to improve as anticipated.  The above assessment and management plan was discussed with the patient. The patient verbalized understanding of and has agreed to the management plan. Patient is aware to call the clinic if symptoms persist or worsen. Patient is aware when to return to the clinic for a follow-up visit. Patient educated on when it is appropriate to go to the emergency department.   Time call ended:  1:19 pm   Her video was not working.   I provided  9 minutes of non-face-to-face time during this encounter.    [name], [name]

## 2020-04-14 ENCOUNTER — Ambulatory Visit: Payer: BC Managed Care – PPO | Attending: Internal Medicine

## 2020-04-14 DIAGNOSIS — Z23 Encounter for immunization: Secondary | ICD-10-CM

## 2020-04-14 NOTE — Progress Notes (Signed)
   Covid-19 Vaccination Clinic  Name:  Geraldina Parrott    MRN: [id number] DOB: 05-30-1986  04/14/2020  Ms. Moffa was observed post Covid-19 immunization for 15 minutes without incident. She was provided with Vaccine Information Sheet and instruction to access the V-Safe system.   Ms. Remigio was instructed to call 911 with any severe reactions post vaccine: Marland Kitchen Difficulty breathing  . Swelling of face and throat  . A fast heartbeat  . A bad rash all over body  . Dizziness and weakness   Immunizations Administered    Name Date Dose VIS Date Route   Pfizer COVID-19 Vaccine 04/14/2020  3:19 PM 0.3 mL 01/26/2019 Intramuscular   Manufacturer: [employer], [employer]   Lot: [id number]   NDC: [address]-2

## 2020-05-05 ENCOUNTER — Ambulatory Visit: Payer: BC Managed Care – PPO

## 2020-05-30 ENCOUNTER — Encounter: Payer: Self-pay | Admitting: Family Medicine

## 2020-05-30 ENCOUNTER — Ambulatory Visit: Payer: BC Managed Care – PPO | Admitting: Family Medicine

## 2020-05-30 ENCOUNTER — Other Ambulatory Visit: Payer: Self-pay

## 2020-05-30 VITALS — BP 104/68 | HR 68 | Temp 97.2°F | Ht 64.0 in | Wt 185.6 lb

## 2020-05-30 DIAGNOSIS — R3915 Urgency of urination: Secondary | ICD-10-CM | POA: Diagnosis not present

## 2020-05-30 DIAGNOSIS — N3000 Acute cystitis without hematuria: Secondary | ICD-10-CM

## 2020-05-30 LAB — URINALYSIS, COMPLETE
Bilirubin, UA: NEGATIVE
Glucose, UA: NEGATIVE
Ketones, UA: NEGATIVE
Nitrite, UA: NEGATIVE
Protein,UA: NEGATIVE
RBC, UA: NEGATIVE
Specific Gravity, UA: 1.01 (ref 1.005–1.030)
Urobilinogen, Ur: 0.2 mg/dL (ref 0.2–1.0)
pH, UA: 5 (ref 5.0–7.5)

## 2020-05-30 LAB — MICROSCOPIC EXAMINATION
Epithelial Cells (non renal): >10 /hpf — AB (ref 0–10)
RBC, Urine: NONE SEEN /hpf (ref 0–2)
Renal Epithel, UA: NONE SEEN /hpf

## 2020-05-30 MED ORDER — NITROFURANTOIN MONOHYD MACRO 100 MG PO CAPS: 100.0000 mg | ORAL_CAPSULE | Freq: Two times a day (BID) | ORAL | 0 refills | Status: AC

## 2020-05-30 NOTE — Patient Instructions (Signed)
Urinary Tract Infection, Adult A urinary tract infection (UTI) is an infection of any part of the urinary tract. The urinary tract includes:  The kidneys.  The ureters.  The bladder.  The urethra. These organs make, store, and get rid of pee (urine) in the body. What are the causes? This is caused by germs (bacteria) in your genital area. These germs grow and cause swelling (inflammation) of your urinary tract. What increases the risk? You are more likely to develop this condition if:  You have a small, thin tube (catheter) to drain pee.  You cannot control when you pee or poop (incontinence).  You are female, and: ? You use these methods to prevent pregnancy:  A medicine that kills sperm (spermicide).  A device that blocks sperm (diaphragm). ? You have low levels of a female hormone (estrogen). ? You are pregnant.  You have genes that add to your risk.  You are sexually active.  You take antibiotic medicines.  You have trouble peeing because of: ? A prostate that is bigger than normal, if you are female. ? A blockage in the part of your body that drains pee from the bladder (urethra). ? A kidney stone. ? A nerve condition that affects your bladder (neurogenic bladder). ? Not getting enough to drink. ? Not peeing often enough.  You have other conditions, such as: ? Diabetes. ? A weak disease-fighting system (immune system). ? Sickle cell disease. ? Gout. ? Injury of the spine. What are the signs or symptoms? Symptoms of this condition include:  Needing to pee right away (urgently).  Peeing often.  Peeing small amounts often.  Pain or burning when peeing.  Blood in the pee.  Pee that smells bad or not like normal.  Trouble peeing.  Pee that is cloudy.  Fluid coming from the vagina, if you are female.  Pain in the belly or lower back. Other symptoms include:  Throwing up (vomiting).  No urge to eat.  Feeling mixed up (confused).  Being tired  and grouchy (irritable).  A fever.  Watery poop (diarrhea). How is this treated? This condition may be treated with:  Antibiotic medicine.  Other medicines.  Drinking enough water. Follow these instructions at home:  Medicines  Take over-the-counter and prescription medicines only as told by your doctor.  If you were prescribed an antibiotic medicine, take it as told by your doctor. Do not stop taking it even if you start to feel better. General instructions  Make sure you: ? Pee until your bladder is empty. ? Do not hold pee for a long time. ? Empty your bladder after sex. ? Wipe from front to back after pooping if you are a female. Use each tissue one time when you wipe.  Drink enough fluid to keep your pee pale yellow.  Keep all follow-up visits as told by your doctor. This is important. Contact a doctor if:  You do not get better after 1-2 days.  Your symptoms go away and then come back. Get help right away if:  You have very bad back pain.  You have very bad pain in your lower belly.  You have a fever.  You are sick to your stomach (nauseous).  You are throwing up. Summary  A urinary tract infection (UTI) is an infection of any part of the urinary tract.  This condition is caused by germs in your genital area.  There are many risk factors for a UTI. These include having a small, thin   tube to drain pee and not being able to control when you pee or poop.  Treatment includes antibiotic medicines for germs.  Drink enough fluid to keep your pee pale yellow. This information is not intended to replace advice given to you by your health care provider. Make sure you discuss any questions you have with your health care provider. Document Revised: 11/05/2018 Document Reviewed: 05/28/2018 Elsevier Patient Education  2020 [employer]  

## 2020-05-30 NOTE — Progress Notes (Signed)
   Assessment & Plan:  1. Acute cystitis without hematuria - Education provided on UTIs. Encouraged adequate hydration.  - nitrofurantoin, macrocrystal-monohydrate, (MACROBID) 100 MG capsule; Take 1 capsule (100 mg total) by mouth 2 (two) times daily for 5 days. 1 po BId  Dispense: 10 capsule; Refill: 0 - Urine Culture  2. Urinary urgency - Urinalysis, Complete - Urine dipstick shows positive for leukocytes.  Micro exam: 6-10 WBC's per HPF and many bacteria.   Follow up plan: Return if symptoms worsen or fail to improve.  [name], MSN, APRN, [name]-C [facility]  Subjective:   Patient ID: Kelly [name], female    DOB: 07-25-1986, 34 y.o.   MRN: [id number]  HPI: Kelly [name] is a 34 y.o. female presenting on 05/30/2020 for Urinary Urgency (x 2 days.  Patient has been taking Azo.)  Urinary Tract Infection: Patient complains of urgency. She has had symptoms for 2 days. Patient denies back pain and fever. Patient does have a history of recurrent UTI.  Patient does not have a history of pyelonephritis.    ROS: Negative unless specifically indicated above in HPI.   Relevant past medical history reviewed and updated as indicated.   Allergies and medications reviewed and updated.   Current Outpatient Medications:  .  LO LOESTRIN FE 1 MG-10 MCG / 10 MCG tablet, Take 1 tablet by mouth daily., Disp: , Rfl:   No Known Allergies  Objective:   BP 104/68   Pulse 68   Temp (!) 97.2 F (36.2 C) (Temporal)   Ht 5\' 4"  (1.626 m)   Wt 185 lb 9.6 oz (84.2 kg)   LMP 04/29/2020 (Approximate)   SpO2 99%   BMI 31.86 kg/m    Physical Exam Vitals reviewed.  Constitutional:      General: She is not in acute distress.    Appearance: Normal appearance. She is not ill-appearing, toxic-appearing or diaphoretic.  HENT:     Head: Normocephalic and atraumatic.  Eyes:     General: No scleral icterus.       Right eye: No discharge.        Left eye:  No discharge.     Conjunctiva/sclera: Conjunctivae normal.  Cardiovascular:     Rate and Rhythm: Normal rate.  Pulmonary:     Effort: Pulmonary effort is normal. No respiratory distress.  Abdominal:     Tenderness: There is no right CVA tenderness or left CVA tenderness.  Musculoskeletal:        General: Normal range of motion.     Cervical back: Normal range of motion.  Skin:    General: Skin is warm and dry.     Capillary Refill: Capillary refill takes less than 2 seconds.  Neurological:     General: No focal deficit present.     Mental Status: She is alert and oriented to person, place, and time. Mental status is at baseline.  Psychiatric:        Mood and Affect: Mood normal.        Behavior: Behavior normal.        Thought Content: Thought content normal.        Judgment: Judgment normal.

## 2020-06-01 LAB — URINE CULTURE

## 2020-09-04 ENCOUNTER — Ambulatory Visit (INDEPENDENT_AMBULATORY_CARE_PROVIDER_SITE_OTHER): Payer: BC Managed Care – PPO | Admitting: Family

## 2020-09-04 ENCOUNTER — Encounter: Payer: Self-pay | Admitting: Family

## 2020-09-04 DIAGNOSIS — Z3A08 8 weeks gestation of pregnancy: Secondary | ICD-10-CM

## 2020-09-04 DIAGNOSIS — J329 Chronic sinusitis, unspecified: Secondary | ICD-10-CM | POA: Diagnosis not present

## 2020-09-04 MED ORDER — AMOXICILLIN-POT CLAVULANATE 875-125 MG PO TABS: 1.0000 | ORAL_TABLET | Freq: Two times a day (BID) | ORAL | 0 refills | Status: DC

## 2020-09-04 NOTE — Progress Notes (Signed)
   Virtual Visit via telephone Note Due to COVID-19 pandemic this visit was conducted virtually. This visit type was conducted due to national recommendations for restrictions regarding the COVID-19 Pandemic (e.g. social distancing, sheltering in place) in an effort to limit this patient's exposure and mitigate transmission in our community. All issues noted in this document were discussed and addressed.  A physical exam was not performed with this format.  I connected with Kelly [name] on 09/04/20 at 1:31 pm  by telephone and verified that I am speaking with the correct person using two identifiers. Kelly [name] is currently located at home and no one is currently with her during visit. The provider, [name], [name] is located in their office at time of visit.  I discussed the limitations, risks, security and privacy concerns of performing an evaluation and management service by telephone and the availability of in person appointments. I also discussed with the patient that there may be a patient responsible charge related to this service. The patient expressed understanding and agreed to proceed.   History and Present Illness:  Sinusitis This is a new problem. The current episode started in the past 7 days. The problem has been waxing and waning since onset. There has been no fever. Associated symptoms include congestion, coughing ("slight"), headaches, sinus pressure and sneezing. Pertinent negatives include no chills, ear pain, shortness of breath, sore throat or swollen glands. (Teeth pain) Past treatments include acetaminophen. The treatment provided mild relief.      Review of Systems  Constitutional: Negative for chills.  HENT: Positive for congestion, sinus pressure and sneezing. Negative for ear pain and sore throat.   Respiratory: Positive for cough ("slight"). Negative for shortness of breath.   Neurological: Positive for headaches.  All other systems  reviewed and are negative.    Observations/Objective: No SOB or distress noted, hoarse voice   Assessment and Plan: Romie Keeble comes in today with chief complaint of No chief complaint on file.   Diagnosis and orders addressed:  1. Sinusitis, unspecified chronicity, unspecified location - Novel Coronavirus, NAA ([facility]); Future  2. [redacted] weeks gestation of pregnancy - Novel Coronavirus, NAA ([facility]); Future  Sounds like sinus, but given pregnancy we will rule out Continue daily Claritin Avoid allergens Force fluids Quarantine until results return  I discussed the assessment and treatment plan with the patient. The patient was provided an opportunity to ask questions and all were answered. The patient agreed with the plan and demonstrated an understanding of the instructions.   The patient was advised to call back or seek an in-person evaluation if the symptoms worsen or if the condition fails to improve as anticipated.  The above assessment and management plan was discussed with the patient. The patient verbalized understanding of and has agreed to the management plan. Patient is aware to call the clinic if symptoms persist or worsen. Patient is aware when to return to the clinic for a follow-up visit. Patient educated on when it is appropriate to go to the emergency department.   Time call ended:  1:43 pm   I provided  12 minutes of non-face-to-face time during this encounter.    [name], [name]

## 2020-09-04 NOTE — Addendum Note (Signed)
Addended by: Prescott Gum on: 09/04/2020 04:55 PM   Modules accepted: Orders

## 2020-09-04 NOTE — Addendum Note (Signed)
Addended by: Lorelee Cover C on: 09/04/2020 04:57 PM   Modules accepted: Orders

## 2020-09-05 LAB — NOVEL CORONAVIRUS, NAA: SARS-CoV-2, NAA: NOT DETECTED

## 2020-09-05 LAB — SARS-COV-2, NAA 2 DAY TAT

## 2020-11-13 ENCOUNTER — Telehealth: Payer: Self-pay

## 2020-11-13 DIAGNOSIS — I63532 Cerebral infarction due to unspecified occlusion or stenosis of left posterior cerebral artery: Secondary | ICD-10-CM

## 2020-11-13 NOTE — Telephone Encounter (Signed)
Spoke to patient's spouse, Alycia Rossetti, after his wife requested me to do so.  She is needing referral to physical therapy and occupational therapy at [facility] as she is status post CVA of the PCA.  He asked that her hospital follow-up be changed to video visit as she has very limited mobility at this time.  I have made this change and will CC the provider seeing her on Friday about this.

## 2020-11-13 NOTE — Telephone Encounter (Signed)
REFERRAL REQUEST Telephone Note  Have you been seen at our office for this problem? no (Advise that they may need an appointment with their PCP before a referral can be done)  Reason for Referral: Stroke & pregnant Referral discussed with patient: discussed at Hospital that she went to-[facility] Best contact number of patient for referral team:   #[phone] Has patient been seen by a specialist for this issue before: no Patient provider preference for referral:  ? Patient location preference for referral: [place]   Patient notified that referrals can take up to a week or longer to process. If they haven't heard anything within a week they should call back and speak with the referral department.   [facility]tinger's pt.  Husband can't get Hosp to do referral & wife needs Occupational therapy in order to walk & use her right arm & tingling in face some.  Please call call husband today.  He would like to get this started ASAP.

## 2020-11-14 NOTE — Telephone Encounter (Signed)
fyi

## 2020-11-17 ENCOUNTER — Telehealth (INDEPENDENT_AMBULATORY_CARE_PROVIDER_SITE_OTHER): Payer: BC Managed Care – PPO | Admitting: Family

## 2020-11-17 ENCOUNTER — Encounter: Payer: Self-pay | Admitting: Family

## 2020-11-17 DIAGNOSIS — I6930 Unspecified sequelae of cerebral infarction: Secondary | ICD-10-CM

## 2020-11-17 DIAGNOSIS — R531 Weakness: Secondary | ICD-10-CM | POA: Diagnosis not present

## 2020-11-17 DIAGNOSIS — Z09 Encounter for follow-up examination after completed treatment for conditions other than malignant neoplasm: Secondary | ICD-10-CM

## 2020-11-17 DIAGNOSIS — Z3A17 17 weeks gestation of pregnancy: Secondary | ICD-10-CM

## 2020-11-17 DIAGNOSIS — F411 Generalized anxiety disorder: Secondary | ICD-10-CM

## 2020-11-17 NOTE — Progress Notes (Signed)
   Virtual Visit via telephone Note Due to COVID-19 pandemic this visit was conducted virtually. This visit type was conducted due to national recommendations for restrictions regarding the COVID-19 Pandemic (e.g. social distancing, sheltering in place) in an effort to limit this patient's exposure and mitigate transmission in our community. All issues noted in this document were discussed and addressed.  A physical exam was not performed with this format.  I connected with Kelly [name] on 11/17/20 at 2:03 pm  by telephone and verified that I am speaking with the correct person using two identifiers. Kelly [name] is currently located at car and husband is currently with her during visit. The provider, [name], [name] is located in their office at time of visit.  I discussed the limitations, risks, security and privacy concerns of performing an evaluation and management service by telephone and the availability of in person appointments. I also discussed with the patient that there may be a patient responsible charge related to this service. The patient expressed understanding and agreed to proceed.   History and Present Illness:  HPI  Pt's husband calls the office requesting referral to PT. She is 17 weeks 6 days pregnant. She had a CVA  11/06/20 and is followed by Neurologists. She is currently on Lovenox BID and aspirin 81 mg daily.   She is having right side weakness.   She was having increase anxiety, but that has improved over the last few days. Has not taken the hydroxyzine.   She has her first appt with  PT and OT 11/21/20.   She was seeing GYN at [facility], but started this week seeing GYN at [facility]   Review of Systems  All other systems reviewed and are negative.    Observations/Objective: No SOB or distress noted, husband did most of the talking.   Assessment and Plan: 1. Late effect of cerebrovascular accident (CVA)   2. Right sided  weakness  3. GAD (generalized anxiety disorder)  4. [redacted] weeks gestation of pregnancy  5. Hospital discharge follow-up  Continue PT and OT Handicap form completed Keep follow up with Neurologists and GYN! Continue current medications        I discussed the assessment and treatment plan with the patient. The patient was provided an opportunity to ask questions and all were answered. The patient agreed with the plan and demonstrated an understanding of the instructions.   The patient was advised to call back or seek an in-person evaluation if the symptoms worsen or if the condition fails to improve as anticipated.  The above assessment and management plan was discussed with the patient. The patient verbalized understanding of and has agreed to the management plan. Patient is aware to call the clinic if symptoms persist or worsen. Patient is aware when to return to the clinic for a follow-up visit. Patient educated on when it is appropriate to go to the emergency department.   Time call ended:  2:24 pm   I provided 21 minutes of non-face-to-face time during this encounter.    [name], [name]

## 2020-11-21 ENCOUNTER — Ambulatory Visit (HOSPITAL_COMMUNITY): Payer: BC Managed Care – PPO

## 2020-11-21 ENCOUNTER — Ambulatory Visit (HOSPITAL_COMMUNITY): Payer: BC Managed Care – PPO | Attending: Family Medicine | Admitting: Physical Therapy

## 2020-11-21 ENCOUNTER — Encounter (HOSPITAL_COMMUNITY): Payer: Self-pay

## 2020-11-21 ENCOUNTER — Encounter (HOSPITAL_COMMUNITY): Payer: Self-pay | Admitting: Physical Therapy

## 2020-11-21 ENCOUNTER — Other Ambulatory Visit: Payer: Self-pay

## 2020-11-21 DIAGNOSIS — R2689 Other abnormalities of gait and mobility: Secondary | ICD-10-CM | POA: Diagnosis present

## 2020-11-21 DIAGNOSIS — R278 Other lack of coordination: Secondary | ICD-10-CM | POA: Diagnosis present

## 2020-11-21 DIAGNOSIS — R29898 Other symptoms and signs involving the musculoskeletal system: Secondary | ICD-10-CM | POA: Diagnosis present

## 2020-11-21 DIAGNOSIS — R262 Difficulty in walking, not elsewhere classified: Secondary | ICD-10-CM | POA: Diagnosis present

## 2020-11-21 DIAGNOSIS — M6281 Muscle weakness (generalized): Secondary | ICD-10-CM | POA: Insufficient documentation

## 2020-11-21 NOTE — Therapy (Signed)
[facility] [address] [place], [place], [address] Phone: [phone]   Fax:  [phone]  Physical Therapy Evaluation  Patient Details  Name: Kelly LittenJennifer Bailey [name] MRN: [id number] Date of Birth: 11/02/1986 Referring Provider (PT): Raliegh IpAshly M Gottschalk, DO   Encounter Date: 11/21/2020   PT End of Session - 11/21/20 1444    Visit Number 1    Number of Visits 16    Date for PT Re-Evaluation 01/16/21    [facility] Type [facility], no auth required, no VL    Progress Note Due on Visit 10    PT Start Time 1445    PT Stop Time 1518    PT Time Calculation (min) 33 min    Equipment Utilized During Treatment Gait belt    Activity Tolerance Patient tolerated treatment well           Past Medical History:  Diagnosis Date  . Headache   . Missed abortion 05/2015   no surgery required  . PONV (postoperative nausea and vomiting)     Past Surgical History:  Procedure Laterality Date  . CESAREAN SECTION N/A 09/12/2016   Procedure: CESAREAN SECTION;  Surgeon: Zelphia CairoGretchen Adkins, MD;  [facility];  Service: Obstetrics;  Laterality: N/A;  RNFA  . CESAREAN SECTION N/A 01/11/2019   Procedure: CESAREAN SECTION;  Surgeon: Zelphia CairoAdkins, Gretchen, MD;  [facility];  Service: Obstetrics;  Laterality: N/A;  Repeat edc 01/15/19 NKDA [name] RNFA  . DILATION AND EVACUATION N/A 12/23/2017   Procedure: DILATATION AND EVACUATION with Intraoperative Ultrasound ;  Surgeon: Zelphia CairoAdkins, Gretchen, MD;  Location: [facility];  Service: Gynecology;  Laterality: N/A;  . WISDOM TOOTH EXTRACTION      There were no vitals filed for this visit.    Subjective Assessment - 11/21/20 1456    Subjective Patient had stroke on 11/06/20 and reports she was at work and noticed visual disturbance initially followed by onset of RUE/RLE weakness a few days later.  Patient reports her weakness has somewhat subsided and she has been walking better the  past three days.  Patient reports better leg control than arm control.  Patient reports that she lives with family who can assist    Patient is accompained by: Family member   mother   Currently in Pain? No/denies              [facility] - 11/21/20 0001      Assessment   Medical Diagnosis CVA, weakness    Referring Provider (PT) Raliegh IpAshly M Gottschalk, DO      Precautions   Precautions --   [redacted] weeks pregnant at time of evaluation     Balance Screen   Has the patient fallen in the past 6 months No    Has the patient had a decrease in activity level because of a fear of falling?  No    Is the patient reluctant to leave their home because of a fear of falling?  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      Sensation   Light Touch Appears Intact    Proprioception Impaired by gross assessment      Coordination   Gross Motor Movements are Fluid and Coordinated No    Fine Motor Movements are Fluid and Coordinated No    Coordination and Movement Description LE ataxia    Heel Shin Test RLE deficits      Tone   Assessment Location Right Lower Extremity      ROM / Strength   AROM / PROM / Strength Strength      Strength   Strength Assessment Site Hip;Knee;Ankle    Right/Left Hip Right;Left    Right Hip Flexion 4/5    Left Hip Flexion 5/5    Right/Left Knee Left;Right    Right Knee Flexion 4/5    Right Knee Extension 3+/5    Left Knee Flexion 5/5    Left Knee Extension 5/5    Right/Left Ankle Right;Left    Right Ankle Dorsiflexion 4+/5    Right Ankle Plantar Flexion  4/5    Left Ankle Dorsiflexion 5/5    Left Ankle Plantar Flexion 5/5      Transfers   Transfers Sit to Stand    Sit to Stand 5: Supervision      Ambulation/Gait   Ambulation/Gait Assistance 4: Min guard    Ambulation Distance (Feet) 282 Feet    Assistive device None    Gait Pattern Ataxic;Decreased stride length;Decreased arm swing - right;Decreased step length - left;Step-to pattern    Ambulation Surface Level    Gait velocity decreased    Gait Comments      Balance   Balance Assessed Yes      Standardized Balance Assessment   Standardized Balance Assessment Five Times Sit to Stand   15.75 sec     RLE Tone   RLE Tone Hypertonic                      Objective measurements completed on examination: See above findings.       [facility] Adult PT Treatment/Exercise - 11/21/20 0001      Transfers   Number of Reps Other reps (comment)   5   Transfer Cueing cues for symmetry and eccentric lowering      Ambulation/Gait   Ambulation/Gait Yes   continued with gait training after evaluation with training/instruction in use of NBQC and cane to facilitate sequence                 PT Education - 11/21/20 1513    Education Details patient educated on use of NBQC vs cane and technique to coordinate sequence    Person(s) Educated Patient    Methods Explanation;Demonstration    Comprehension Verbalized understanding;Returned demonstration            PT Short Term Goals - 11/21/20 1546      PT SHORT TERM GOAL #1   Title Patient will increase RLE strength to 5/5 to promote ambulation without AD    Baseline 3+/5 to 4/5 RLE    Time 4    Period Weeks    Status New    Target Date 12/19/20      PT SHORT TERM GOAL #2   Title Patient will decrease risk for falls as evidenced by time of 13 seconds for 5 Times Sit to Stand test    Baseline 15.75 sec  Time 4    Period Weeks    Status New    Target Date 12/19/20      PT SHORT TERM GOAL #3   Title  Patient will ambulate 500 ft without AD and normalized gait pattern during    Baseline 256 ft with CGA    Time 4    Period Weeks    Status New    Target Date 12/19/20             PT Long Term Goals - 11/21/20 1548      PT LONG TERM GOAL #1   Title Patient will be able to ambulate independently with normalized gait pattern x 600 ft during    Baseline 256 ft w/ CGA    Time 8    Period Weeks    Status New    Target Date 01/16/21      PT LONG TERM GOAL #2   Title Patient will demonstrate low risk for falls per score of 22/24 Dynamic Gait Index    Baseline DNT    Time 8    Period Weeks    Status New    Target Date 01/16/21      PT LONG TERM GOAL #3   Title Patient will be independent in complex HEP to improve self-efficacy    Baseline in development    Time 8    Period Weeks    Status New    Target Date 01/16/21                  Plan - 11/21/20 1533    Clinical Impression Statement Patient exhibits RUE/RLE ataxia, gross motor coordination deficits, dysfunction in selective movements, generalized weakness, difficulty in walking, and balance deficits affecting functional ambulation and demonstrating higher risk for falls requiring PT services to increase functional independence and safety with transfers and ambulation to normalize gait pattern.  Treatment focus will require skilled services to facilitate motor control, dynamic balance, gait training, and strengthening exercises to improve functional status.    Personal Factors and Comorbidities Comorbidity 1    Comorbidities cryptogenic nature of stroke, pregnancy    Examination-Activity Limitations Bathing;Bend;Caring for Others;Carry;Hygiene/Grooming;Locomotion Level;Reach Overhead;Squat;Stairs;Stand;Toileting;Transfers    Examination-Participation Restrictions Cleaning;Community Activity;Driving;Laundry;Meal Prep;Occupation;Yard Work;Shop    Stability/Clinical Decision Making Evolving/Moderate complexity     Clinical Decision Making Moderate    Rehab Potential Good    PT Frequency 2x / week    PT Duration 8 weeks    PT Treatment/Interventions ADLs/Self Care Home Management;Aquatic Therapy;Biofeedback;Cryotherapy;Moist Heat;Traction;Balance training;Therapeutic exercise;Therapeutic activities;Functional mobility training;Stair training;Gait training;DME Instruction;Neuromuscular re-education;Patient/family education;Orthotic Fit/Training;Manual techniques;Vestibular;Taping;Splinting;Energy conservation;Passive range of motion;Joint Manipulations    PT Next Visit Plan Continue with gait training using least restrictive AD, gross motor coordination RLE, tone integration techniques, gait cycle phases    PT Home Exercise Plan Gait training with cane, Sit to stands with emphasis on LE symmetry and eccentric lowering           Patient will benefit from skilled therapeutic intervention in order to improve the following deficits and impairments:  Abnormal gait,Decreased activity tolerance,Decreased balance,Decreased coordination,Decreased mobility,Decreased knowledge of use of DME,Decreased knowledge of precautions,Decreased endurance,Decreased strength,Difficulty walking,Impaired perceived functional ability,Impaired tone,Impaired UE functional use,Improper body mechanics  Visit Diagnosis: Muscle weakness (generalized)  Difficulty in walking, not elsewhere classified  Other abnormalities of gait and mobility     Problem List Patient Active Problem List   Diagnosis Date Noted  . Previous cesarean section 01/11/2019  . S/P cesarean section 01/11/2019  .  Gestational diabetes 09/12/2016    3:57 PM, 11/21/20 Tereasa Coop, DPT Physical Therapy with [facility]  [phone] office  [facility] [address] [place], [place], [address] Phone: [phone]   Fax:  [phone]  Name: Denyla Cortese MRN: [id number] Date  of Birth: 06-03-1986

## 2020-11-21 NOTE — Patient Instructions (Signed)
Home Exercises Program Theraputty Exercises  Do the following exercises 1-2 times a day using your affected hand.   PUTTY PAD PINCH  Roll up some putty to create a small tubular section. Next, pinch the putty with the pad of your fingers and repeat down the section. 3-5 minutes      PUTTY KEY GRIP  Hold the putty at the top of your hand. Squeeze the putty between your thumb and the side of your 2nd finger as shown. 3-5 minutes    putty squeeze  Pt. should squeeze putty in hand trying to keep it round by rotating putty after each squeeze. push fingers through putty to palm each time. 3-5 minutes    `Both arms straight out in front of you, keep your elbows straight do not let them bend, the criss/cross your arms in front of you. 10 times. Both arms straight out in front of you, keep elbows straight.  Bend your wrist back and make small circles in the air.  Go in one direction the go in the other (Like you are waxing something) 10 times. Both arms straight out in front of you, keep elbows straight.  Bend your wrists back and move your arm up while the left arm goes down as quick as you can. 10 times. With your right/left arm straight out in front of you, keep your elbow straight.  Write your name in the air 3-4 times quickly. Put both of your hands on your lap with your palms down, turn your palms up and down as fast as you can. 10 times. Put both of your hands on your lap keeping your wrists on your lap at all times.  Now pat on your lap quickly alternating right/left. 10 times. Put your hands on your lap and drum your fingers.  Make sure your fingers are moving individually as fast as you can for 1 minute.

## 2020-11-22 NOTE — Therapy (Signed)
[facility] [address] [place], [place], [address] Phone: [phone]   Fax:  [phone]  Occupational Therapy Evaluation  Patient Details  Name: Kelly [name] MRN: [id number] Date of Birth: 17-Apr-1986 Referring Provider (OT): Delynn Flavin, DO   Encounter Date: 11/21/2020   OT End of Session - 11/22/20 1143    Visit Number 1    Number of Visits 12    Date for OT Re-Evaluation 01/16/21   mini reassess:12/19/20   Authorization Type [employer]    Authorization Time Period 12/03/19-12/01/20 No copay No auth required. No visit limit    OT Start Time 1600    OT Stop Time 1648    OT Time Calculation (min) 48 min    Activity Tolerance Patient tolerated treatment well    Behavior During Therapy WFL for tasks assessed/performed           Past Medical History:  Diagnosis Date  . Headache   . Missed abortion 05/2015   no surgery required  . PONV (postoperative nausea and vomiting)     Past Surgical History:  Procedure Laterality Date  . CESAREAN SECTION N/A 09/12/2016   Procedure: CESAREAN SECTION;  Surgeon: Zelphia Cairo, MD;  [facility];  Service: Obstetrics;  Laterality: N/A;  RNFA  . CESAREAN SECTION N/A 01/11/2019   Procedure: CESAREAN SECTION;  Surgeon: Zelphia Cairo, MD;  [facility]: [facility];  Service: Obstetrics;  Laterality: N/A;  Repeat edc 01/15/19 NKDA Kelly [name] RNFA  . DILATION AND EVACUATION N/A 12/23/2017   Procedure: DILATATION AND EVACUATION with Intraoperative Ultrasound ;  Surgeon: Zelphia Cairo, MD;  Location: [facility];  Service: Gynecology;  Laterality: N/A;  . WISDOM TOOTH EXTRACTION      There were no vitals filed for this visit.   Subjective Assessment - 11/21/20 1605    Subjective  S: I want to write better.    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 Dr. Nadine [name] has referred patient to occupational therapy for evaluation and treatment.    Patient Stated Goals To increase overall functional use of RUE and return to using it as her dominant extremity.    Currently in Pain? No/denies             [facility] OT Assessment - 11/21/20 1607      Assessment   Medical Diagnosis CVA right side weakness    Referring Provider (OT) Delynn Flavin, DO    Onset Date/Surgical Date 11/06/20    Hand Dominance Right    Next MD Visit TBD    Prior Therapy Evaluated in acute care only.      Precautions   Precautions Other (comment)    Precaution Comments [redacted] weeks pregnant at time of evaluation.      Restrictions   Weight Bearing Restrictions No      Balance Screen   Has the patient fallen in the past 6 months No      Home  Environment   Family/patient expects to be discharged to: Private residence    Living Arrangements Spouse/significant other   two small children: 21 y/o and almost 2 y/o.   Available  Help at Discharge Family   Mom will stop by and assist if husband is not there.     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 Has not attempted to change a diaper yet. Had difficulty recently trying to put shoes on son.      Mobility   Mobility Status Independent      Written Expression   Dominant Hand Right      Vision - History   Baseline Vision Wears contact    Additional Comments patient reports visual deficit. Right eye right upper quadrant visual loss. Only noticeable during distance versus near sighted tasks. Reports same in the left eye  although not as much as right eye.      Cognition   Overall Cognitive Status Within Functional Limits for tasks assessed      Observation/Other Assessments   Focus on Therapeutic Outcomes (FOTO)  N/A   DASH completed instead     Sensation   Light Touch Appears Intact    Stereognosis Appears Intact    Hot/Cold Appears Intact    Additional Comments Reports initially after stroke experiencing numbness on the entire right side of her body. Today, she states there is numbness in her RUE from the wrist to the fingers; both volar and dorsal aspect. Also reports numbness on right side of face.      Coordination   Gross Motor Movements are Fluid and Coordinated No    Fine Motor Movements are Fluid and Coordinated No    9 Hole Peg Test Right;Left    Right 9 Hole Peg Test 1'08"    Left 9 Hole Peg Test 17.2"      ROM / Strength   AROM / PROM / Strength AROM;Strength      AROM   Overall AROM  Within functional limits for tasks performed    Overall AROM Comments Slow motor movement initially then losing control during remainder of ranges. Demonstrated in the shoulder overall. 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      Strength   Overall Strength Comments Assessed seated. IR/er Abducted.    Strength Assessment Site Shoulder;Elbow;Forearm;Wrist;Hand    Right/Left Shoulder Right    Right Shoulder Flexion 4/5    Right Shoulder ABduction 4+/5    Right Shoulder Internal Rotation 4/5    Right Shoulder External Rotation 4-/5    Right Shoulder Horizontal ABduction 4+/5    Right Shoulder Horizontal ADduction 4+/5    Right/Left Elbow Right    Right Elbow Flexion 5/5    Right Elbow Extension 4/5    Right/Left Forearm Right    Right Forearm Pronation 5/5    Right Forearm Supination 5/5    Right/Left Wrist Right    Right Wrist Flexion 5/5    Right Wrist Extension 5/5    Right/Left hand Right;Left    Right Hand Grip (lbs) 25    Right Hand Lateral Pinch 7 lbs    Right Hand 3 Point  Pinch 6 lbs    Left Hand Grip (lbs) 75    Left Hand Lateral Pinch 18 lbs    Left Hand 3 Point Pinch 22 lbs               Quick Dash - 11/21/20 1627  Open a tight or new jar Unable    Do heavy household chores (wash walls, wash floors) Severe difficulty    Carry a shopping bag or briefcase Moderate difficulty    Wash your back Moderate difficulty    Use a knife to cut food Unable    Recreational activities in which you take some force or impact through your arm, shoulder, or hand (golf, hammering, tennis) No difficulty    During the past week, to what extent has your arm, shoulder or hand problem interfered with your normal social activities with family, friends, neighbors, or groups? Extremely    During the past week, to what extent has your arm, shoulder or hand problem limited your work or other regular daily activities Extremely    Arm, shoulder, or hand pain. None    Tingling (pins and needles) in your arm, shoulder, or hand Extreme    Difficulty Sleeping No difficulty    DASH Score 61.36 %                      OT Education - 11/22/20 1141    Education Details reviewed evaluation findings. Discussed goals for therapy. HEP: Proximal shoulder strengthening/coordination of RUE. Yellow theraputty for grip and pinch strengthening.    Person(s) Educated Patient;Parent(s)    Methods Explanation;Demonstration;Handout;Verbal cues    Comprehension Verbalized understanding;Returned demonstration            OT Short Term Goals - 11/22/20 1223      OT SHORT TERM GOAL #1   Title Patient will be educated and independent with HEP in order to faciliate her progress in therapy and allow her to return to using her right UE as her dominant for 75% or more of the time.    Time 4    Period Weeks    Status New    Target Date 12/19/20      OT SHORT TERM GOAL #2   Title Patient increase her right grip strength by 10# and pinch strength by 5# in order to manipulate and open  (not tight) jars or containers with less difficulty.    Time 4    Period Weeks    Status New      OT SHORT TERM GOAL #3   Title Patient will demonstrate an increase with right hand coordination by completing the 9 hole peg test in 50" or less allowing her to complet handwriting tasks with moderate difficulty and fatigue.    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    Time 8    Period Weeks    Status New    Target Date 01/16/21      OT LONG TERM GOAL #2   Title Patient will increase her right hand grip strength by 25# and pinch strength by 10# in order to change diapers and open containers/bottles with no difficulty.    Time 8    Period Weeks    Status New      OT LONG TERM GOAL #3   Title Patient will demonstrate an increase in coordination by completing the 9 hole peg test in 30" or less  in order to progress her handwriting legibility to as close to baseline as possible.    Time 8    Period Weeks    Status New      OT LONG TERM GOAL #4   Title Patient will demonstrate improved gross motor coordination while using her right hand as her dominant extremity during self feeding activities 100% of the time.    Time 8    Period Weeks    Status New                 Plan - 11/22/20 1210    Clinical Impression Statement A: Patient is a 34 y/o female S/P Left CVA with right side weakness,  decreased fine and gross motor coordination, decreased sensation, visual deficits, resulting in difficulty completing daily tasks, work related tasks, and childcare requirements.    OT Occupational Profile and History Detailed Assessment- Review of Records and additional review of physical, cognitive, psychosocial history related to current functional performance     Occupational performance deficits (Please refer to evaluation for details): ADL's;IADL's;Work;Leisure;Social Participation;Play    Body Structure / Function / Physical Skills ADL;Endurance;UE functional use;FMC;GMC;Coordination;Sensation;IADL;Strength    Rehab Potential Excellent    Clinical Decision Making Limited treatment options, no task modification necessary    Comorbidities Affecting Occupational Performance: None    Modification or Assistance to Complete Evaluation  No modification of tasks or assist necessary to complete eval    OT Frequency 2x / week    OT Duration 8 weeks    OT Treatment/Interventions Self-care/ADL training;Visual/perceptual remediation/compensation;Patient/family education;DME and/or AE instruction;Passive range of motion;Electrical Stimulation;Splinting;Paraffin;Therapeutic activities;Manual Therapy;Therapeutic exercise;Neuromuscular education    Plan P: patient will benefit from skilled OT services to increase functional performance during daily and childchild tasks while working towards returning to work. Treatment plan: Proximal shoulder strengthening, coordination, grip and pinch strength, sensory education regarding safety and re-training, motor control.    OT Home Exercise Plan Eval: yellow theraputty - grip and pinch strength. Proximal shoulder strengthening and coordination tasks    Consulted and Agree with Plan of Care Patient           Patient will benefit from skilled therapeutic intervention in order to improve the following deficits and impairments:   Body Structure / Function / Physical Skills: ADL,Endurance,UE functional use,FMC,GMC,Coordination,Sensation,IADL,Strength       Visit Diagnosis: Other symptoms and signs involving the musculoskeletal system - Plan: Ot plan of care cert/re-cert  Other lack of coordination - Plan: Ot plan of care cert/re-cert    Problem List Patient Active Problem List   Diagnosis Date Noted  . Previous cesarean  section 01/11/2019  . S/P cesarean section 01/11/2019  . Gestational diabetes 09/12/2016   Limmie PatriciaLaura Modestine Scherzinger, OTR/L,CBIS  [phone]  11/22/2020, 12:36 PM  [facility] [address] [place], [place], [address] Phone: [phone]   Fax:  [phone]  Name: Harless LittenJennifer Bailey Nunziato MRN: [id number] Date of Birth: 10/20/1986

## 2020-11-23 ENCOUNTER — Other Ambulatory Visit: Payer: Self-pay

## 2020-11-23 ENCOUNTER — Encounter (HOSPITAL_COMMUNITY): Payer: Self-pay | Admitting: Physical Therapy

## 2020-11-23 ENCOUNTER — Ambulatory Visit (HOSPITAL_COMMUNITY): Payer: BC Managed Care – PPO | Admitting: Occupational Therapy

## 2020-11-23 ENCOUNTER — Encounter (HOSPITAL_COMMUNITY): Payer: Self-pay | Admitting: Occupational Therapy

## 2020-11-23 ENCOUNTER — Ambulatory Visit (HOSPITAL_COMMUNITY): Payer: BC Managed Care – PPO | Admitting: Physical Therapy

## 2020-11-23 DIAGNOSIS — R2689 Other abnormalities of gait and mobility: Secondary | ICD-10-CM

## 2020-11-23 DIAGNOSIS — M6281 Muscle weakness (generalized): Secondary | ICD-10-CM | POA: Diagnosis not present

## 2020-11-23 DIAGNOSIS — R278 Other lack of coordination: Secondary | ICD-10-CM

## 2020-11-23 DIAGNOSIS — R262 Difficulty in walking, not elsewhere classified: Secondary | ICD-10-CM

## 2020-11-23 DIAGNOSIS — R29898 Other symptoms and signs involving the musculoskeletal system: Secondary | ICD-10-CM

## 2020-11-23 NOTE — Therapy (Signed)
[facility] [address] [place], [place], [address] Phone: [phone]   Fax:  [phone]  Physical Therapy Treatment  Patient Details  Name: Kelly [name] MRN: [id number] Date of Birth: 1986/06/18 Referring Provider (PT): Raliegh Ip, DO   Encounter Date: 11/23/2020   PT End of Session - 11/23/20 1443    Visit Number 2    Number of Visits 16    Date for PT Re-Evaluation 01/16/21    Authorization Type [employer], no auth required, no VL    Progress Note Due on Visit 10    PT Start Time 1433    PT Stop Time 1515    PT Time Calculation (min) 42 min    Equipment Utilized During Treatment --    Activity Tolerance Patient tolerated treatment well    Behavior During Therapy [facility] for tasks assessed/performed           Past Medical History:  Diagnosis Date  . Headache   . Missed abortion 05/2015   no surgery required  . PONV (postoperative nausea and vomiting)     Past Surgical History:  Procedure Laterality Date  . CESAREAN SECTION N/A 09/12/2016   Procedure: CESAREAN SECTION;  Surgeon: Zelphia Cairo, MD;  Location: [facility] BIRTHING SUITES;  Service: Obstetrics;  Laterality: N/A;  RNFA  . CESAREAN SECTION N/A 01/11/2019   Procedure: CESAREAN SECTION;  Surgeon: Zelphia Cairo, MD;  Location: [facility] BIRTHING SUITES;  Service: Obstetrics;  Laterality: N/A;  Repeat edc 01/15/19 NKDA [name] RNFA  . DILATION AND EVACUATION N/A 12/23/2017   Procedure: DILATATION AND EVACUATION with Intraoperative Ultrasound ;  Surgeon: Zelphia Cairo, MD;  Location: [facility];  Service: Gynecology;  Laterality: N/A;  . WISDOM TOOTH EXTRACTION      There were no vitals filed for this visit.   Subjective Assessment - 11/23/20 1443    Subjective Patient states she has been trying the home exercise. Is concern about the sit stands as this puts pressure on her stomach and makes her stomach feel tight.    Patient is accompained by:  Family member   mother   Currently in Pain? No/denies                             [facility] Adult PT Treatment/Exercise - 11/23/20 1638      Exercises   Exercises Lumbar;Knee/Hip      Lumbar Exercises: Seated   Other Seated Lumbar Exercises seated ab set 10 x 5"      Lumbar Exercises: Supine   Ab Set --   attempted, not tolerated due to positioning with pregnancy     Knee/Hip Exercises: Standing   Heel Raises 2 sets;10 reps    Hip Abduction Both;2 sets;10 reps    Forward Step Up 2 sets;10 reps;Hand Hold: 2;Step Height: 4"    Functional Squat 20 reps    Functional Squat Limitations UE assist on // bars      Knee/Hip Exercises: Seated   Sit to Sand 1 set;10 reps;without UE support                    PT Short Term Goals - 11/21/20 1546      PT SHORT TERM GOAL #1   Title Patient will increase RLE strength to 5/5 to promote ambulation without AD    Baseline 3+/5 to 4/5 RLE    Time 4    Period Weeks  Status New    Target Date 12/19/20      PT SHORT TERM GOAL #2   Title Patient will decrease risk for falls as evidenced by time of 13 seconds for 5 Times Sit to Stand test    Baseline 15.75 sec    Time 4    Period Weeks    Status New    Target Date 12/19/20      PT SHORT TERM GOAL #3   Title Patient will ambulate 500 ft without AD and normalized gait pattern during    Baseline 256 ft with CGA    Time 4    Period Weeks    Status New    Target Date 12/19/20             PT Long Term Goals - 11/21/20 1548      PT LONG TERM GOAL #1   Title Patient will be able to ambulate independently with normalized gait pattern x 600 ft during    Baseline 256 ft w/ CGA    Time 8    Period Weeks    Status New    Target Date 01/16/21      PT LONG TERM GOAL #2   Title Patient will demonstrate low risk for falls per score of 22/24 Dynamic Gait Index    Baseline DNT    Time 8    Period Weeks    Status New    Target Date 01/16/21      PT  LONG TERM GOAL #3   Title Patient will be independent in complex HEP to improve self-efficacy    Baseline in development    Time 8    Period Weeks    Status New    Target Date 01/16/21                 Plan - 11/23/20 1641    Clinical Impression Statement Reviewed therapy goals and HEP. Patient expressed concern with squatting during pregnancy, that this causes her stress for the safety of child. Discussed several alternatives that may be more comfortable/ less taxing to perform including sit to stand form elevated surface and mini squat from top range using UE assist. Also discussed co contraction of TA with pelvic floor muscles to stabilize core during activity. Attempted supine LE strength exercises but patient unable to tolerate position due to pregnancy. Initiated ther ex with focus on seated abdominal activation and standing LE strengthening exercise. Educated patient on proper form and function of all added exercise. Patient required verbal cues for RLE positioning during hip abduction and mini squats. Patient will continue to benefit from skilled therapy services to progress hip and core strength for improved functional mobility and ability to perform ADLs.    Personal Factors and Comorbidities Comorbidity 1    Comorbidities cryptogenic nature of stroke, pregnancy    Examination-Activity Limitations Bathing;Bend;Caring for Others;Carry;Hygiene/Grooming;Locomotion Level;Reach Overhead;Squat;Stairs;Stand;Toileting;Transfers    Examination-Participation Restrictions Cleaning;Community Activity;Driving;Laundry;Meal Prep;Occupation;Yard Work;Shop    Stability/Clinical Decision Making Evolving/Moderate complexity    Rehab Potential Good    PT Frequency 2x / week    PT Duration 8 weeks    PT Treatment/Interventions ADLs/Self Care Home Management;Aquatic Therapy;Biofeedback;Cryotherapy;Moist Heat;Traction;Balance training;Therapeutic exercise;Therapeutic activities;Functional mobility  training;Stair training;Gait training;DME Instruction;Neuromuscular re-education;Patient/family education;Orthotic Fit/Training;Manual techniques;Vestibular;Taping;Splinting;Energy conservation;Passive range of motion;Joint Manipulations    PT Next Visit Plan Continue with gait training using least restrictive AD, gross motor coordination RLE, tone integration techniques, gait cycle phases    PT Home Exercise Plan Gait training  with cane, Sit to stands with emphasis on LE symmetry and eccentric lowering    Consulted and Agree with Plan of Care Patient           Patient will benefit from skilled therapeutic intervention in order to improve the following deficits and impairments:  Abnormal gait,Decreased activity tolerance,Decreased balance,Decreased coordination,Decreased mobility,Decreased knowledge of use of DME,Decreased knowledge of precautions,Decreased endurance,Decreased strength,Difficulty walking,Impaired perceived functional ability,Impaired tone,Impaired UE functional use,Improper body mechanics  Visit Diagnosis: Muscle weakness (generalized)  Difficulty in walking, not elsewhere classified  Other abnormalities of gait and mobility     Problem List Patient Active Problem List   Diagnosis Date Noted  . Previous cesarean section 01/11/2019  . S/P cesarean section 01/11/2019  . Gestational diabetes 09/12/2016    5:03 PM, 11/23/20 Georges Lynch PT DPT  Physical Therapist with [facility]  [facility]  [phone]   [facility] [address] [place], [place], [address] Phone: [phone]   Fax:  [phone]  Name: Kelly [name] MRN: [id number] Date of Birth: 1985-12-06

## 2020-11-23 NOTE — Therapy (Signed)
[facility] [address] [place], [place], [address] Phone: [phone]   Fax:  [phone]  Occupational Therapy Treatment  Patient Details  Name: Kelly [name] MRN: [id number] Date of Birth: 1986/06/02 Referring Provider (OT): Delynn Flavin, DO   Encounter Date: 11/23/2020   OT End of Session - 11/23/20 1427    Visit Number 2    Number of Visits 12    Date for OT Re-Evaluation 01/16/21   mini reassess:12/19/20   Authorization Type [employer]    Authorization Time Period 12/03/19-12/01/20 No copay No auth required. No visit limit    OT Start Time 1347    OT Stop Time 1428    OT Time Calculation (min) 41 min    Activity Tolerance Patient tolerated treatment well    Behavior During Therapy WFL for tasks assessed/performed           Past Medical History:  Diagnosis Date  . Headache   . Missed abortion 05/2015   no surgery required  . PONV (postoperative nausea and vomiting)     Past Surgical History:  Procedure Laterality Date  . CESAREAN SECTION N/A 09/12/2016   Procedure: CESAREAN SECTION;  Surgeon: Zelphia Cairo, MD;  Location: [facility] BIRTHING SUITES;  Service: Obstetrics;  Laterality: N/A;  RNFA  . CESAREAN SECTION N/A 01/11/2019   Procedure: CESAREAN SECTION;  Surgeon: Zelphia Cairo, MD;  Location: [facility] BIRTHING SUITES;  Service: Obstetrics;  Laterality: N/A;  Repeat edc 01/15/19 NKDA [name] RNFA  . DILATION AND EVACUATION N/A 12/23/2017   Procedure: DILATATION AND EVACUATION with Intraoperative Ultrasound ;  Surgeon: Zelphia Cairo, MD;  Location: [facility];  Service: Gynecology;  Laterality: N/A;  . WISDOM TOOTH EXTRACTION      There were no vitals filed for this visit.   Subjective Assessment - 11/23/20 1346    Subjective  S: I laugh at the alternating hands exercise.    Currently in Pain? No/denies              [facility] OT Assessment - 11/23/20 1346      Assessment   Medical Diagnosis CVA right side  weakness      Precautions   Precautions Other (comment)    Precaution Comments [redacted] weeks pregnant at time of evaluation.                    OT Treatments/Exercises (OP) - 11/23/20 1353      Exercises   Exercises Hand      Hand Exercises   Hand Gripper with Large Beads all beads gripper at 20#, vertical    Hand Gripper with Medium Beads all beads gripper at 25#, vertical    Hand Gripper with Small Beads all beads gripper at 25#, horizontal    Sponges Pt using red clothespin and 3 point pinch to grasp sponges and stack into 4 piles of 5. Pt then using lateral pinch to grasp and place all 20 sponges back into the bucket.      Neurological Re-education Exercises   Other Exercises 1 proximal shoulder strengthening, 10X each, no rest breaks    Other Exercises 2 therapy ball strengthening: green ball, chest press, flexion, circles each direction, 10X each; ABC writing holding 1# weight    Reciprocal Movements UBE Level 1 3' forward 3' reverse, pace: 5.0      Fine Motor Coordination (Hand/Wrist)   Fine Motor Coordination Picking up coins    Picking up coins Pt picking up coins off of  the tabletop 3 at a time and placing into slotted container. Ataxia present initially, coordination and control improved with practice.                    OT Short Term Goals - 11/23/20 1417      OT SHORT TERM GOAL #1   Title Patient will be educated and independent with HEP in order to faciliate her progress in therapy and allow her to return to using her right UE as her dominant for 75% or more of the time.    Time 4    Period Weeks    Status On-going    Target Date 12/19/20      OT SHORT TERM GOAL #2   Title Patient increase her right grip strength by 10# and pinch strength by 5# in order to manipulate and open (not tight) jars or containers with less difficulty.    Time 4    Period Weeks    Status On-going      OT SHORT TERM GOAL #3   Title Patient will demonstrate an increase  with right hand coordination by completing the 9 hole peg test in 50" or less allowing her to complet handwriting tasks with moderate difficulty and fatigue.    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    Time 8    Period Weeks    Status On-going      OT LONG TERM GOAL #2   Title Patient will increase her right hand grip strength by 25# and pinch strength by 10# in order to change diapers and open containers/bottles with no difficulty.    Time 8    Period Weeks    Status On-going      OT LONG TERM GOAL #3   Title Patient will demonstrate an increase in coordination by completing the 9 hole peg test in 30" or less in order to progress her handwriting legibility to as close to baseline as possible.    Time 8    Period Weeks    Status On-going      OT LONG TERM GOAL #4   Title Patient will demonstrate improved gross motor coordination while using her right hand as her dominant extremity during self feeding activities 100% of the time.    Time 8    Period Weeks    Status On-going                 Plan - 11/23/20 1423    Clinical Impression Statement A: Pt reports tightness along forearm after completing HEP, educated on forearm stretch to complete after HEP. Initiated fine motor coordination tasks, grip and pinch strengthening, and proximal shoulder strengthening activities. Pt notes during pinch tasks that lateral pinch is more difficult than 3 point pinch. Pt with improved control with practice during tasks. Rest breaks provided as needed throughout session. Verbal cuing for form and technique.    Body Structure / Function / Physical Skills ADL;Endurance;UE functional use;FMC;GMC;Coordination;Sensation;IADL;Strength    Plan P: Continue with proximal  shoulder strengthening adding scapular theraband, increase gripper resistance for grip strengthening    OT Home Exercise Plan  Eval: yellow theraputty - grip and pinch strength. Proximal shoulder strengthening and coordination tasks    Consulted and Agree with Plan of Care Patient           Patient will benefit from skilled therapeutic intervention in order to improve the following deficits and impairments:   Body Structure / Function / Physical Skills: ADL,Endurance,UE functional use,FMC,GMC,Coordination,Sensation,IADL,Strength       Visit Diagnosis: Other symptoms and signs involving the musculoskeletal system  Other lack of coordination    Problem List Patient Active Problem List   Diagnosis Date Noted  . Previous cesarean section 01/11/2019  . S/P cesarean section 01/11/2019  . Gestational diabetes 09/12/2016   Ezra Sites, OTR/L  [phone] 11/23/2020, 2:28 PM  [facility] [address] [place], [place], [address] Phone: [phone]   Fax:  [phone]  Name: Davine Sweney MRN: [id number] Date of Birth: [birth year]

## 2020-11-28 ENCOUNTER — Ambulatory Visit (HOSPITAL_COMMUNITY): Payer: BC Managed Care – PPO | Admitting: Occupational Therapy

## 2020-11-28 ENCOUNTER — Encounter (HOSPITAL_COMMUNITY): Payer: Self-pay | Admitting: Occupational Therapy

## 2020-11-28 ENCOUNTER — Ambulatory Visit (HOSPITAL_COMMUNITY): Payer: BC Managed Care – PPO

## 2020-11-28 ENCOUNTER — Other Ambulatory Visit: Payer: Self-pay

## 2020-11-28 ENCOUNTER — Encounter (HOSPITAL_COMMUNITY): Payer: Self-pay

## 2020-11-28 DIAGNOSIS — R29898 Other symptoms and signs involving the musculoskeletal system: Secondary | ICD-10-CM

## 2020-11-28 DIAGNOSIS — R2689 Other abnormalities of gait and mobility: Secondary | ICD-10-CM

## 2020-11-28 DIAGNOSIS — M6281 Muscle weakness (generalized): Secondary | ICD-10-CM | POA: Diagnosis not present

## 2020-11-28 DIAGNOSIS — R278 Other lack of coordination: Secondary | ICD-10-CM

## 2020-11-28 DIAGNOSIS — R262 Difficulty in walking, not elsewhere classified: Secondary | ICD-10-CM

## 2020-11-28 NOTE — Patient Instructions (Signed)
Access Code: [email or web address] URL: [email or web address] Date: 11/28/2020 Prepared by: Shary Decamp  Exercises Backwards Walking - 1 x daily - 7 x weekly Sideways Walking - 1 x daily - 7 x weekly Standing Foot Lift on Box (BKA) - 1 x daily - 7 x weekly - 3 sets - 10 reps Runner's Step Up/Down - 1 x daily - 7 x weekly - 3 sets - 10 reps

## 2020-11-28 NOTE — Therapy (Signed)
[facility] [address] [place], [place], [address] Phone: [phone]   Fax:  [phone]  Occupational Therapy Treatment  Patient Details  Name: Kelly [name] MRN: [id number] Date of Birth: 1986-01-16 Referring Provider (OT): Delynn Flavin, DO   Encounter Date: 11/28/2020   OT End of Session - 11/28/20 1555    Visit Number 3    Number of Visits 12    Date for OT Re-Evaluation 01/16/21   mini reassess:12/19/20   Authorization Type [employer]    Authorization Time Period 12/03/19-12/01/20 No copay No auth required. No visit limit    OT Start Time 1519    OT Stop Time 1559    OT Time Calculation (min) 40 min    Activity Tolerance Patient tolerated treatment well    Behavior During Therapy WFL for tasks assessed/performed           Past Medical History:  Diagnosis Date  . Headache   . Missed abortion 05/2015   no surgery required  . PONV (postoperative nausea and vomiting)     Past Surgical History:  Procedure Laterality Date  . CESAREAN SECTION N/A 09/12/2016   Procedure: CESAREAN SECTION;  Surgeon: Zelphia Cairo, MD;  Location: [facility] BIRTHING SUITES;  Service: Obstetrics;  Laterality: N/A;  RNFA  . CESAREAN SECTION N/A 01/11/2019   Procedure: CESAREAN SECTION;  Surgeon: Zelphia Cairo, MD;  Location: [facility] BIRTHING SUITES;  Service: Obstetrics;  Laterality: N/A;  Repeat edc 01/15/19 NKDA [name] RNFA  . DILATION AND EVACUATION N/A 12/23/2017   Procedure: DILATATION AND EVACUATION with Intraoperative Ultrasound ;  Surgeon: Zelphia Cairo, MD;  Location: [facility];  Service: Gynecology;  Laterality: N/A;  . WISDOM TOOTH EXTRACTION      There were no vitals filed for this visit.   Subjective Assessment - 11/28/20 1519    Subjective  S: I've been outside a lot today.   Currently in Pain? No/denies              [facility] OT Assessment - 11/28/20 1519      Assessment   Medical Diagnosis CVA right side weakness       Precautions   Precautions Other (comment)    Precaution Comments [redacted] weeks pregnant at time of evaluation.                    OT Treatments/Exercises (OP) - 11/28/20 1523      Exercises   Exercises Hand;Theraputty      Hand Exercises   Hand Gripper with Large Beads all beads gripper at 25#, vertical    Hand Gripper with Medium Beads all beads gripper at 25#, vertical    Hand Gripper with Small Beads all beads gripper at 25#, horizontal      Theraputty   Theraputty - Grip red pronated (RUE unable to supinate)    Theraputty - Pinch red-lateral and 3 point pinch      Neurological Re-education Exercises   Other Exercises 1 scapular theraband: extension, row, retraction, 10X each red band; ball on wall, 1' flexion with green weighted ball    Other Exercises 2 Therapy ball strengthening: green ball, chest press, flexion, circles each direction, 10X each; ABC writing holding 1# weight      Fine Motor Coordination (Hand/Wrist)   Fine Motor Coordination Small Pegboard;Dealing card with thumb;Flipping cards    Small Pegboard Pt holding pegs in right hand, working on in-hand manipulation to bring to fingertips and place into pegboard. Mod difficulty  maintaining hold on pegs in palm, increased time to complete task.    Flipping cards Pt dealing card with left hand, grabbing with right 2 point pinch and flipping over to stack. Min difficulty                    OT Short Term Goals - 11/23/20 1417      OT SHORT TERM GOAL #1   Title Patient will be educated and independent with HEP in order to faciliate her progress in therapy and allow her to return to using her right UE as her dominant for 75% or more of the time.    Time 4    Period Weeks    Status On-going    Target Date 12/19/20      OT SHORT TERM GOAL #2   Title Patient increase her right grip strength by 10# and pinch strength by 5# in order to manipulate and open (not tight) jars or containers with less difficulty.     Time 4    Period Weeks    Status On-going      OT SHORT TERM GOAL #3   Title Patient will demonstrate an increase with right hand coordination by completing the 9 hole peg test in 50" or less allowing her to complet handwriting tasks with moderate difficulty and fatigue.    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    Time 8    Period Weeks    Status On-going      OT LONG TERM GOAL #2   Title Patient will increase her right hand grip strength by 25# and pinch strength by 10# in order to change diapers and open containers/bottles with no difficulty.    Time 8    Period Weeks    Status On-going      OT LONG TERM GOAL #3   Title Patient will demonstrate an increase in coordination by completing the 9 hole peg test in 30" or less in order to progress her handwriting legibility to as close to baseline as possible.    Time 8    Period Weeks    Status On-going      OT LONG TERM GOAL #4   Title Patient will demonstrate improved gross motor coordination while using her right hand as her dominant extremity during self feeding activities 100% of the time.    Time 8    Period Weeks    Status On-going                 Plan - 11/28/20 1526    Clinical Impression Statement A: Pt reports mild tingling in her lip and pinky finger after being outside with her kids today, improved after going back in and resting. Pt's BP 10/07/74 in clinic, encouraged pt to monitor if/when symptoms occur and note if she has been active and is fatigued, if symptoms do not improve or are happening frequently to seek MD advice or visit ER. Continued working on fine motor coordination using small pegboard this session. Added card deal and card flipping tasks. Continued with shoulder  strengthening and added scapular theraband and ball  on wall, pt dropping ball 2x during ball on wall task. Verbal cuing for form and technique.    Body Structure / Function / Physical Skills ADL;Endurance;UE functional use;FMC;GMC;Coordination;Sensation;IADL;Strength    Plan P: continue with proximal shoulder strengthening, attempt grooved pegboard activity    OT Home Exercise Plan Eval: yellow theraputty - grip and pinch strength. Proximal shoulder strengthening and coordination tasks; 12/28: updated to red theraputty    Consulted and Agree with Plan of Care Patient           Patient will benefit from skilled therapeutic intervention in order to improve the following deficits and impairments:   Body Structure / Function / Physical Skills: ADL,Endurance,UE functional use,FMC,GMC,Coordination,Sensation,IADL,Strength       Visit Diagnosis: Other symptoms and signs involving the musculoskeletal system  Other lack of coordination    Problem List Patient Active Problem List   Diagnosis Date Noted  . Previous cesarean section 01/11/2019  . S/P cesarean section 01/11/2019  . Gestational diabetes 09/12/2016   Ezra Sites, OTR/L  [phone] 11/28/2020, 4:00 PM   [facility] [address] [place], [place], [address] Phone: [phone]   Fax:  [phone]  Name: Kelly [name] MRN: [id number] Date of Birth: 03/04/1986

## 2020-11-28 NOTE — Therapy (Signed)
[facility] [address] [place], [place], [address] Phone: [phone]   Fax:  [phone]  Physical Therapy Treatment  Patient Details  Name: Kelly [name] MRN: [id number] Date of Birth: 18-Nov-1986 Referring Provider (PT): Raliegh Ip, DO   Encounter Date: 11/28/2020   PT End of Session - 11/28/20 1627    Visit Number 3    Number of Visits 16    Date for PT Re-Evaluation 01/16/21    Authorization Type [employer], no auth required, no VL    Progress Note Due on Visit 10    PT Start Time 1600    PT Stop Time 1645    PT Time Calculation (min) 45 min           Past Medical History:  Diagnosis Date   Headache    Missed abortion 05/2015   no surgery required   PONV (postoperative nausea and vomiting)     Past Surgical History:  Procedure Laterality Date   CESAREAN SECTION N/A 09/12/2016   Procedure: CESAREAN SECTION;  Surgeon: Zelphia Cairo, MD;  Location: [facility] BIRTHING SUITES;  Service: Obstetrics;  Laterality: N/A;  RNFA   CESAREAN SECTION N/A 01/11/2019   Procedure: CESAREAN SECTION;  Surgeon: Zelphia Cairo, MD;  Location: [facility] BIRTHING SUITES;  Service: Obstetrics;  Laterality: N/A;  Repeat edc 01/15/19 NKDA [name] RNFA   DILATION AND EVACUATION N/A 12/23/2017   Procedure: DILATATION AND EVACUATION with Intraoperative Ultrasound ;  Surgeon: Zelphia Cairo, MD;  Location: [facility];  Service: Gynecology;  Laterality: N/A;   WISDOM TOOTH EXTRACTION      There were no vitals filed for this visit.   Subjective Assessment - 11/28/20 1620    Subjective Patient reports that she is continuing to improve and reports that her walking abilities have improved and that she notices deterioration in gait quality with onset of fatigue. She has been walking without NBQC while in her home and focusing on step length symmetry    Patient is accompained by: --   mother   Currently in Pain? No/denies                              [facility] Adult PT Treatment/Exercise - 11/28/20 1649      Ambulation/Gait   Ambulation/Gait Yes    Ambulation/Gait Assistance 6: Modified independent (Device/Increase time)    Gait velocity - backwards backwards walking with CGA for 4x30 ft for hip extension    Pre-Gait Activities use of treadmill to facilitate RLE hip extension and terminal stance with treadmill providing propulsion and pt performing swing phase x 5 min    Gait Comments gait training on treadmill with visual broadcast on screen to facilitate symmetric step length with 12 cm discrepancy noted R<L      Neuro Re-ed    Neuro Re-ed Details  Standing in parallel bars using 6" step height with emphasis on rapid alternating pattern to improve gait speed 2x2 min bouts. Static standing with 6" step and performing single leg lift-offs to challenge single limb support 3x10 with 3 sec hold. Sidestepping with CGA to facilitate loading response RLE. 10 lbs and 5 lbs resisted LAQ with emphasis on increased velocity to promote knee extension at initial contact 2x20 reps. Runner's step up with BHR 2x10 reps to promote right hip extension/knee extension for stair negotiation.      Hand Exercises   Hand Gripper with Large Beads all  beads gripper at 25#, vertical    Hand Gripper with Medium Beads all beads gripper at 25#, vertical    Hand Gripper with Small Beads all beads gripper at 25#, horizontal                  PT Education - 11/28/20 1621    Education Details pt educated on HEP additions and means for safe home set-up to perform increasingly difficult balance/motor control maneuvers    Person(s) Educated Patient    Methods Explanation    Comprehension Verbalized understanding;Returned demonstration            PT Short Term Goals - 11/21/20 1546      PT SHORT TERM GOAL #1   Title Patient will increase RLE strength to 5/5 to promote ambulation without AD    Baseline 3+/5 to 4/5 RLE     Time 4    Period Weeks    Status New    Target Date 12/19/20      PT SHORT TERM GOAL #2   Title Patient will decrease risk for falls as evidenced by time of 13 seconds for 5 Times Sit to Stand test    Baseline 15.75 sec    Time 4    Period Weeks    Status New    Target Date 12/19/20      PT SHORT TERM GOAL #3   Title Patient will ambulate 500 ft without AD and normalized gait pattern during    Baseline 256 ft with CGA    Time 4    Period Weeks    Status New    Target Date 12/19/20             PT Long Term Goals - 11/21/20 1548      PT LONG TERM GOAL #1   Title Patient will be able to ambulate independently with normalized gait pattern x 600 ft during    Baseline 256 ft w/ CGA    Time 8    Period Weeks    Status New    Target Date 01/16/21      PT LONG TERM GOAL #2   Title Patient will demonstrate low risk for falls per score of 22/24 Dynamic Gait Index    Baseline DNT    Time 8    Period Weeks    Status New    Target Date 01/16/21      PT LONG TERM GOAL #3   Title Patient will be independent in complex HEP to improve self-efficacy    Baseline in development    Time 8    Period Weeks    Status New    Target Date 01/16/21                 Plan - 11/28/20 1658    Clinical Impression Statement Patient reports slow improvements noted in her RLE with ambulation and transfers and reports that her gait pattern deteriorates with onset of fatigue.  Patient reports that she has been ambulating in her house without NBQC and focusing on symmetric stride length.  Patient tolerating tx session with focus on motor control and increasing right hip extension and posturing at terminal stance to facilitate symmetric step length.  Continues to exhibit difficulty with rapid alternating movements with corresponding decomposition in fluidity.  Continued services needed to facilitate normalized gait pattern    Personal Factors and Comorbidities Comorbidity 1     Comorbidities cryptogenic nature of stroke, pregnancy  Examination-Activity Limitations Bathing;Bend;Caring for Others;Carry;Hygiene/Grooming;Locomotion Level;Reach Overhead;Squat;Stairs;Stand;Toileting;Transfers    Examination-Participation Restrictions Cleaning;Community Activity;Driving;Laundry;Meal Prep;Occupation;Yard Work;Shop    Stability/Clinical Decision Making Evolving/Moderate complexity    Rehab Potential Good    PT Frequency 2x / week    PT Duration 8 weeks    PT Treatment/Interventions ADLs/Self Care Home Management;Aquatic Therapy;Biofeedback;Cryotherapy;Moist Heat;Traction;Balance training;Therapeutic exercise;Therapeutic activities;Functional mobility training;Stair training;Gait training;DME Instruction;Neuromuscular re-education;Patient/family education;Orthotic Fit/Training;Manual techniques;Vestibular;Taping;Splinting;Energy conservation;Passive range of motion;Joint Manipulations    PT Next Visit Plan Continue with gait training using least restrictive AD, gross motor coordination RLE, tone integration techniques, gait cycle phases    PT Home Exercise Plan Gait training with cane, Sit to stands with emphasis on LE symmetry and eccentric lowering. 12/28: backwards walking, side stepping, runner's step up    Consulted and Agree with Plan of Care Patient           Patient will benefit from skilled therapeutic intervention in order to improve the following deficits and impairments:  Abnormal gait,Decreased activity tolerance,Decreased balance,Decreased coordination,Decreased mobility,Decreased knowledge of use of DME,Decreased knowledge of precautions,Decreased endurance,Decreased strength,Difficulty walking,Impaired perceived functional ability,Impaired tone,Impaired UE functional use,Improper body mechanics  Visit Diagnosis: Other symptoms and signs involving the musculoskeletal system  Other lack of coordination  Muscle weakness (generalized)  Difficulty in walking,  not elsewhere classified  Other abnormalities of gait and mobility     Problem List Patient Active Problem List   Diagnosis Date Noted   Previous cesarean section 01/11/2019   S/P cesarean section 01/11/2019   Gestational diabetes 09/12/2016    5:04 PM, 11/28/20 M. Shary Decamp, PT, DPT Physical Therapist- [place] Office Number: [phone]  [facility] [address] [place], [place], [address] Phone: [phone]   Fax:  [phone]  Name: Kelly [name] MRN: [id number] Date of Birth: 16-Dec-1985

## 2020-11-30 ENCOUNTER — Encounter (HOSPITAL_COMMUNITY): Payer: Self-pay | Admitting: Physical Therapy

## 2020-11-30 ENCOUNTER — Other Ambulatory Visit: Payer: Self-pay

## 2020-11-30 ENCOUNTER — Ambulatory Visit (HOSPITAL_COMMUNITY): Payer: BC Managed Care – PPO | Admitting: Physical Therapy

## 2020-11-30 ENCOUNTER — Encounter (HOSPITAL_COMMUNITY): Payer: Self-pay | Admitting: Specialist

## 2020-11-30 ENCOUNTER — Ambulatory Visit (HOSPITAL_COMMUNITY): Payer: BC Managed Care – PPO | Admitting: Specialist

## 2020-11-30 DIAGNOSIS — R2689 Other abnormalities of gait and mobility: Secondary | ICD-10-CM

## 2020-11-30 DIAGNOSIS — M6281 Muscle weakness (generalized): Secondary | ICD-10-CM

## 2020-11-30 DIAGNOSIS — R278 Other lack of coordination: Secondary | ICD-10-CM

## 2020-11-30 DIAGNOSIS — R29898 Other symptoms and signs involving the musculoskeletal system: Secondary | ICD-10-CM

## 2020-11-30 DIAGNOSIS — R262 Difficulty in walking, not elsewhere classified: Secondary | ICD-10-CM

## 2020-11-30 NOTE — Patient Instructions (Signed)
Handwriting: 1. Use tan foam cylinder on pen for easier manipulation of pen.  2. Practice writing name - draw 2 lines and try to write your name between the lines.  3. Draw curvy, wavy, choppy lines and try to trace them without lifting your arm.  4. Practice coloring or shading in small shapes.

## 2020-11-30 NOTE — Therapy (Signed)
[facility] [address] [place], [place], [address] Phone: [phone]   Fax:  [phone]  Physical Therapy Treatment  Patient Details  Name: Kelly [name] MRN: [id number] Date of Birth: 06-25-1986 Referring Provider (PT): Raliegh Ip, DO   Encounter Date: 11/30/2020   PT End of Session - 11/30/20 1308    Visit Number 4    Number of Visits 16    Date for PT Re-Evaluation 01/16/21    Authorization Type [employer], no auth required, no VL    Progress Note Due on Visit 10    PT Start Time 1302    PT Stop Time 1343    PT Time Calculation (min) 41 min    Equipment Utilized During Treatment Gait belt    Activity Tolerance Patient tolerated treatment well    Behavior During Therapy [facility] for tasks assessed/performed           Past Medical History:  Diagnosis Date   Headache    Missed abortion 05/2015   no surgery required   PONV (postoperative nausea and vomiting)     Past Surgical History:  Procedure Laterality Date   CESAREAN SECTION N/A 09/12/2016   Procedure: CESAREAN SECTION;  Surgeon: Zelphia Cairo, MD;  Location: [facility] BIRTHING SUITES;  Service: Obstetrics;  Laterality: N/A;  RNFA   CESAREAN SECTION N/A 01/11/2019   Procedure: CESAREAN SECTION;  Surgeon: Zelphia Cairo, MD;  Location: [facility] BIRTHING SUITES;  Service: Obstetrics;  Laterality: N/A;  Repeat edc 01/15/19 NKDA [name] RNFA   DILATION AND EVACUATION N/A 12/23/2017   Procedure: DILATATION AND EVACUATION with Intraoperative Ultrasound ;  Surgeon: Zelphia Cairo, MD;  Location: [facility];  Service: Gynecology;  Laterality: N/A;   WISDOM TOOTH EXTRACTION      There were no vitals filed for this visit.   Subjective Assessment - 11/30/20 1307    Subjective Patient reports no new issues. Says she has been trying to pay more attention to her stride length and take larger steps. She says added retro walking really stretched her out.     Patient is accompained by: --   mother   Currently in Pain? No/denies                             [facility] Adult PT Treatment/Exercise - 11/30/20 0001      Knee/Hip Exercises: Standing   Heel Raises 2 sets;10 reps    Lateral Step Up 2 sets;10 reps;Hand Hold: 1;Step Height: 6"    Forward Step Up 2 sets;10 reps;Hand Hold: 2;Step Height: 6"   1st set 2 HHA, 2nd set no HHA   Functional Squat 2 sets;10 reps    Gait Training 150 feet with no AD    Other Standing Knee Exercises tandem stance on solid floor 2 x 30" each    Other Standing Knee Exercises star drill with 4 cones on solid ground, intermittent HHA 3 RT, retro walking 2 RT                    PT Short Term Goals - 11/21/20 1546      PT SHORT TERM GOAL #1   Title Patient will increase RLE strength to 5/5 to promote ambulation without AD    Baseline 3+/5 to 4/5 RLE    Time 4    Period Weeks    Status New    Target Date 12/19/20  PT SHORT TERM GOAL #2   Title Patient will decrease risk for falls as evidenced by time of 13 seconds for 5 Times Sit to Stand test    Baseline 15.75 sec    Time 4    Period Weeks    Status New    Target Date 12/19/20      PT SHORT TERM GOAL #3   Title Patient will ambulate 500 ft without AD and normalized gait pattern during    Baseline 256 ft with CGA    Time 4    Period Weeks    Status New    Target Date 12/19/20             PT Long Term Goals - 11/21/20 1548      PT LONG TERM GOAL #1   Title Patient will be able to ambulate independently with normalized gait pattern x 600 ft during    Baseline 256 ft w/ CGA    Time 8    Period Weeks    Status New    Target Date 01/16/21      PT LONG TERM GOAL #2   Title Patient will demonstrate low risk for falls per score of 22/24 Dynamic Gait Index    Baseline DNT    Time 8    Period Weeks    Status New    Target Date 01/16/21      PT LONG TERM GOAL #3   Title Patient will be independent in  complex HEP to improve self-efficacy    Baseline in development    Time 8    Period Weeks    Status New    Target Date 01/16/21                 Plan - 11/30/20 1742    Clinical Impression Statement Patient tolerated ther ex progressions well today. Patient able to progress step height and added lateral box steps with no complaints. Patient did require HHA for lateral box step ups. Patient did well with added static tandem stance for balance activity. Patient showed good return with LE coordination during added star drill, but with increased RLE fatigue when in RLE stance. Patient will continue to benefit from skilled therapy services to progress LE strength and balance for return to PLOF.    Personal Factors and Comorbidities Comorbidity 1    Comorbidities cryptogenic nature of stroke, pregnancy    Examination-Activity Limitations Bathing;Bend;Caring for Others;Carry;Hygiene/Grooming;Locomotion Level;Reach Overhead;Squat;Stairs;Stand;Toileting;Transfers    Examination-Participation Restrictions Cleaning;Community Activity;Driving;Laundry;Meal Prep;Occupation;Yard Work;Shop    Stability/Clinical Decision Making Evolving/Moderate complexity    Rehab Potential Good    PT Frequency 2x / week    PT Duration 8 weeks    PT Treatment/Interventions ADLs/Self Care Home Management;Aquatic Therapy;Biofeedback;Cryotherapy;Moist Heat;Traction;Balance training;Therapeutic exercise;Therapeutic activities;Functional mobility training;Stair training;Gait training;DME Instruction;Neuromuscular re-education;Patient/family education;Orthotic Fit/Training;Manual techniques;Vestibular;Taping;Splinting;Energy conservation;Passive range of motion;Joint Manipulations    PT Next Visit Plan Continue with gait training using least restrictive AD, gross motor coordination RLE, tone integration techniques, gait cycle phases    PT Home Exercise Plan Gait training with cane, Sit to stands with emphasis on LE symmetry and  eccentric lowering. 12/28: backwards walking, side stepping, runner's step up    Consulted and Agree with Plan of Care Patient           Patient will benefit from skilled therapeutic intervention in order to improve the following deficits and impairments:  Abnormal gait,Decreased activity tolerance,Decreased balance,Decreased coordination,Decreased mobility,Decreased knowledge of use of DME,Decreased knowledge of precautions,Decreased  endurance,Decreased strength,Difficulty walking,Impaired perceived functional ability,Impaired tone,Impaired UE functional use,Improper body mechanics  Visit Diagnosis: Other symptoms and signs involving the musculoskeletal system  Other lack of coordination  Muscle weakness (generalized)  Difficulty in walking, not elsewhere classified  Other abnormalities of gait and mobility     Problem List Patient Active Problem List   Diagnosis Date Noted   Previous cesarean section 01/11/2019   S/P cesarean section 01/11/2019   Gestational diabetes 09/12/2016   5:43 PM, 11/30/20 Georges Lynch PT DPT  Physical Therapist with [place]  [facility]  [phone]   [facility] [address] [place], [place], [address] Phone: [phone]   Fax:  [phone]  Name: Kelly [name] MRN: [id number] Date of Birth: 10-28-1986

## 2020-12-01 ENCOUNTER — Encounter (HOSPITAL_COMMUNITY): Payer: Self-pay | Admitting: Specialist

## 2020-12-01 NOTE — Therapy (Signed)
[facility] [address] [place], [place], [address] Phone: [phone]   Fax:  [phone]  Occupational Therapy Treatment  Patient Details  Name: Kelly [name] MRN: [id number] Date of Birth: 1986-10-29 Referring Provider (OT): Delynn Flavin, DO   Encounter Date: 11/30/2020   OT End of Session - 12/01/20 0819    Visit Number 4    Number of Visits 12    Date for OT Re-Evaluation 01/16/21   mini reassess on 12/19/20   Authorization Type [employer]    Authorization Time Period 12/03/19-12/01/20 No copay No auth required. No visit limit    OT Start Time 1515    OT Stop Time 1600    OT Time Calculation (min) 45 min    Activity Tolerance Patient tolerated treatment well    Behavior During Therapy WFL for tasks assessed/performed           Past Medical History:  Diagnosis Date  . Headache   . Missed abortion 05/2015   no surgery required  . PONV (postoperative nausea and vomiting)     Past Surgical History:  Procedure Laterality Date  . CESAREAN SECTION N/A 09/12/2016   Procedure: CESAREAN SECTION;  Surgeon: Zelphia Cairo, MD;  Location: [facility] BIRTHING SUITES;  Service: Obstetrics;  Laterality: N/A;  RNFA  . CESAREAN SECTION N/A 01/11/2019   Procedure: CESAREAN SECTION;  Surgeon: Zelphia Cairo, MD;  Location: [facility] BIRTHING SUITES;  Service: Obstetrics;  Laterality: N/A;  Repeat edc 01/15/19 NKDA [name] RNFA  . DILATION AND EVACUATION N/A 12/23/2017   Procedure: DILATATION AND EVACUATION with Intraoperative Ultrasound ;  Surgeon: Zelphia Cairo, MD;  Location: [facility];  Service: Gynecology;  Laterality: N/A;  . WISDOM TOOTH EXTRACTION      There were no vitals filed for this visit.   Subjective Assessment - 12/01/20 0817    Subjective  S:  I can tell my arm is getting better.  It's not as wobbly as it used to be.  My handwriting is still not good.  I practice but I get frustrated.    Currently in Pain? No/denies               [facility] OT Assessment - 12/01/20 0826      Assessment   Medical Diagnosis CVA right side weakness    Referring Provider (OT) Delynn Flavin, DO      Precautions   Precautions Other (comment)    Precaution Comments [redacted] weeks pregnant at time of evaluation.                    OT Treatments/Exercises (OP) - 12/01/20 0001      Exercises   Exercises Hand;Theraputty      Theraputty   Theraputty - Flatten flatten standing then seated pvc pipe pull for improved wrist stability required for handwriting    Theraputty - Roll red seated    Theraputty - Grip red pronated grip    Theraputty - Pinch red 3 point and 2 point pinch    Theraputty Hand- Locate Pegs located 10 beads with min difficulty      Neurological Re-education Exercises   Other Exercises 1 proximal shoulder strengthening 2 sets of 10, writing name in air X 5    Other Exercises 2 therapy ball chest press, overhead press, flexion, overhead v with min vg to attempt to maintain right palm on ball      Fine Motor Coordination (Hand/Wrist)   Fine Motor Coordination Handwriting  Handwriting Patient reports increased accuracy with writing control with foam cylinder applied to pen.  Patient able to write her name and address with fair + legibility and 1" letters.  OT provided barrier lines that were 1/2" in height, and patient able to write name between these lines with good- legibility and control.  Finite wrist control not present during writing and tracing, however with shading in small 1/2 cube and club shape, patient demonstrated finite wrist movement and control.                  OT Education - 12/01/20 0818    Education Details educated patient on use of foam cylinder to build up pens and pencils for increased control with writing.  Educated patient to continue practice handwriting using 2 lines for borders for increased control, tracing wavy and jagged lines, coloring and shading in shapes and coloring  books.    Person(s) Educated Patient    Methods Explanation;Demonstration;Handout    Comprehension Verbalized understanding;Returned demonstration            OT Short Term Goals - 11/23/20 1417      OT SHORT TERM GOAL #1   Title Patient will be educated and independent with HEP in order to faciliate her progress in therapy and allow her to return to using her right UE as her dominant for 75% or more of the time.    Time 4    Period Weeks    Status On-going    Target Date 12/19/20      OT SHORT TERM GOAL #2   Title Patient increase her right grip strength by 10# and pinch strength by 5# in order to manipulate and open (not tight) jars or containers with less difficulty.    Time 4    Period Weeks    Status On-going      OT SHORT TERM GOAL #3   Title Patient will demonstrate an increase with right hand coordination by completing the 9 hole peg test in 50" or less allowing her to complet handwriting tasks with moderate difficulty and fatigue.    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    Time 8    Period Weeks    Status On-going      OT LONG TERM GOAL #2   Title Patient will increase her right hand grip strength by 25# and pinch strength by 10# in order to change diapers and open containers/bottles with no difficulty.    Time 8    Period Weeks    Status On-going      OT LONG TERM GOAL #3   Title Patient will demonstrate an increase in coordination by completing the 9 hole peg test in 30" or less in order to progress her handwriting legibility to as close to baseline as possible.    Time 8    Period Weeks    Status On-going      OT LONG TERM GOAL #4   Title Patient will demonstrate improved gross motor coordination while using her right hand as  her dominant extremity during self feeding activities 100% of the time.  Time 8    Period Weeks    Status On-going                 Plan - 12/01/20 0820    Clinical Impression Statement A:  treatment focused on grip strengthening and handwriting.  Patient reports increased accuracy with writing control with foam cylinder applied to pen.  Patient able to write her name and address with fair + legibility and 1" letters.  OT provided barrier lines that were 1/2" in height, and patient able to write name between these lines with good- legibility and control.  Finite wrist control not present during writing and tracing, however with shading in small 1/2 cube and club shape, patient demonstrated finite wrist movement and control.    Body Structure / Function / Physical Skills ADL;Endurance;UE functional use;FMC;GMC;Coordination;Sensation;IADL;Strength    Plan P:  Follow up on handwriting HEP.  Attempt handwriting on slant board for impoved control and prep for return to work.  attempt grooved peg board.           Patient will benefit from skilled therapeutic intervention in order to improve the following deficits and impairments:   Body Structure / Function / Physical Skills: ADL,Endurance,UE functional use,FMC,GMC,Coordination,Sensation,IADL,Strength       Visit Diagnosis: Other symptoms and signs involving the musculoskeletal system  Other lack of coordination    Problem List Patient Active Problem List   Diagnosis Date Noted  . Previous cesarean section 01/11/2019  . S/P cesarean section 01/11/2019  . Gestational diabetes 09/12/2016    Shirlean Mylar, MHA, OTR/L [phone]  12/01/2020, 8:30 AM  [facility] [address] [place], [place], [address] Phone: [phone]   Fax:  [phone]  Name: Kelly [name] MRN: [id number] Date of Birth: Mar 14, 1986

## 2020-12-04 ENCOUNTER — Encounter (HOSPITAL_COMMUNITY): Payer: BC Managed Care – PPO

## 2020-12-04 ENCOUNTER — Ambulatory Visit (HOSPITAL_COMMUNITY): Payer: BC Managed Care – PPO

## 2020-12-06 ENCOUNTER — Ambulatory Visit (HOSPITAL_COMMUNITY): Payer: BC Managed Care – PPO | Attending: Family Medicine | Admitting: Physical Therapy

## 2020-12-06 ENCOUNTER — Encounter (HOSPITAL_COMMUNITY): Payer: Self-pay

## 2020-12-06 ENCOUNTER — Ambulatory Visit (HOSPITAL_COMMUNITY): Payer: BC Managed Care – PPO

## 2020-12-06 ENCOUNTER — Encounter (HOSPITAL_COMMUNITY): Payer: Self-pay | Admitting: Physical Therapy

## 2020-12-06 ENCOUNTER — Other Ambulatory Visit: Payer: Self-pay

## 2020-12-06 DIAGNOSIS — R278 Other lack of coordination: Secondary | ICD-10-CM | POA: Diagnosis present

## 2020-12-06 DIAGNOSIS — R2689 Other abnormalities of gait and mobility: Secondary | ICD-10-CM | POA: Insufficient documentation

## 2020-12-06 DIAGNOSIS — R262 Difficulty in walking, not elsewhere classified: Secondary | ICD-10-CM | POA: Insufficient documentation

## 2020-12-06 DIAGNOSIS — R29898 Other symptoms and signs involving the musculoskeletal system: Secondary | ICD-10-CM

## 2020-12-06 DIAGNOSIS — M6281 Muscle weakness (generalized): Secondary | ICD-10-CM | POA: Diagnosis present

## 2020-12-06 NOTE — Therapy (Signed)
[facility] [address] [place], [place], [address] Phone: [phone]   Fax:  [phone]  Occupational Therapy Treatment  Patient Details  Name: Kelly [name] MRN: [id number] Date of Birth: [birth year] Referring Provider (OT): Delynn Flavin, DO   Encounter Date: 12/06/2020   OT End of Session - 12/06/20 1634    Visit Number 5    Number of Visits 12    Date for OT Re-Evaluation 01/16/21   mini reassess on 12/19/20   Authorization Type BSBC    Authorization Time Period [employer] 2022 benefits unknown at time of this appointment (12/06/20). Front desk will inform therapist when known.    OT Start Time 1437    OT Stop Time 1517    OT Time Calculation (min) 40 min    Activity Tolerance Patient tolerated treatment well    Behavior During Therapy WFL for tasks assessed/performed           Past Medical History:  Diagnosis Date  . Headache   . Missed abortion 05/2015   no surgery required  . PONV (postoperative nausea and vomiting)     Past Surgical History:  Procedure Laterality Date  . CESAREAN SECTION N/A 09/12/2016   Procedure: CESAREAN SECTION;  Surgeon: Zelphia Cairo, MD;  Location: [facility] BIRTHING SUITES;  Service: Obstetrics;  Laterality: N/A;  RNFA  . CESAREAN SECTION N/A 01/11/2019   Procedure: CESAREAN SECTION;  Surgeon: Zelphia Cairo, MD;  Location: [facility] BIRTHING SUITES;  Service: Obstetrics;  Laterality: N/A;  Repeat edc 01/15/19 NKDA [name] RNFA  . DILATION AND EVACUATION N/A 12/23/2017   Procedure: DILATATION AND EVACUATION with Intraoperative Ultrasound ;  Surgeon: Zelphia Cairo, MD;  Location: [facility];  Service: Gynecology;  Laterality: N/A;  . WISDOM TOOTH EXTRACTION      There were no vitals filed for this visit.   Subjective Assessment - 12/06/20 1623    Subjective  S: I'm getting better at getting shoes on my son. I haven't attempted to change a diaper yet still.    Currently in Pain? No/denies               [facility] OT Assessment - 12/06/20 1624      Assessment   Medical Diagnosis CVA right side weakness      Precautions   Precautions Other (comment)    Precaution Comments [redacted] weeks pregnant at time of evaluation.                    OT Treatments/Exercises (OP) - 12/06/20 1624      ADLs   Writing Focused on writin legibility while assessing smothness and fluidity and addressing decreased hand and wrist endurance and stability with and without adaptive devices. Writing sample obtained with paper on table top with use of pencil with built up foam. Next completed same writing task using Tip Grip on pencil. Writing complete on Slant Board with use of Tip Grip on pencil to provide support to wrist and encourage functional wrist extension needed. Directional coloring and circle coloring completed using pencil with Tip grip. Hole punch worksheet completed using golf tee in right hand; wrist and forearm remaining on table top to focus on isolated finger movement and wrist extension.      Exercises   Exercises Hand                  OT Education - 12/06/20 1630    Education Details Provided Tip Grip for use on regular pencil,  colored pencils, crayons, or pens versus use of built up foam piece to provide better support of IP joints of hand and control when completing isolated finger and wrist movements. Provided small pencil weight for personal use to provide proprioceptive input and help decrease the amount of pressure needed to paper due to decreased sensation. Worksheets provided: directional coloring and punch hole sheets. Printout of slant board and how to make own at home. print out for pencil weights from [employer].    Person(s) Educated Patient    Methods Explanation;Handout;Verbal cues;Demonstration    Comprehension Verbalized understanding;Returned demonstration            OT Short Term Goals - 11/23/20 1417      OT SHORT TERM GOAL #1   Title Patient will be  educated and independent with HEP in order to faciliate her progress in therapy and allow her to return to using her right UE as her dominant for 75% or more of the time.    Time 4    Period Weeks    Status On-going    Target Date 12/19/20      OT SHORT TERM GOAL #2   Title Patient increase her right grip strength by 10# and pinch strength by 5# in order to manipulate and open (not tight) jars or containers with less difficulty.    Time 4    Period Weeks    Status On-going      OT SHORT TERM GOAL #3   Title Patient will demonstrate an increase with right hand coordination by completing the 9 hole peg test in 50" or less allowing her to complet handwriting tasks with moderate difficulty and fatigue.    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    Time 8    Period Weeks    Status On-going      OT LONG TERM GOAL #2   Title Patient will increase her right hand grip strength by 25# and pinch strength by 10# in order to change diapers and open containers/bottles with no difficulty.    Time 8    Period Weeks    Status On-going      OT LONG TERM GOAL #3   Title Patient will demonstrate an increase in coordination by completing the 9 hole peg test in 30" or less in order to progress her handwriting legibility to as close to baseline as possible.    Time 8    Period Weeks    Status On-going      OT LONG TERM GOAL #4   Title Patient will demonstrate improved gross motor coordination while using her right hand as her dominant extremity during self feeding activities 100% of the time.    Time 8    Period Weeks    Status On-going                 Plan - 12/06/20 1636    Clinical Impression Statement A: Focused on writing skills using Tip grip (provided  for home use) with patient demonstrating less pressure on paper and increased fluidity and smoothness of motor movements when writing. Completed directional coloring and circle  coloring work with moderate difficulty noted demonstrating isolated wrist and finger movements. Increased fatigue due to muscle weakness as activity progressed. Rest breaks taken as needed. VC for form and technique were completed. Provided additional worksheets for HEP and education.    Body Structure / Function / Physical Skills ADL;Endurance;UE functional use;FMC;GMC;Coordination;Sensation;IADL;Strength    Plan P: Follow up on use of Tip Grip and worksheets provided last session. Complete activities that focus on either isolated wrist movements and/or isolated finger movements.    Consulted and Agree with Plan of Care Patient           Patient will benefit from skilled therapeutic intervention in order to improve the following deficits and impairments:   Body Structure / Function / Physical Skills: ADL,Endurance,UE functional use,FMC,GMC,Coordination,Sensation,IADL,Strength       Visit Diagnosis: Other lack of coordination  Other symptoms and signs involving the musculoskeletal system    Problem List Patient Active Problem List   Diagnosis Date Noted  . Previous cesarean section 01/11/2019  . S/P cesarean section 01/11/2019  . Gestational diabetes 09/12/2016    Limmie Patricia, OTR/L,CBIS  [phone]  12/06/2020, 4:46 PM  [facility] [address] [place], [place], [address] Phone: [phone]   Fax:  [phone]  Name: Seena Ritacco MRN: [id number] Date of Birth: Feb 23, 1986

## 2020-12-06 NOTE — Therapy (Signed)
[place], [place], [address] Phone: [phone]   Fax:  [phone]  Physical Therapy Treatment  Patient Details  Name: Kelly [name] MRN: [id number] Date of Birth: 03-Aug-1986 Referring Provider (PT): Janora Norlander, DO   Encounter Date: 12/06/2020   PT End of Session - 12/06/20 1401    Visit Number 5    Number of Visits 16    Date for PT Re-Evaluation 01/16/21    Authorization Type [employer], no auth required, no VL    Progress Note Due on Visit 10    PT Start Time 1401    PT Stop Time 1439    PT Time Calculation (min) 38 min    Equipment Utilized During Treatment Gait belt    Activity Tolerance Patient tolerated treatment well    Behavior During Therapy WFL for tasks assessed/performed           Past Medical History:  Diagnosis Date  . Headache   . Missed abortion 05/2015   no surgery required  . PONV (postoperative nausea and vomiting)     Past Surgical History:  Procedure Laterality Date  . CESAREAN SECTION N/A 09/12/2016   Procedure: CESAREAN SECTION;  Surgeon: Marylynn Pearson, MD;  Location: [place];  Service: Obstetrics;  Laterality: N/A;  RNFA  . CESAREAN SECTION N/A 01/11/2019   Procedure: CESAREAN SECTION;  Surgeon: Marylynn Pearson, MD;  Location: [place];  Service: Obstetrics;  Laterality: N/A;  Repeat edc 01/15/19 NKDA [name] RNFA  . DILATION AND EVACUATION N/A 12/23/2017   Procedure: DILATATION AND EVACUATION with Intraoperative Ultrasound ;  Surgeon: Marylynn Pearson, MD;  Location: [facility];  Service: Gynecology;  Laterality: N/A;  . WISDOM TOOTH EXTRACTION      There were no vitals filed for this visit.   Subjective Assessment - 12/06/20 1402    Subjective She has been working home exercises but they have been giving her cramps. She thinks the step up with knee drive is making her get cramps. She stopped doing it and the cramps got better.  She has trouble getting off the ground.    Currently in Pain? No/denies                             [facility] Adult PT Treatment/Exercise - 12/06/20 0001      Knee/Hip Exercises: Standing   Heel Raises 2 sets;10 reps    Lateral Step Up 2 sets;10 reps;Hand Hold: 1;Step Height: 6"    Lateral Step Up Limitations eccentric control    SLS with Vectors 5x 5 second holds bilateral    Other Standing Knee Exercises tandem stance on solid floor 2 x 30" each; tandem on foam 2x 30 seconds bilateral      Knee/Hip Exercises: Seated   Other Seated Knee/Hip Exercises transitions to and from floor with mat, cueing for sequencing and mechanics -10 minutes    Sit to Sand 1 set;10 reps;without UE support   with black foam under LLE                 PT Education - 12/06/20 1402    Education Details Patient educated on HEP, exercise mechanics    Person(s) Educated Patient    Methods Explanation;Demonstration    Comprehension Verbalized understanding;Returned demonstration            PT Short Term Goals - 11/21/20 1546  PT SHORT TERM GOAL #1   Title Patient will increase RLE strength to 5/5 to promote ambulation without AD    Baseline 3+/5 to 4/5 RLE    Time 4    Period Weeks    Status New    Target Date 12/19/20      PT SHORT TERM GOAL #2   Title Patient will decrease risk for falls as evidenced by time of 13 seconds for 5 Times Sit to Stand test    Baseline 15.75 sec    Time 4    Period Weeks    Status New    Target Date 12/19/20      PT SHORT TERM GOAL #3   Title Patient will ambulate 500 ft without AD and normalized gait pattern during    Baseline 256 ft with CGA    Time 4    Period Weeks    Status New    Target Date 12/19/20             PT Long Term Goals - 11/21/20 1548      PT LONG TERM GOAL #1   Title Patient will be able to ambulate independently with normalized gait pattern x 600 ft during    Baseline 256 ft w/ CGA    Time 8     Period Weeks    Status New    Target Date 01/16/21      PT LONG TERM GOAL #2   Title Patient will demonstrate low risk for falls per score of 22/24 Dynamic Gait Index    Baseline DNT    Time 8    Period Weeks    Status New    Target Date 01/16/21      PT LONG TERM GOAL #3   Title Patient will be independent in complex HEP to improve self-efficacy    Baseline in development    Time 8    Period Weeks    Status New    Target Date 01/16/21                 Plan - 12/06/20 1401    Clinical Impression Statement Patient instructed and able to perform transition to and from floor to standing with lowered mat table with good mechanics following. Patient states improved confidence in transfers following. Patient given cueing to avoid hyperextension with concentric phase of lateral step down which she continues to have difficulty with. She does show good eccentric motor control and strength but requires UE support for balance. Patient requires hands on thighs for STS with bias secondary to weakness. Patient with increased unsteadiness with RLE posterior with tandem on foam. Patient will continue to benefit from skilled physical therapy in order to reduce impairment and improve function.    Personal Factors and Comorbidities Comorbidity 1    Comorbidities cryptogenic nature of stroke, pregnancy    Examination-Activity Limitations Bathing;Bend;Caring for Others;Carry;Hygiene/Grooming;Locomotion Level;Reach Overhead;Squat;Stairs;Stand;Toileting;Transfers    Examination-Participation Restrictions Cleaning;Community Activity;Driving;Laundry;Meal Prep;Occupation;Yard Work;Shop    Stability/Clinical Decision Making Evolving/Moderate complexity    Rehab Potential Good    PT Frequency 2x / week    PT Duration 8 weeks    PT Treatment/Interventions ADLs/Self Care Home Management;Aquatic Therapy;Biofeedback;Cryotherapy;Moist Heat;Traction;Balance training;Therapeutic exercise;Therapeutic  activities;Functional mobility training;Stair training;Gait training;DME Instruction;Neuromuscular re-education;Patient/family education;Orthotic Fit/Training;Manual techniques;Vestibular;Taping;Splinting;Energy conservation;Passive range of motion;Joint Manipulations    PT Next Visit Plan Continue with gait training using least restrictive AD, gross motor coordination RLE, tone integration techniques, gait cycle phases    PT Home Exercise  Plan Gait training with cane, Sit to stands with emphasis on LE symmetry and eccentric lowering. 12/28: backwards walking, side stepping, runner's step up    Consulted and Agree with Plan of Care Patient           Patient will benefit from skilled therapeutic intervention in order to improve the following deficits and impairments:  Abnormal gait,Decreased activity tolerance,Decreased balance,Decreased coordination,Decreased mobility,Decreased knowledge of use of DME,Decreased knowledge of precautions,Decreased endurance,Decreased strength,Difficulty walking,Impaired perceived functional ability,Impaired tone,Impaired UE functional use,Improper body mechanics  Visit Diagnosis: Other symptoms and signs involving the musculoskeletal system  Other lack of coordination  Muscle weakness (generalized)  Difficulty in walking, not elsewhere classified  Other abnormalities of gait and mobility     Problem List Patient Active Problem List   Diagnosis Date Noted  . Previous cesarean section 01/11/2019  . S/P cesarean section 01/11/2019  . Gestational diabetes 09/12/2016    2:42 PM, 12/06/20 Wyman Songster PT, DPT Physical Therapist at [facility]   [facility] [address] [place], [place], [address] Phone: [phone]   Fax:  [phone]  Name: Kelly [name] MRN: [id number] Date of Birth: 03-30-1986

## 2020-12-07 ENCOUNTER — Telehealth: Payer: Self-pay

## 2020-12-07 NOTE — Telephone Encounter (Signed)
Yes please make sure that she is adequately hydrating.  This would be very essential, particularly in pregnancy.

## 2020-12-07 NOTE — Telephone Encounter (Signed)
Received a call from husband of patient. Patient is [redacted] weeks pregnant and had a stroke a month ago.  She had an appointment with her OB and states that her bp was 91/58 and the OB office was not worried about it.  States that since she has been home her bp has been 92/68.  No distress just some anxiety since her bp is lower than normal.  Advised husband to call and follow up with OB about his concerns.  Aware message will also be sent to PCP.

## 2020-12-08 ENCOUNTER — Ambulatory Visit (HOSPITAL_COMMUNITY): Payer: BC Managed Care – PPO

## 2020-12-08 ENCOUNTER — Ambulatory Visit (HOSPITAL_COMMUNITY): Payer: BC Managed Care – PPO | Admitting: Occupational Therapy

## 2020-12-08 ENCOUNTER — Telehealth (HOSPITAL_COMMUNITY): Payer: Self-pay

## 2020-12-08 ENCOUNTER — Telehealth (HOSPITAL_COMMUNITY): Payer: Self-pay | Admitting: Occupational Therapy

## 2020-12-08 NOTE — Telephone Encounter (Signed)
Left message to call back  

## 2020-12-08 NOTE — Telephone Encounter (Signed)
pt mom called to cx today's appt due to her daughter is not feeling well

## 2020-12-08 NOTE — Telephone Encounter (Signed)
pt mom called to cx today's appt due to her daughter is not feeling well 

## 2020-12-11 ENCOUNTER — Encounter (HOSPITAL_COMMUNITY): Payer: Self-pay

## 2020-12-11 ENCOUNTER — Ambulatory Visit (HOSPITAL_COMMUNITY): Payer: BC Managed Care – PPO

## 2020-12-11 ENCOUNTER — Other Ambulatory Visit: Payer: Self-pay

## 2020-12-11 DIAGNOSIS — R29898 Other symptoms and signs involving the musculoskeletal system: Secondary | ICD-10-CM

## 2020-12-11 DIAGNOSIS — R2689 Other abnormalities of gait and mobility: Secondary | ICD-10-CM

## 2020-12-11 DIAGNOSIS — R278 Other lack of coordination: Secondary | ICD-10-CM

## 2020-12-11 DIAGNOSIS — R262 Difficulty in walking, not elsewhere classified: Secondary | ICD-10-CM

## 2020-12-11 DIAGNOSIS — M6281 Muscle weakness (generalized): Secondary | ICD-10-CM

## 2020-12-11 NOTE — Therapy (Signed)
[place] [address] [place], [place], [address] Phone: [phone]   Fax:  [phone]  Physical Therapy Treatment  Patient Details  Name: Jeyli Zwicker MRN: [id number] Date of Birth: [birth year] Referring Provider (PT): Raliegh Ip, DO   Encounter Date: 12/11/2020   PT End of Session - 12/11/20 1315    Visit Number 6    Number of Visits 16    Date for PT Re-Evaluation 01/16/21    Authorization Type [employer], no auth required, no VL    Progress Note Due on Visit 10    PT Start Time 1300    PT Stop Time 1340    PT Time Calculation (min) 40 min    Equipment Utilized During Treatment Gait belt    Activity Tolerance Patient tolerated treatment well    Behavior During Therapy [facility] for tasks assessed/performed           Past Medical History:  Diagnosis Date  . Headache   . Missed abortion 05/2015   no surgery required  . PONV (postoperative nausea and vomiting)     Past Surgical History:  Procedure Laterality Date  . CESAREAN SECTION N/A 09/12/2016   Procedure: CESAREAN SECTION;  Surgeon: Zelphia Cairo, MD;  Location: [facility] BIRTHING SUITES;  Service: Obstetrics;  Laterality: N/A;  RNFA  . CESAREAN SECTION N/A 01/11/2019   Procedure: CESAREAN SECTION;  Surgeon: Zelphia Cairo, MD;  Location: [facility] BIRTHING SUITES;  Service: Obstetrics;  Laterality: N/A;  Repeat edc 01/15/19 NKDA [name] RNFA  . DILATION AND EVACUATION N/A 12/23/2017   Procedure: DILATATION AND EVACUATION with Intraoperative Ultrasound ;  Surgeon: Zelphia Cairo, MD;  Location: [facility];  Service: Gynecology;  Laterality: N/A;  . WISDOM TOOTH EXTRACTION      There were no vitals filed for this visit.   Subjective Assessment - 12/11/20 1314    Subjective Patient reports recent episode of low BP and dehydration requiring ER visit for fluids.  Denies any symptoms today of dizziness or lightheadedness today and feels that her RLE  coordination and strength is improving and reports more natural feeling gait pattern                             [facility] Adult PT Treatment/Exercise - 12/11/20 0001      Ambulation/Gait   Ambulation/Gait Yes    Ambulation/Gait Assistance 7: Independent    Ambulation Distance (Feet) 300 Feet    Assistive device None    Gait Pattern Right steppage;Ataxic    Ambulation Surface Level    Gait velocity - backwards backwards walking with CGA for 4x50 ft for hip extension    Gait Comments landing with foot flat RLE initial contact.  Kinesiotape applied to right tib. anterior for facilitation and demo improve heel strike contact with its use      Exercises   Exercises Ankle      Knee/Hip Exercises: Standing   SLS 4x10 sec   right hip drop noted   Other Standing Knee Exercises stair taps with 6" step and 5 lbs ankle weights x 2 min    Other Standing Knee Exercises sidestepping with 5 lbs ankle weights 2x2 min      Knee/Hip Exercises: Seated   Long Arc Quad Strengthening;Both;3 sets;10 reps   5 lbs, then 10 lbs 3x10 RLE     Ankle Exercises: Seated   Toe Raise --   3x10, then 2 min  rapidly alternating x 2 min to improve dorsiflexion endurance/coordination.                 PT Education - 12/11/20 1323    Education Details Patient educated on benefits of retrowalking and use of kinesiotape for neuro re-ed of right tib. anterior    Person(s) Educated Patient    Methods Explanation    Comprehension Verbalized understanding            PT Short Term Goals - 11/21/20 1546      PT SHORT TERM GOAL #1   Title Patient will increase RLE strength to 5/5 to promote ambulation without AD    Baseline 3+/5 to 4/5 RLE    Time 4    Period Weeks    Status New    Target Date 12/19/20      PT SHORT TERM GOAL #2   Title Patient will decrease risk for falls as evidenced by time of 13 seconds for 5 Times Sit to Stand test    Baseline 15.75 sec    Time 4    Period Weeks     Status New    Target Date 12/19/20      PT SHORT TERM GOAL #3   Title Patient will ambulate 500 ft without AD and normalized gait pattern during    Baseline 256 ft with CGA    Time 4    Period Weeks    Status New    Target Date 12/19/20             PT Long Term Goals - 11/21/20 1548      PT LONG TERM GOAL #1   Title Patient will be able to ambulate independently with normalized gait pattern x 600 ft during    Baseline 256 ft w/ CGA    Time 8    Period Weeks    Status New    Target Date 01/16/21      PT LONG TERM GOAL #2   Title Patient will demonstrate low risk for falls per score of 22/24 Dynamic Gait Index    Baseline DNT    Time 8    Period Weeks    Status New    Target Date 01/16/21      PT LONG TERM GOAL #3   Title Patient will be independent in complex HEP to improve self-efficacy    Baseline in development    Time 8    Period Weeks    Status New    Target Date 01/16/21                 Plan - 12/11/20 1324    Clinical Impression Statement Patient presents to therapy today ambulating without AD and demonstrates instances of right steppage gait with foot flat initial contact noted.  Coordination and gait kinematics improved with kinesiotape to right tibialias anterior with heel strike initial contact.  Continues to demo deficits in coordination especially with rapidly alternating movements impacting her gait and balance.  Continued services indicated to normalize pattern and improve strength/activity tolerance to reduce risk for falls    Personal Factors and Comorbidities Comorbidity 1    Comorbidities cryptogenic nature of stroke, pregnancy    Examination-Activity Limitations Bathing;Bend;Caring for Others;Carry;Hygiene/Grooming;Locomotion Level;Reach Overhead;Squat;Stairs;Stand;Toileting;Transfers    Examination-Participation Restrictions Cleaning;Community Activity;Driving;Laundry;Meal Prep;Occupation;Yard Work;Shop    Stability/Clinical  Decision Making Evolving/Moderate complexity    Rehab Potential Good    PT Frequency 2x / week    PT Duration 8  weeks    PT Treatment/Interventions ADLs/Self Care Home Management;Aquatic Therapy;Biofeedback;Cryotherapy;Moist Heat;Traction;Balance training;Therapeutic exercise;Therapeutic activities;Functional mobility training;Stair training;Gait training;DME Instruction;Neuromuscular re-education;Patient/family education;Orthotic Fit/Training;Manual techniques;Vestibular;Taping;Splinting;Energy conservation;Passive range of motion;Joint Manipulations    PT Next Visit Plan Continue with gait training using least restrictive AD, gross motor coordination RLE, tone integration techniques, gait cycle phases    PT Home Exercise Plan Gait training with cane, Sit to stands with emphasis on LE symmetry and eccentric lowering. 12/28: backwards walking, side stepping, runner's step up. 1/10 single leg balance at kitchen sink with left foot in base cabinet, ankle dorsiflexion    Consulted and Agree with Plan of Care Patient           Patient will benefit from skilled therapeutic intervention in order to improve the following deficits and impairments:  Abnormal gait,Decreased activity tolerance,Decreased balance,Decreased coordination,Decreased mobility,Decreased knowledge of use of DME,Decreased knowledge of precautions,Decreased endurance,Decreased strength,Difficulty walking,Impaired perceived functional ability,Impaired tone,Impaired UE functional use,Improper body mechanics  Visit Diagnosis: Other symptoms and signs involving the musculoskeletal system  Muscle weakness (generalized)  Difficulty in walking, not elsewhere classified  Other abnormalities of gait and mobility     Problem List Patient Active Problem List   Diagnosis Date Noted  . Previous cesarean section 01/11/2019  . S/P cesarean section 01/11/2019  . Gestational diabetes 09/12/2016    1:49 PM, 12/11/20 M. Shary Decamp, PT,  DPT Physical Therapist- [place] Office Number: [phone]  [facility] [address] [place], [place], [address] Phone: [phone]   Fax:  [phone]  Name: Deziah Renwick MRN: [id number] Date of Birth: 08/16/1986

## 2020-12-11 NOTE — Therapy (Signed)
[facility] [address] [place], [place], [address] Phone: [phone]   Fax:  [phone]  Occupational Therapy Treatment  Patient Details  Name: Kelly [name] MRN: [id number] Date of Birth: 05-27-1986 Referring Provider (OT): Delynn Flavin, DO   Encounter Date: 12/11/2020   OT End of Session - 12/11/20 1541    Visit Number 6    Number of Visits 12    Date for OT Re-Evaluation 01/16/21   mini reassess on 12/19/20   Authorization Type BSBC    Authorization Time Period BCBS 2022 benefits unknown at time of this appointment (12/06/20). Front desk will inform therapist when known.    OT Start Time 1348    OT Stop Time 1426    OT Time Calculation (min) 38 min    Activity Tolerance Patient tolerated treatment well    Behavior During Therapy WFL for tasks assessed/performed           Past Medical History:  Diagnosis Date  . Headache   . Missed abortion 05/2015   no surgery required  . PONV (postoperative nausea and vomiting)     Past Surgical History:  Procedure Laterality Date  . CESAREAN SECTION N/A 09/12/2016   Procedure: CESAREAN SECTION;  Surgeon: Zelphia Cairo, MD;  Location: [facility] BIRTHING SUITES;  Service: Obstetrics;  Laterality: N/A;  RNFA  . CESAREAN SECTION N/A 01/11/2019   Procedure: CESAREAN SECTION;  Surgeon: Zelphia Cairo, MD;  Location: [facility] BIRTHING SUITES;  Service: Obstetrics;  Laterality: N/A;  Repeat edc 01/15/19 NKDA [name] RNFA  . DILATION AND EVACUATION N/A 12/23/2017   Procedure: DILATATION AND EVACUATION with Intraoperative Ultrasound ;  Surgeon: Zelphia Cairo, MD;  Location: [facility] ORS;  Service: Gynecology;  Laterality: N/A;  . WISDOM TOOTH EXTRACTION      There were no vitals filed for this visit.   Subjective Assessment - 12/11/20 1416    Subjective  S: I was having issues with my BP on Friday and over the weekend I was so tired from it all that I didn't get to practice as much as I had  wanted to with the writing.    Currently in Pain? No/denies              [facility] OT Assessment - 12/11/20 1420      Assessment   Medical Diagnosis CVA right side weakness      Precautions   Precautions Other (comment)    Precaution Comments Pt is [redacted] weeks pregnant on 12/11/20                    OT Treatments/Exercises (OP) - 12/11/20 1421      Exercises   Exercises Hand      Hand Exercises   Sponges Using green clothespin in right hand with 3 point pinch, patient picked up 25 sponges and placed them in a container approximately arms distance away on table top.      Neurological Re-education Exercises   Other Exercises 2 Using green clothespin, patient stacked the sponges to form four towers of 5.      Fine Motor Coordination (Hand/Wrist)   Fine Motor Coordination Grooved pegs    Grooved pegs Using tweezers, patient focused on wrist extension while keeping forearm on table as much as possible while placing pegs in board. Moderate difficulty to place. Removed in same fashion with difficulty level decreased.  OT Short Term Goals - 11/23/20 1417      OT SHORT TERM GOAL #1   Title Patient will be educated and independent with HEP in order to faciliate her progress in therapy and allow her to return to using her right UE as her dominant for 75% or more of the time.    Time 4    Period Weeks    Status On-going    Target Date 12/19/20      OT SHORT TERM GOAL #2   Title Patient increase her right grip strength by 10# and pinch strength by 5# in order to manipulate and open (not tight) jars or containers with less difficulty.    Time 4    Period Weeks    Status On-going      OT SHORT TERM GOAL #3   Title Patient will demonstrate an increase with right hand coordination by completing the 9 hole peg test in 50" or less allowing her to complet handwriting tasks with moderate difficulty and fatigue.    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    Time 8    Period Weeks    Status On-going      OT LONG TERM GOAL #2   Title Patient will increase her right hand grip strength by 25# and pinch strength by 10# in order to change diapers and open containers/bottles with no difficulty.    Time 8    Period Weeks    Status On-going      OT LONG TERM GOAL #3   Title Patient will demonstrate an increase in coordination by completing the 9 hole peg test in 30" or less in order to progress her handwriting legibility to as close to baseline as possible.    Time 8    Period Weeks    Status On-going      OT LONG TERM GOAL #4   Title Patient will demonstrate improved gross motor coordination while using her right hand as her dominant extremity during self feeding activities 100% of the time.    Time 8    Period Weeks    Status On-going                 Plan - 12/11/20 1541    Clinical Impression Statement A: focusing on isolated wrist and finger movements during session with use of tweezers and squigz on table top. Hand fatigue was demonstrated towards end of session during grooved pegboard task and stack of sponges with resistive clothespins. Rest breaks were taken more frequently as session progress. VC for form and technique were provided. Unable to complete last tower due to fatigue in hand causing a greater decrease in coordination.    Plan P: Follow up on use of Tip Grip and worksheets provided two sessions ago. Complete in hand manipulation task. Continue to work on isolated wrist and finger movement tasks. Proximal shoulder strengthening with ball on the wall.    Consulted and Agree with Plan of Care Patient           Patient will benefit from skilled therapeutic intervention in order to  improve the following  deficits and impairments:           Visit Diagnosis: Other lack of coordination  Other symptoms and signs involving the musculoskeletal system    Problem List Patient Active Problem List   Diagnosis Date Noted  . Previous cesarean section 01/11/2019  . S/P cesarean section 01/11/2019  . Gestational diabetes 09/12/2016   Limmie Patricia, OTR/L,CBIS  [phone]  12/11/2020, 3:45 PM  [facility] [address] [place], [place], [address] Phone: [phone]   Fax:  [phone]  Name: Kelly [name] MRN: [id number] Date of Birth: January 18, 1986

## 2020-12-12 ENCOUNTER — Telehealth: Payer: Self-pay

## 2020-12-12 NOTE — Telephone Encounter (Signed)
Patient states that she was placed on it by her OB- advise her to call them.  Patient voiced understanding.

## 2020-12-14 ENCOUNTER — Encounter (HOSPITAL_COMMUNITY): Payer: Self-pay | Admitting: Occupational Therapy

## 2020-12-14 ENCOUNTER — Ambulatory Visit (HOSPITAL_COMMUNITY): Payer: BC Managed Care – PPO | Admitting: Occupational Therapy

## 2020-12-14 ENCOUNTER — Other Ambulatory Visit: Payer: Self-pay

## 2020-12-14 ENCOUNTER — Ambulatory Visit (HOSPITAL_COMMUNITY): Payer: BC Managed Care – PPO | Admitting: Physical Therapy

## 2020-12-14 ENCOUNTER — Encounter (HOSPITAL_COMMUNITY): Payer: Self-pay | Admitting: Physical Therapy

## 2020-12-14 DIAGNOSIS — M6281 Muscle weakness (generalized): Secondary | ICD-10-CM

## 2020-12-14 DIAGNOSIS — R29898 Other symptoms and signs involving the musculoskeletal system: Secondary | ICD-10-CM | POA: Diagnosis not present

## 2020-12-14 DIAGNOSIS — R278 Other lack of coordination: Secondary | ICD-10-CM

## 2020-12-14 DIAGNOSIS — R262 Difficulty in walking, not elsewhere classified: Secondary | ICD-10-CM

## 2020-12-14 NOTE — Therapy (Signed)
[place] [facility] [address] [place], [place], [address] Phone: [phone]   Fax:  [phone]  Physical Therapy Treatment  Patient Details  Name: Kelly [name] MRN: [id number] Date of Birth: December 07, 1985 Referring Provider (PT): Raliegh Ip, DO   Encounter Date: 12/14/2020   PT End of Session - 12/14/20 1353    Visit Number 7    Number of Visits 16    Date for PT Re-Evaluation 01/16/21    Authorization Type [employer]BS [facility] [employer], no auth required, no VL    Progress Note Due on Visit 10    PT Start Time 1355    PT Stop Time 1433    PT Time Calculation (min) 38 min    Equipment Utilized During Treatment --    Activity Tolerance Patient tolerated treatment well    Behavior During Therapy [facility] for tasks assessed/performed           Past Medical History:  Diagnosis Date  . Headache   . Missed abortion 05/2015   no surgery required  . PONV (postoperative nausea and vomiting)     Past Surgical History:  Procedure Laterality Date  . CESAREAN SECTION N/A 09/12/2016   Procedure: CESAREAN SECTION;  Surgeon: Zelphia Cairo, MD;  Location: [facility] BIRTHING SUITES;  Service: Obstetrics;  Laterality: N/A;  RNFA  . CESAREAN SECTION N/A 01/11/2019   Procedure: CESAREAN SECTION;  Surgeon: Zelphia Cairo, MD;  Location: [facility] BIRTHING SUITES;  Service: Obstetrics;  Laterality: N/A;  Repeat edc 01/15/19 NKDA [name] RNFA  . DILATION AND EVACUATION N/A 12/23/2017   Procedure: DILATATION AND EVACUATION with Intraoperative Ultrasound ;  Surgeon: Zelphia Cairo, MD;  Location: [facility];  Service: Gynecology;  Laterality: N/A;  . WISDOM TOOTH EXTRACTION      There were no vitals filed for this visit.   Subjective Assessment - 12/14/20 1359    Subjective Patient reports shes doing well and does not have any pain. States shes been very dehydrated and has been drinking a lot of water. Reports she thinks her leg is getting stronger.    Currently  in Pain? No/denies                             [facility] Adult PT Treatment/Exercise - 12/14/20 1420      Ambulation/Gait   Gait Comments lateral walks over 3 hurdles 3x10 ft B; fwd steps over 3 hurdles   Verbal and visual cues for LE alignment and preventing knee IR     Balance   Balance Assessed Yes      Dynamic Sitting Balance   Dynamic Sitting - Balance Activities --   1) tall kneel pulling off squigs in PNF pattern 2x6, 2) half kneel to stand pulling off squigs x6 B     Dynamic Standing Balance   Dynamic Standing - Balance Support --   pulling squigs off door x6, squigs off door HR to arm level x6, squigs HR to squat x6                   PT Short Term Goals - 11/21/20 1546      PT SHORT TERM GOAL #1   Title Patient will increase RLE strength to 5/5 to promote ambulation without AD    Baseline 3+/5 to 4/5 RLE    Time 4    Period Weeks    Status New    Target Date 12/19/20  PT SHORT TERM GOAL #2   Title Patient will decrease risk for falls as evidenced by time of 13 seconds for 5 Times Sit to Stand test    Baseline 15.75 sec    Time 4    Period Weeks    Status New    Target Date 12/19/20      PT SHORT TERM GOAL #3   Title Patient will ambulate 500 ft without AD and normalized gait pattern during    Baseline 256 ft with CGA    Time 4    Period Weeks    Status New    Target Date 12/19/20             PT Long Term Goals - 11/21/20 1548      PT LONG TERM GOAL #1   Title Patient will be able to ambulate independently with normalized gait pattern x 600 ft during    Baseline 256 ft w/ CGA    Time 8    Period Weeks    Status New    Target Date 01/16/21      PT LONG TERM GOAL #2   Title Patient will demonstrate low risk for falls per score of 22/24 Dynamic Gait Index    Baseline DNT    Time 8    Period Weeks    Status New    Target Date 01/16/21      PT LONG TERM GOAL #3   Title Patient will be independent in complex  HEP to improve self-efficacy    Baseline in development    Time 8    Period Weeks    Status New    Target Date 01/16/21                 Plan - 12/14/20 1644    Clinical Impression Statement Patient did great to PT after OT this session. She demoed good within session improvements in balance and coordination, including in half kneel, although demoed increased difficulty in half kneel on R vs L consistent with diagnosis. Pulled squigs with RUE throughout session to improve R strength and challenge balance the most. Demo increased difficutly with squatting and transitions, consistent with patient report. Completed lateral and fwd step overs of hurdles allowing for improvement in coordination and balance, as well as visual attention to surroundings and space to allow for continued safety into home with young children and toys. Continues to benefit from skilled PT to improve safety, coordination, and balance.    Personal Factors and Comorbidities Comorbidity 1    Comorbidities cryptogenic nature of stroke, pregnancy    Examination-Activity Limitations Bathing;Bend;Caring for Others;Carry;Hygiene/Grooming;Locomotion Level;Reach Overhead;Squat;Stairs;Stand;Toileting;Transfers    Examination-Participation Restrictions Cleaning;Community Activity;Driving;Laundry;Meal Prep;Occupation;Yard Work;Shop    Stability/Clinical Decision Making Evolving/Moderate complexity    Rehab Potential Good    PT Frequency 2x / week    PT Duration 8 weeks    PT Treatment/Interventions ADLs/Self Care Home Management;Aquatic Therapy;Biofeedback;Cryotherapy;Moist Heat;Traction;Balance training;Therapeutic exercise;Therapeutic activities;Functional mobility training;Stair training;Gait training;DME Instruction;Neuromuscular re-education;Patient/family education;Orthotic Fit/Training;Manual techniques;Vestibular;Taping;Splinting;Energy conservation;Passive range of motion;Joint Manipulations    PT Next Visit Plan Continue  with gait training using least restrictive AD, gross motor coordination RLE, tone integration techniques, gait cycle phases    PT Home Exercise Plan Gait training with cane, Sit to stands with emphasis on LE symmetry and eccentric lowering. 12/28: backwards walking, side stepping, runner's step up. 1/10 single leg balance at kitchen sink with left foot in base cabinet, ankle dorsiflexion    Consulted and Agree with  Plan of Care Patient           Patient will benefit from skilled therapeutic intervention in order to improve the following deficits and impairments:  Abnormal gait,Decreased activity tolerance,Decreased balance,Decreased coordination,Decreased mobility,Decreased knowledge of use of DME,Decreased knowledge of precautions,Decreased endurance,Decreased strength,Difficulty walking,Impaired perceived functional ability,Impaired tone,Impaired UE functional use,Improper body mechanics  Visit Diagnosis: Other lack of coordination  Other symptoms and signs involving the musculoskeletal system  Muscle weakness (generalized)  Difficulty in walking, not elsewhere classified     Problem List Patient Active Problem List   Diagnosis Date Noted  . Previous cesarean section 01/11/2019  . S/P cesarean section 01/11/2019  . Gestational diabetes 09/12/2016   4:52 PM,12/14/20 Esmeralda Links, PT, DPT Physical Therapist at [facility] [address] [place], [place], [address] Phone: [phone]   Fax:  [phone]  Name: Kelly [name] MRN: [id number] Date of Birth: 01-Aug-1986

## 2020-12-14 NOTE — Therapy (Signed)
[place] [facility] [address] [place], [place], [address] Phone: [phone]   Fax:  [phone]  Occupational Therapy Treatment  Patient Details  Name: Kelly [name] MRN: [id number] Date of Birth: 22-Sep-1986 Referring Provider (OT): Delynn Flavin, DO   Encounter Date: 12/14/2020   OT End of Session - 12/14/20 1342    Visit Number 7    Number of Visits 12    Date for OT Re-Evaluation 01/16/21   mini reassess on 12/19/20   Authorization Type BSBC    Authorization Time Period BCBS 2022 benefits unknown at time of this appointment (12/06/20). Front desk will inform therapist when known.    OT Start Time 1301    OT Stop Time 1343    OT Time Calculation (min) 42 min    Activity Tolerance Patient tolerated treatment well    Behavior During Therapy WFL for tasks assessed/performed           Past Medical History:  Diagnosis Date  . Headache   . Missed abortion 05/2015   no surgery required  . PONV (postoperative nausea and vomiting)     Past Surgical History:  Procedure Laterality Date  . CESAREAN SECTION N/A 09/12/2016   Procedure: CESAREAN SECTION;  Surgeon: Zelphia Cairo, MD;  Location: [facility] BIRTHING SUITES;  Service: Obstetrics;  Laterality: N/A;  RNFA  . CESAREAN SECTION N/A 01/11/2019   Procedure: CESAREAN SECTION;  Surgeon: Zelphia Cairo, MD;  Location: [facility] BIRTHING SUITES;  Service: Obstetrics;  Laterality: N/A;  Repeat edc 01/15/19 NKDA [name] RNFA  . DILATION AND EVACUATION N/A 12/23/2017   Procedure: DILATATION AND EVACUATION with Intraoperative Ultrasound ;  Surgeon: Zelphia Cairo, MD;  Location: [facility];  Service: Gynecology;  Laterality: N/A;  . WISDOM TOOTH EXTRACTION      There were no vitals filed for this visit.       [facility] OT Assessment - 12/14/20 1300      Assessment   Medical Diagnosis CVA right side weakness      Precautions   Precautions Other (comment)    Precaution Comments Pt is [redacted] weeks  pregnant on 12/11/20                    OT Treatments/Exercises (OP) - 12/14/20 1308      Exercises   Exercises Hand;Elbow      Additional Elbow Exercises   Hand Gripper with Large Beads all beads gripper at 35#, vertical    Hand Gripper with Medium Beads all beads gripper at 35#, vertical    Hand Gripper with Small Beads all beads gripper at 35#, horizontal & vertical      Hand Exercises   Other Hand Exercises Pt pulling through red theraputty using pvc pipe, working on grip strength and wrist stability    Other Hand Exercises Pt using pvc pipe to cut circles into red theraputty, working on wrist stability, grip and pinch strength      Neurological Re-education Exercises   Other Exercises 1 Pt cutting out square, triangle, large and small circles using adult scissors. Min difficulty with shapes, increased time needed compared to baseline.      Fine Motor Coordination (Hand/Wrist)   Fine Motor Coordination Grooved pegs    Handwriting pt working on pencil control packet, using notebook for slantboard to decrease pressure placed on pencil when writing. Various directions-zig zags, curved lines, shapes included in packet. Tracing fading from bold to dashed lines, to connect the dots style.  Grooved pegs Pt holding pegs in palm working on palm to fingertip translation to bring to fingers and place into pegboard. Min difficulty, increased time to complete task.                    OT Short Term Goals - 11/23/20 1417      OT SHORT TERM GOAL #1   Title Patient will be educated and independent with HEP in order to faciliate her progress in therapy and allow her to return to using her right UE as her dominant for 75% or more of the time.    Time 4    Period Weeks    Status On-going    Target Date 12/19/20      OT SHORT TERM GOAL #2   Title Patient increase her right grip strength by 10# and pinch strength by 5# in order to manipulate and open (not tight) jars or  containers with less difficulty.    Time 4    Period Weeks    Status On-going      OT SHORT TERM GOAL #3   Title Patient will demonstrate an increase with right hand coordination by completing the 9 hole peg test in 50" or less allowing her to complet handwriting tasks with moderate difficulty and fatigue.    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    Time 8    Period Weeks    Status On-going      OT LONG TERM GOAL #2   Title Patient will increase her right hand grip strength by 25# and pinch strength by 10# in order to change diapers and open containers/bottles with no difficulty.    Time 8    Period Weeks    Status On-going      OT LONG TERM GOAL #3   Title Patient will demonstrate an increase in coordination by completing the 9 hole peg test in 30" or less in order to progress her handwriting legibility to as close to baseline as possible.    Time 8    Period Weeks    Status On-going      OT LONG TERM GOAL #4   Title Patient will demonstrate improved gross motor coordination while using her right hand as her dominant extremity during self feeding activities 100% of the time.    Time 8    Period Weeks    Status On-going                 Plan - 12/14/20 1330    Clinical Impression Statement A: Continued with grip strengthening, increasing gripper resistance to 35# today for all bead sizes. Improvement noted during in-hand manipulation with grooved pegs, no pegs droped during task. Pt completing pencil control patterns, working on distal stability of wrist and digits, good isolated fine motor control, notebook used in place of slant board. Adding theraputty tasks for grip strength, wrist strength and stability. Provided pt with remainder of  pencil control packet to complete at home.    Body Structure / Function / Physical Skills ADL;Endurance;UE functional use;FMC;GMC;Coordination;Sensation;IADL;Strength    Plan P: Follow up on pencil control  packet, continue with in-hand manipulation and wrist stability. Ball on wall    OT Home Exercise Plan Eval: yellow theraputty - grip and pinch strength. Proximal shoulder strengthening and coordination tasks; 12/28: updated to red theraputty; 1/13: pencil control packet.    Consulted and Agree with Plan of Care Patient           Patient will benefit from skilled therapeutic intervention in order to improve the following deficits and impairments:   Body Structure / Function / Physical Skills: ADL,Endurance,UE functional use,FMC,GMC,Coordination,Sensation,IADL,Strength       Visit Diagnosis: Other lack of coordination  Other symptoms and signs involving the musculoskeletal system    Problem List Patient Active Problem List   Diagnosis Date Noted  . Previous cesarean section 01/11/2019  . S/P cesarean section 01/11/2019  . Gestational diabetes 09/12/2016   Ezra Sites, OTR/L  [phone] 12/14/2020, 1:44 PM  [place] [facility] [address] [place], [place], [address] Phone: [phone]   Fax:  [phone]  Name: Kelly [name] MRN: [id number] Date of Birth: July 29, 1986

## 2020-12-18 ENCOUNTER — Ambulatory Visit (HOSPITAL_COMMUNITY): Payer: BC Managed Care – PPO

## 2020-12-18 NOTE — Telephone Encounter (Signed)
Please refer to encounter from 1/11 This encounter will be closed

## 2020-12-20 ENCOUNTER — Other Ambulatory Visit: Payer: Self-pay

## 2020-12-20 ENCOUNTER — Ambulatory Visit (HOSPITAL_COMMUNITY): Payer: BC Managed Care – PPO | Admitting: Occupational Therapy

## 2020-12-20 ENCOUNTER — Ambulatory Visit (HOSPITAL_COMMUNITY): Payer: BC Managed Care – PPO

## 2020-12-20 ENCOUNTER — Encounter (HOSPITAL_COMMUNITY): Payer: Self-pay

## 2020-12-20 DIAGNOSIS — R262 Difficulty in walking, not elsewhere classified: Secondary | ICD-10-CM

## 2020-12-20 DIAGNOSIS — M6281 Muscle weakness (generalized): Secondary | ICD-10-CM

## 2020-12-20 DIAGNOSIS — R29898 Other symptoms and signs involving the musculoskeletal system: Secondary | ICD-10-CM | POA: Diagnosis not present

## 2020-12-20 DIAGNOSIS — R278 Other lack of coordination: Secondary | ICD-10-CM

## 2020-12-20 DIAGNOSIS — R2689 Other abnormalities of gait and mobility: Secondary | ICD-10-CM

## 2020-12-20 NOTE — Therapy (Signed)
[facility] [address] [place], [place], [address] Phone: [phone]   Fax:  [phone]  Physical Therapy Treatment  Patient Details  Name: Kelly [name] MRN: [id number] Date of Birth: 1985-12-23 Referring Provider (PT): Raliegh Ip, DO   Encounter Date: 12/20/2020   PT End of Session - 12/20/20 1257    Visit Number 8    Number of Visits 16    Date for PT Re-Evaluation 01/16/21    Authorization Type [employer], no auth required, no VL    Progress Note Due on Visit 10    PT Start Time 1300    PT Stop Time 1345    PT Time Calculation (min) 45 min    Equipment Utilized During Treatment Gait belt    Activity Tolerance Patient tolerated treatment well    Behavior During Therapy [facility] for tasks assessed/performed           Past Medical History:  Diagnosis Date  . Headache   . Missed abortion 05/2015   no surgery required  . PONV (postoperative nausea and vomiting)     Past Surgical History:  Procedure Laterality Date  . CESAREAN SECTION N/A 09/12/2016   Procedure: CESAREAN SECTION;  Surgeon: Zelphia Cairo, MD;  Location: [facility] BIRTHING SUITES;  Service: Obstetrics;  Laterality: N/A;  RNFA  . CESAREAN SECTION N/A 01/11/2019   Procedure: CESAREAN SECTION;  Surgeon: Zelphia Cairo, MD;  Location: [facility] BIRTHING SUITES;  Service: Obstetrics;  Laterality: N/A;  Repeat edc 01/15/19 NKDA [name] RNFA  . DILATION AND EVACUATION N/A 12/23/2017   Procedure: DILATATION AND EVACUATION with Intraoperative Ultrasound ;  Surgeon: Zelphia Cairo, MD;  Location: [facility];  Service: Gynecology;  Laterality: N/A;  . WISDOM TOOTH EXTRACTION      There were no vitals filed for this visit.   Subjective Assessment - 12/20/20 1304    Subjective Patient reports she has been focusing her ambulation to improve heel-toe pattern with walking and has recently obtained kinesiotape to apply to right tib anterior                              [facility] Adult PT Treatment/Exercise - 12/20/20 0001      Ambulation/Gait   Ambulation/Gait Yes    Ambulation/Gait Assistance 7: Independent    Ambulation Distance (Feet) 400 Feet   x 6 increments   Assistive device None    Gait Pattern Decreased stance time - right;Decreased stride length;Decreased dorsiflexion - right;Right steppage   improved pattern with Ktape   Ambulation Surface Level    Gait velocity emphasis on increased speed during trials with improved fluidity and coordination appreciated with occasional cues for hip extension    Pre-Gait Activities training in reciprocal arm swing to reduce RUE high guard position with emphasis on large amplitude down to fast, running arm pumps to improve reciprocal motion    Gait Comments retrowalking 4x40 ft to facilitate hip/knee extension      Knee/Hip Exercises: Standing   Knee Flexion Strengthening;Both;2 sets;10 reps   10 lbs   Hip Flexion Stengthening;Both   2x2 min alternating stair taps with 8" step using 10 lbs ankle weights     Knee/Hip Exercises: Seated   Long Arc Quad Strengthening;Right;3 sets;10 reps   20 lbs                 PT Education - 12/20/20 1316    Education Details patient  instructed in kinesiotape application to right tib anterior for dorsiflexion assist    Person(s) Educated Patient    Methods Explanation;Demonstration    Comprehension Verbalized understanding;Returned demonstration            PT Short Term Goals - 11/21/20 1546      PT SHORT TERM GOAL #1   Title Patient will increase RLE strength to 5/5 to promote ambulation without AD    Baseline 3+/5 to 4/5 RLE    Time 4    Period Weeks    Status New    Target Date 12/19/20      PT SHORT TERM GOAL #2   Title Patient will decrease risk for falls as evidenced by time of 13 seconds for 5 Times Sit to Stand test    Baseline 15.75 sec    Time 4    Period Weeks    Status New    Target Date 12/19/20       PT SHORT TERM GOAL #3   Title Patient will ambulate 500 ft without AD and normalized gait pattern during    Baseline 256 ft with CGA    Time 4    Period Weeks    Status New    Target Date 12/19/20             PT Long Term Goals - 11/21/20 1548      PT LONG TERM GOAL #1   Title Patient will be able to ambulate independently with normalized gait pattern x 600 ft during    Baseline 256 ft w/ CGA    Time 8    Period Weeks    Status New    Target Date 01/16/21      PT LONG TERM GOAL #2   Title Patient will demonstrate low risk for falls per score of 22/24 Dynamic Gait Index    Baseline DNT    Time 8    Period Weeks    Status New    Target Date 01/16/21      PT LONG TERM GOAL #3   Title Patient will be independent in complex HEP to improve self-efficacy    Baseline in development    Time 8    Period Weeks    Status New    Target Date 01/16/21                 Plan - 12/20/20 1346    Clinical Impression Statement Demonstrates improved gait mechanics when increase in velocity is emphasized and cues for minimizing RUE high guard position through use of 1 lbs wrsit weight. Patient tolerating tx sessions well and demonstrates improved gait and body mechanics as well as selective control RLE    Personal Factors and Comorbidities Comorbidity 1    Comorbidities cryptogenic nature of stroke, pregnancy    Examination-Activity Limitations Bathing;Bend;Caring for Others;Carry;Hygiene/Grooming;Locomotion Level;Reach Overhead;Squat;Stairs;Stand;Toileting;Transfers    Examination-Participation Restrictions Cleaning;Community Activity;Driving;Laundry;Meal Prep;Occupation;Yard Work;Shop    Stability/Clinical Decision Making Evolving/Moderate complexity    Rehab Potential Good    PT Frequency 2x / week    PT Duration 8 weeks    PT Treatment/Interventions ADLs/Self Care Home Management;Aquatic Therapy;Biofeedback;Cryotherapy;Moist Heat;Traction;Balance training;Therapeutic  exercise;Therapeutic activities;Functional mobility training;Stair training;Gait training;DME Instruction;Neuromuscular re-education;Patient/family education;Orthotic Fit/Training;Manual techniques;Vestibular;Taping;Splinting;Energy conservation;Passive range of motion;Joint Manipulations    PT Next Visit Plan Continue with gait training using least restrictive AD, gross motor coordination RLE, tone integration techniques, gait cycle phases    PT Home Exercise Plan Gait training with cane, Sit to stands  with emphasis on LE symmetry and eccentric lowering. 12/28: backwards walking, side stepping, runner's step up. 1/10 single leg balance at kitchen sink with left foot in base cabinet, ankle dorsiflexion    Consulted and Agree with Plan of Care Patient           Patient will benefit from skilled therapeutic intervention in order to improve the following deficits and impairments:  Abnormal gait,Decreased activity tolerance,Decreased balance,Decreased coordination,Decreased mobility,Decreased knowledge of use of DME,Decreased knowledge of precautions,Decreased endurance,Decreased strength,Difficulty walking,Impaired perceived functional ability,Impaired tone,Impaired UE functional use,Improper body mechanics  Visit Diagnosis: Muscle weakness (generalized)  Difficulty in walking, not elsewhere classified  Other abnormalities of gait and mobility     Problem List Patient Active Problem List   Diagnosis Date Noted  . Previous cesarean section 01/11/2019  . S/P cesarean section 01/11/2019  . Gestational diabetes 09/12/2016   1:48 PM, 12/20/20 M. Shary Decamp, PT, DPT Physical Therapist- [place] Office Number: [phone]  [facility] [address] [place], [place], [address] Phone: [phone]   Fax:  [phone]  Name: Ailah Barna MRN: [id number] Date of Birth: Nov 15, 1986

## 2020-12-20 NOTE — Therapy (Signed)
[place], [place], [address] Phone: [phone]   Fax:  [phone]  Occupational Therapy Treatment  Patient Details  Name: Kelly [name] MRN: [id number] Date of Birth: May 19, 1986 Referring Provider (OT): Ronnie Doss, DO   Encounter Date: 12/20/2020   OT End of Session - 12/20/20 1427    Visit Number 8    Number of Visits 12    Date for OT Re-Evaluation 01/16/21    Authorization Type BSBC    Authorization Time Period [employer]BS 2022 benefits unknown at time of this appointment (12/06/20). Front desk will inform therapist when known.    OT Start Time 1346    OT Stop Time 1426    OT Time Calculation (min) 40 min    Activity Tolerance Patient tolerated treatment well    Behavior During Therapy WFL for tasks assessed/performed           Past Medical History:  Diagnosis Date  . Headache   . Missed abortion 05/2015   no surgery required  . PONV (postoperative nausea and vomiting)     Past Surgical History:  Procedure Laterality Date  . CESAREAN SECTION N/A 09/12/2016   Procedure: CESAREAN SECTION;  Surgeon: Marylynn Pearson, MD;  Location: [place];  Service: Obstetrics;  Laterality: N/A;  RNFA  . CESAREAN SECTION N/A 01/11/2019   Procedure: CESAREAN SECTION;  Surgeon: Marylynn Pearson, MD;  Location: [place];  Service: Obstetrics;  Laterality: N/A;  Repeat edc 01/15/19 NKDA [name] RNFA  . DILATION AND EVACUATION N/A 12/23/2017   Procedure: DILATATION AND EVACUATION with Intraoperative Ultrasound ;  Surgeon: Marylynn Pearson, MD;  Location: [place];  Service: Gynecology;  Laterality: N/A;  . WISDOM TOOTH EXTRACTION      There were no vitals filed for this visit.   Subjective Assessment - 12/20/20 1347    Subjective  S: No pain reported this date.    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 Dr. Lajuana Ripple has referred patient to occupational therapy for evaluation and treatment.    Currently in Pain? No/denies              [facility] OT Assessment - 12/20/20 0001      Assessment   Medical Diagnosis CVA right side weakness      Precautions   Precautions Other (comment)    Precaution Comments Pt is [redacted] weeks pregnant on 12/11/20      Coordination   Right 9 Hole Peg Test 37.64   Previous 1'08"     Strength   Right Shoulder Flexion 4+/5   4/5 previous   Right Shoulder ABduction 4+/5   same as previous   Right Shoulder Internal Rotation 4+/5   4/5 previous   Right Shoulder External Rotation 4-/5   same as previous   Right Elbow Extension 4/5   same as previous   Right Forearm Pronation 4+/5   5/5 previous   Right Forearm Supination 4-/5   5/5 previous   Right Hand Grip (lbs) 65   25 previous   Right Hand Lateral Pinch 15 lbs   7 previous   Right Hand 3 Point Pinch 18 lbs   6 previous  OT Treatments/Exercises (OP) - 12/20/20 0001      Exercises   Exercises Hand;Elbow      Hand Exercises   Other Hand Exercises Pt completed resistive clothes pins green and blue with 3 jaw chuck grasp primarily switching to lateral pinch for last ~8 reps of placement of blue pins.      Fine Motor Coordination (Hand/Wrist)   Fine Motor Coordination Small Pegboard    Small Pegboard Pt placed pegs into red putty with minimal difficulty. Pt removed pegs utilizing in hand maniuplation with minimal dificulty as seen by dropping pegs at times.    Handwriting Pt worked on making circle and square shapes as well as write her name in red putty using a small peg as the writing utensil. Noted minimal instability in R shoulder as seen by lack of fluidity in movement. Moderate difficulty with dexterity  and coordination whyen writing name in putty.                    OT Short Term Goals - 12/20/20 1406      OT SHORT TERM GOAL #1   Title Patient will be educated and independent with HEP in order to faciliate her progress in therapy and allow her to return to using her right UE as her dominant for 75% or more of the time.    Time 4    Period Weeks    Status On-going    Target Date 12/19/20      OT SHORT TERM GOAL #2   Title Patient increase her right grip strength by 10# and pinch strength by 5# in order to manipulate and open (not tight) jars or containers with less difficulty.    Time 4    Period Weeks    Status Achieved      OT SHORT TERM GOAL #3   Title Patient will demonstrate an increase with right hand coordination by completing the 9 hole peg test in 50" or less allowing her to complet handwriting tasks with moderate difficulty and fatigue.    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    Time 8    Period Weeks    Status On-going      OT LONG TERM GOAL #2   Title Patient will increase her right hand grip strength by 25# and pinch strength by 10# in order to change diapers and open containers/bottles with no difficulty.    Time 8    Period Weeks    Status Partially Met      OT LONG TERM GOAL #3   Title Patient will demonstrate an increase in coordination by completing the 9 hole peg test in 30" or less in order to progress her handwriting legibility to as close to baseline as possible.    Time 8    Period Weeks    Status On-going      OT LONG TERM GOAL #4   Title Patient will demonstrate improved gross motor coordination while using her right hand as her dominant extremity during self feeding activities 100% of the time.    Time 8     Period Weeks  Status On-going                 Plan - 12/20/20 1428    Clinical Impression Statement A: Continued to grip strengthening to start session. Pt able to place small pegs in red puty with minimal difficulty at times. Completed strength and coordination assessments with noted improvements in majority of goal area. Pt completing pinch strengthing and R UE coordination task of making shapes and writing her name in red putty using small peg as writing tool. Pt encouraged to self-feed using R UE.    Body Structure / Function / Physical Skills ADL;Endurance;UE functional use;FMC;GMC;Coordination;Sensation;IADL;Strength    Plan P: Follow up with increase in self-feeding at home. Address more proximal R UE strengthening to improve coordination.    OT Home Exercise Plan Eval: yellow theraputty - grip and pinch strength. Proximal shoulder strengthening and coordination tasks; 12/28: updated to red theraputty; 1/13: pencil control packet.    Consulted and Agree with Plan of Care Patient           Patient will benefit from skilled therapeutic intervention in order to improve the following deficits and impairments:   Body Structure / Function / Physical Skills: ADL,Endurance,UE functional use,FMC,GMC,Coordination,Sensation,IADL,Strength       Visit Diagnosis: Other lack of coordination  Other symptoms and signs involving the musculoskeletal system    Problem List Patient Active Problem List   Diagnosis Date Noted  . Previous cesarean section 01/11/2019  . S/P cesarean section 01/11/2019  . Gestational diabetes 09/12/2016   Guadelupe Sabin, OTR/L  [phone] 12/20/2020, 2:38 PM  [place] [address] [place], [place], [address] Phone: [phone]   Fax:  [phone]  Name: Kelly [name] MRN: [id number] Date of Birth: 02/15/1986

## 2020-12-25 ENCOUNTER — Ambulatory Visit (HOSPITAL_COMMUNITY): Payer: BC Managed Care – PPO

## 2020-12-25 ENCOUNTER — Telehealth (HOSPITAL_COMMUNITY): Payer: Self-pay

## 2020-12-25 NOTE — Telephone Encounter (Signed)
She has another appointment in [facility] and will not be here today

## 2020-12-26 DIAGNOSIS — Z8632 Personal history of gestational diabetes: Secondary | ICD-10-CM

## 2020-12-26 HISTORY — DX: Personal history of gestational diabetes: Z86.32

## 2020-12-27 ENCOUNTER — Ambulatory Visit (HOSPITAL_COMMUNITY): Payer: BC Managed Care – PPO

## 2020-12-27 ENCOUNTER — Ambulatory Visit (HOSPITAL_COMMUNITY): Payer: BC Managed Care – PPO | Admitting: Physical Therapy

## 2021-01-01 ENCOUNTER — Encounter (HOSPITAL_COMMUNITY): Payer: BC Managed Care – PPO

## 2021-01-01 ENCOUNTER — Ambulatory Visit (HOSPITAL_COMMUNITY): Payer: BC Managed Care – PPO

## 2021-01-03 ENCOUNTER — Ambulatory Visit (INDEPENDENT_AMBULATORY_CARE_PROVIDER_SITE_OTHER): Payer: BC Managed Care – PPO | Admitting: Family Medicine

## 2021-01-03 ENCOUNTER — Encounter: Payer: Self-pay | Admitting: Family Medicine

## 2021-01-03 ENCOUNTER — Encounter (HOSPITAL_COMMUNITY): Payer: BC Managed Care – PPO

## 2021-01-03 ENCOUNTER — Ambulatory Visit (HOSPITAL_COMMUNITY): Payer: BC Managed Care – PPO

## 2021-01-03 DIAGNOSIS — J019 Acute sinusitis, unspecified: Secondary | ICD-10-CM | POA: Diagnosis not present

## 2021-01-03 MED ORDER — CEFDINIR 300 MG PO CAPS: 300.0000 mg | ORAL_CAPSULE | Freq: Two times a day (BID) | ORAL | 0 refills | Status: AC

## 2021-01-03 NOTE — Progress Notes (Signed)
   Virtual Visit via telephone Note Due to COVID-19 pandemic this visit was conducted virtually. This visit type was conducted due to national recommendations for restrictions regarding the COVID-19 Pandemic (e.g. social distancing, sheltering in place) in an effort to limit this patient's exposure and mitigate transmission in our community. All issues noted in this document were discussed and addressed.  A physical exam was not performed with this format.  I connected with Kelly [name] on 01/03/21 at 1508 by telephone and verified that I am speaking with the correct person using two identifiers. Kelly [name] is currently located at home and her mother is currently with her during the visit. The provider, Gabriel Earing, [name] is located in their office at time of visit.  I discussed the limitations, risks, security and privacy concerns of performing an evaluation and management service by telephone and the availability of in person appointments. I also discussed with the patient that there may be a patient responsible charge related to this service. The patient expressed understanding and agreed to proceed.  CC: sinusitis  History and Present Illness:  HPI  Kelly [name] tested positive for Covid 19 when her symptoms started about 12 days ago. She has been using a nettipot, humidifier, and mucinex for her symptoms. Today her head congestion is worse. She also reports ear congestion, facial pressure, and a frontal headache. She reports a history of frequent sinus infections. She is currently 24 weeks pregnany. She is taking zoloft, lovenox, aspirin, and hydroxyzine daily. She denies shortness of breath, fever, body aches, chills, or chest pain.     ROS As per HPI.   Observations/Objective: Alert and oriented x 3. Able to speak in full sentences without difficulty.   Assessment and Plan: Diagnoses and all orders for this visit:  Acute sinusitis, recurrence not specified,  unspecified location Continue nettipot, humidifier, mucinex. Cefdinir added. Push fluids. Return to office for new or worsening symptoms, or if symptoms persist.  -     cefdinir (OMNICEF) 300 MG capsule; Take 1 capsule (300 mg total) by mouth 2 (two) times daily for 10 days. 1 po BID     Follow Up Instructions: As needed.     I discussed the assessment and treatment plan with the patient. The patient was provided an opportunity to ask questions and all were answered. The patient agreed with the plan and demonstrated an understanding of the instructions.   The patient was advised to call back or seek an in-person evaluation if the symptoms worsen or if the condition fails to improve as anticipated.  The above assessment and management plan was discussed with the patient. The patient verbalized understanding of and has agreed to the management plan. Patient is aware to call the clinic if symptoms persist or worsen. Patient is aware when to return to the clinic for a follow-up visit. Patient educated on when it is appropriate to go to the emergency department.   Time call ended:  1514   Kelly Lotter Hughes Better, [name]

## 2021-01-05 ENCOUNTER — Ambulatory Visit (HOSPITAL_COMMUNITY): Payer: BC Managed Care – PPO | Admitting: Specialist

## 2021-01-05 ENCOUNTER — Telehealth (HOSPITAL_COMMUNITY): Payer: Self-pay | Admitting: Specialist

## 2021-01-05 ENCOUNTER — Telehealth (HOSPITAL_COMMUNITY): Payer: Self-pay

## 2021-01-05 ENCOUNTER — Ambulatory Visit (HOSPITAL_COMMUNITY): Payer: BC Managed Care – PPO

## 2021-01-05 NOTE — Telephone Encounter (Signed)
pt called to cx today's appt due to she has another appt in [place]

## 2021-01-05 NOTE — Telephone Encounter (Signed)
pt called to cx today's appt due to she has another appt in [place] 

## 2021-01-08 ENCOUNTER — Other Ambulatory Visit: Payer: Self-pay

## 2021-01-08 ENCOUNTER — Encounter (HOSPITAL_COMMUNITY): Payer: Self-pay

## 2021-01-08 ENCOUNTER — Ambulatory Visit (HOSPITAL_COMMUNITY): Payer: BC Managed Care – PPO

## 2021-01-08 ENCOUNTER — Ambulatory Visit (HOSPITAL_COMMUNITY): Payer: BC Managed Care – PPO | Attending: Family Medicine

## 2021-01-08 VITALS — BP 108/63 | HR 87

## 2021-01-08 DIAGNOSIS — R262 Difficulty in walking, not elsewhere classified: Secondary | ICD-10-CM | POA: Diagnosis present

## 2021-01-08 DIAGNOSIS — R29898 Other symptoms and signs involving the musculoskeletal system: Secondary | ICD-10-CM | POA: Diagnosis present

## 2021-01-08 DIAGNOSIS — M6281 Muscle weakness (generalized): Secondary | ICD-10-CM | POA: Diagnosis present

## 2021-01-08 DIAGNOSIS — R278 Other lack of coordination: Secondary | ICD-10-CM | POA: Insufficient documentation

## 2021-01-08 DIAGNOSIS — R2689 Other abnormalities of gait and mobility: Secondary | ICD-10-CM | POA: Diagnosis present

## 2021-01-08 NOTE — Therapy (Signed)
[facility] [address] [place], [place], [address] Phone: [phone]   Fax:  [phone]  Physical Therapy Treatment  Patient Details  Name: Kelly [name] MRN: [id number] Date of Birth: 01/10/1986 Referring Provider (PT): Raliegh Ip, DO   Encounter Date: 01/08/2021   PT End of Session - 01/08/21 1347    Visit Number 9    Number of Visits 16    Date for PT Re-Evaluation 01/16/21    Authorization Type [employer], no auth required, no VL    Progress Note Due on Visit 10    PT Start Time 1345    PT Stop Time 1430    PT Time Calculation (min) 45 min    Equipment Utilized During Treatment Gait belt    Activity Tolerance Patient limited by fatigue    Behavior During Therapy [facility] for tasks assessed/performed           Past Medical History:  Diagnosis Date  . Headache   . Missed abortion 05/2015   no surgery required  . PONV (postoperative nausea and vomiting)     Past Surgical History:  Procedure Laterality Date  . CESAREAN SECTION N/A 09/12/2016   Procedure: CESAREAN SECTION;  Surgeon: Zelphia Cairo, MD;  Location: [facility] BIRTHING SUITES;  Service: Obstetrics;  Laterality: N/A;  RNFA  . CESAREAN SECTION N/A 01/11/2019   Procedure: CESAREAN SECTION;  Surgeon: Zelphia Cairo, MD;  Location: [facility] BIRTHING SUITES;  Service: Obstetrics;  Laterality: N/A;  Repeat edc 01/15/19 NKDA [name] RNFA  . DILATION AND EVACUATION N/A 12/23/2017   Procedure: DILATATION AND EVACUATION with Intraoperative Ultrasound ;  Surgeon: Zelphia Cairo, MD;  Location: [facility];  Service: Gynecology;  Laterality: N/A;  . WISDOM TOOTH EXTRACTION      Vitals:   01/08/21 1355  BP: 108/63  Pulse: 87     Subjective Assessment - 01/08/21 1346    Subjective Patient reports set back in her function and activity tolerance due to recent hx of COVID and not being able to be as active as she would like to be. Pt reports feeling weaker in her  RLE as a result of recent illness                             [facility] Adult PT Treatment/Exercise - 01/08/21 0001      Knee/Hip Exercises: Standing   SLS single leg lift-offs from 8" step 2x10 for single limb support    SLS with Vectors exagerrated march holding 8 lbs dumbells 3x5 for single limb support    Other Standing Knee Exercises stair taps with 8" step and 5 lbs ankle weights 2 x 2 min    Other Standing Knee Exercises sidestepping with 5 lbs ankle weights 2x2 min      Knee/Hip Exercises: Seated   Long Arc Quad Strengthening;Right;10 reps;5 sets    [employer] Weight 5 lbs.                  PT Education - 01/08/21 1502    Education Details pt advised on scaling activities to improve activity tolerance    Person(s) Educated Patient    Methods Explanation    Comprehension Verbalized understanding            PT Short Term Goals - 11/21/20 1546      PT SHORT TERM GOAL #1   Title Patient will increase RLE strength to  5/5 to promote ambulation without AD    Baseline 3+/5 to 4/5 RLE    Time 4    Period Weeks    Status New    Target Date 12/19/20      PT SHORT TERM GOAL #2   Title Patient will decrease risk for falls as evidenced by time of 13 seconds for 5 Times Sit to Stand test    Baseline 15.75 sec    Time 4    Period Weeks    Status New    Target Date 12/19/20      PT SHORT TERM GOAL #3   Title Patient will ambulate 500 ft without AD and normalized gait pattern during    Baseline 256 ft with CGA    Time 4    Period Weeks    Status New    Target Date 12/19/20             PT Long Term Goals - 11/21/20 1548      PT LONG TERM GOAL #1   Title Patient will be able to ambulate independently with normalized gait pattern x 600 ft during    Baseline 256 ft w/ CGA    Time 8    Period Weeks    Status New    Target Date 01/16/21      PT LONG TERM GOAL #2   Title Patient will demonstrate low risk for falls per score of  22/24 Dynamic Gait Index    Baseline DNT    Time 8    Period Weeks    Status New    Target Date 01/16/21      PT LONG TERM GOAL #3   Title Patient will be independent in complex HEP to improve self-efficacy    Baseline in development    Time 8    Period Weeks    Status New    Target Date 01/16/21                 Plan - 01/08/21 1404    Clinical Impression Statement Patient returns to PT sessions after recent hiatus due to acute illness and demonstrates exacerbation of RLE weakness and limited activity tolerance requiring more frequent therapeutic rest periods.  Vital signs monitored throughout and reveal values WNL.    Personal Factors and Comorbidities Comorbidity 1    Comorbidities cryptogenic nature of stroke, pregnancy    Examination-Activity Limitations Bathing;Bend;Caring for Others;Carry;Hygiene/Grooming;Locomotion Level;Reach Overhead;Squat;Stairs;Stand;Toileting;Transfers    Examination-Participation Restrictions Cleaning;Community Activity;Driving;Laundry;Meal Prep;Occupation;Yard Work;Shop    Stability/Clinical Decision Making Evolving/Moderate complexity    Rehab Potential Good    PT Frequency 2x / week    PT Duration 8 weeks    PT Treatment/Interventions ADLs/Self Care Home Management;Aquatic Therapy;Biofeedback;Cryotherapy;Moist Heat;Traction;Balance training;Therapeutic exercise;Therapeutic activities;Functional mobility training;Stair training;Gait training;DME Instruction;Neuromuscular re-education;Patient/family education;Orthotic Fit/Training;Manual techniques;Vestibular;Taping;Splinting;Energy conservation;Passive range of motion;Joint Manipulations    PT Next Visit Plan Continue with gait training using least restrictive AD, gross motor coordination RLE, tone integration techniques, gait cycle phases    PT Home Exercise Plan Gait training with cane, Sit to stands with emphasis on LE symmetry and eccentric lowering. 12/28: backwards walking, side stepping,  runner's step up. 1/10 single leg balance at kitchen sink with left foot in base cabinet, ankle dorsiflexion    Consulted and Agree with Plan of Care Patient           Patient will benefit from skilled therapeutic intervention in order to improve the following deficits and impairments:  Abnormal gait,Decreased activity  tolerance,Decreased balance,Decreased coordination,Decreased mobility,Decreased knowledge of use of DME,Decreased knowledge of precautions,Decreased endurance,Decreased strength,Difficulty walking,Impaired perceived functional ability,Impaired tone,Impaired UE functional use,Improper body mechanics  Visit Diagnosis: Other lack of coordination  Other symptoms and signs involving the musculoskeletal system  Muscle weakness (generalized)  Difficulty in walking, not elsewhere classified  Other abnormalities of gait and mobility     Problem List Patient Active Problem List   Diagnosis Date Noted  . Previous cesarean section 01/11/2019  . S/P cesarean section 01/11/2019  . Gestational diabetes 09/12/2016    3:04 PM, 01/08/21 M. Shary Decamp, PT, DPT Physical Therapist- [place] Office Number: [phone]  [facility] [address] [place], [place], [address] Phone: [phone]   Fax:  [phone]  Name: Kelly [name] MRN: [id number] Date of Birth: 1986-09-23

## 2021-01-08 NOTE — Therapy (Signed)
[place], [place], [address] Phone: [phone]   Fax:  [phone]  Occupational Therapy Treatment  Patient Details  Name: Kelly [name] MRN: [id number] Date of Birth: September 08, 1986 Referring Provider (OT): Ronnie Doss, DO   Encounter Date: 01/08/2021   OT End of Session - 01/08/21 1342    Visit Number 9    Number of Visits 12    Date for OT Re-Evaluation 01/16/21    Authorization Type BSBC    Authorization Time Period [employer] 12/02/20-12/01/21 no copay no visit limit    OT Start Time 1300    OT Stop Time 1341    OT Time Calculation (min) 41 min    Activity Tolerance Patient tolerated treatment well    Behavior During Therapy [facility] for tasks assessed/performed           Past Medical History:  Diagnosis Date  . Headache   . Missed abortion 05/2015   no surgery required  . PONV (postoperative nausea and vomiting)     Past Surgical History:  Procedure Laterality Date  . CESAREAN SECTION N/A 09/12/2016   Procedure: CESAREAN SECTION;  Surgeon: Marylynn Pearson, MD;  Location: [place];  Service: Obstetrics;  Laterality: N/A;  RNFA  . CESAREAN SECTION N/A 01/11/2019   Procedure: CESAREAN SECTION;  Surgeon: Marylynn Pearson, MD;  Location: [place];  Service: Obstetrics;  Laterality: N/A;  Repeat edc 01/15/19 NKDA [name] RNFA  . DILATION AND EVACUATION N/A 12/23/2017   Procedure: DILATATION AND EVACUATION with Intraoperative Ultrasound ;  Surgeon: Marylynn Pearson, MD;  Location: [facility];  Service: Gynecology;  Laterality: N/A;  . WISDOM TOOTH EXTRACTION      There were no vitals filed for this visit.   Subjective Assessment - 01/08/21 1309    Subjective  S: I know I weaker than I was 2 weeks ago. I found out that I had gestational diabetes and COVID on the same day.    Currently in Pain? No/denies              [facility] OT Assessment - 01/08/21 1312      Assessment   Medical  Diagnosis CVA right side weakness      Precautions   Precautions Other (comment)    Precaution Comments [redacted] weeks pregnant on 01/08/21                    OT Treatments/Exercises (OP) - 01/08/21 1313      ADLs   Eating Focused on motor control of the RUE with use of playdoh. Knife and fork used together to cut roll of Playdoh. Knife was placed through fork prongs to cut (this is baseline technique). Used fork in left hand to stab cut pieces of playdoh to place in bowl. Also complete scooping activity using spoon to scoop playdoh pieces from bowl to place in container.      Exercises   Exercises Hand      Additional Elbow Exercises   Sponges 10, 26      Neurological Re-education Exercises   Other Exercises 1 proximal shoulder strengthening task completed while using blue resistive clothespin and a 3 point pinch to transfer 12 out of 20 sponges from elevated surface down to tabletop while seated. Transitioned down to green resistive clothespin to transfer remaining pins.      Functional Reaching Activities   High Level Functional reaching task completed using 20 high resistance sponges. Placed 10 sponges initially  on elevated surface (2 stacked containers). Then surface was elevated again (3 stacked containers) to place remaining 10 sponges.                  OT Education - 01/08/21 1543    Education Details Provided foam piece to use with spoon at home and practice self feeding.    Person(s) Educated Patient    Methods Explanation    Comprehension Verbalized understanding            OT Short Term Goals - 01/08/21 1600      OT SHORT TERM GOAL #1   Title Patient will be educated and independent with HEP in order to faciliate her progress in therapy and allow her to return to using her right UE as her dominant for 75% or more of the time.    Time 4    Period Weeks    Status On-going    Target Date 12/19/20      OT SHORT TERM GOAL #2   Title Patient increase her  right grip strength by 10# and pinch strength by 5# in order to manipulate and open (not tight) jars or containers with less difficulty.    Time 4    Period Weeks      OT SHORT TERM GOAL #3   Title Patient will demonstrate an increase with right hand coordination by completing the 9 hole peg test in 50" or less allowing her to complet handwriting tasks with moderate difficulty and fatigue.    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    Time 8    Period Weeks    Status On-going      OT LONG TERM GOAL #2   Title Patient will increase her right hand grip strength by 25# and pinch strength by 10# in order to change diapers and open containers/bottles with no difficulty.    Time 8    Period Weeks    Status Partially Met      OT LONG TERM GOAL #3   Title Patient will demonstrate an increase in coordination by completing the 9 hole peg test in 30" or less in order to progress her handwriting legibility to as close to baseline as possible.    Time 8    Period Weeks    Status On-going      OT LONG TERM GOAL #4   Title Patient will demonstrate improved gross motor coordination while using her right hand as her dominant extremity during self feeding activities 100% of the time.    Time 8    Period Weeks    Status On-going                 Plan - 01/08/21 1552    Clinical Impression Statement A: Progressed patient as able during coordination and proximal shoulder stability. Reviewed self feeding technique and discussed picking one meal a day if able and use her right hand to self feed. Provided foam piece to add to utensil for assistance on grasp if needed. Pt demonstrates more stability and increased motor control when holding her utensil in between her index and middle finger.  Muscle fatigue noted  with reaching tasks due to decrease shoulder and scapular stability. VC for form and technique were provided throughout session.    Body Structure / Function / Physical Skills ADL;Endurance;UE functional use;FMC;GMC;Coordination;Sensation;IADL;Strength    Plan P: Follow up on self feeding practice and use of built up foam piece on utensil. Work on scapular and shoulder stability/proximal strengthening. Ideas: wall wash, proximal shoulder strengthening flexion on door with washcloth start at 30" if possible. cones on shelf. pinch tree standing using vertical pole.    Consulted and Agree with Plan of Care Patient           Patient will benefit from skilled therapeutic intervention in order to improve the following deficits and impairments:   Body Structure / Function / Physical Skills: ADL,Endurance,UE functional use,FMC,GMC,Coordination,Sensation,IADL,Strength       Visit Diagnosis: Other lack of coordination  Other symptoms and signs involving the musculoskeletal system    Problem List Patient Active Problem List   Diagnosis Date Noted  . Previous cesarean section 01/11/2019  . S/P cesarean section 01/11/2019  . Gestational diabetes 09/12/2016    Ailene Ravel, OTR/L,CBIS  [phone]  01/08/2021, 4:00 PM  [place] [address] [place], [place], [address] Phone: [phone]   Fax:  [phone]  Name: Kelly [name] MRN: [id number] Date of Birth: October 13, 1986

## 2021-01-10 ENCOUNTER — Encounter (HOSPITAL_COMMUNITY): Payer: Self-pay

## 2021-01-10 ENCOUNTER — Ambulatory Visit (HOSPITAL_COMMUNITY): Payer: BC Managed Care – PPO

## 2021-01-10 ENCOUNTER — Other Ambulatory Visit: Payer: Self-pay

## 2021-01-10 DIAGNOSIS — R29898 Other symptoms and signs involving the musculoskeletal system: Secondary | ICD-10-CM

## 2021-01-10 DIAGNOSIS — R278 Other lack of coordination: Secondary | ICD-10-CM | POA: Diagnosis not present

## 2021-01-10 NOTE — Patient Instructions (Signed)
Complete the following exercises 2-3 times a day or as often as you need to.     Wall Flexion  Slide your arm up the wall or door frame until a stretch is felt in your shoulder . Hold for 20-30 seconds. Complete 2 times     Shoulder Abduction Stretch  Stand side ways by a wall with affected up on wall. Gently step in toward wall to feel stretch. Hold for 20-30 seconds. Complete 2 times.

## 2021-01-11 NOTE — Therapy (Signed)
[place], [place], [address] Phone: [phone]   Fax:  [phone]  Occupational Therapy Treatment  Patient Details  Name: Kelly [name] MRN: [id number] Date of Birth: 10-Nov-1986 Referring Provider (OT): Ronnie Doss, DO   Encounter Date: 01/10/2021   OT End of Session - 01/11/21 1719    Visit Number 10    Number of Visits 12    Date for OT Re-Evaluation 01/16/21    Authorization Type BSBC    Authorization Time Period [employer] 12/02/20-12/01/21 no copay no visit limit    OT Start Time [place]   therapist emergency. short session   OT Stop Time 1540    OT Time Calculation (min) 24 min    Activity Tolerance Patient tolerated treatment well    Behavior During Therapy [facility] for tasks assessed/performed           Past Medical History:  Diagnosis Date  . Headache   . Missed abortion 05/2015   no surgery required  . PONV (postoperative nausea and vomiting)     Past Surgical History:  Procedure Laterality Date  . CESAREAN SECTION N/A 09/12/2016   Procedure: CESAREAN SECTION;  Surgeon: Marylynn Pearson, MD;  Location: [place];  Service: Obstetrics;  Laterality: N/A;  RNFA  . CESAREAN SECTION N/A 01/11/2019   Procedure: CESAREAN SECTION;  Surgeon: Marylynn Pearson, MD;  Location: [place];  Service: Obstetrics;  Laterality: N/A;  Repeat edc 01/15/19 NKDA [name] RNFA  . DILATION AND EVACUATION N/A 12/23/2017   Procedure: DILATATION AND EVACUATION with Intraoperative Ultrasound ;  Surgeon: Marylynn Pearson, MD;  Location: [place];  Service: Gynecology;  Laterality: N/A;  . WISDOM TOOTH EXTRACTION      There were no vitals filed for this visit.   Subjective Assessment - 01/10/21 1527    Subjective  S: I feel a little better.    Currently in Pain? No/denies              [facility] OT Assessment - 01/11/21 1732      Assessment   Medical Diagnosis CVA right side weakness       Precautions   Precautions Other (comment)    Precaution Comments [redacted] weeks pregnant on 01/08/21                    OT Treatments/Exercises (OP) - 01/10/21 1528      Exercises   Exercises Shoulder      Shoulder Exercises: ROM/Strengthening   UBE (Upper Arm Bike) Level 1 2' reverse 2' reverse    Over Head Lace completed sitting. Pt laced from top down then unlaced      Shoulder Exercises: Stretch   Wall Stretch - Flexion 2 reps;30 seconds   doorway   Wall Stretch - ABduction 2 reps;30 seconds   door                 OT Education - 01/11/21 1718    Education Details shoulder stretches: flexion and abduction    Person(s) Educated Patient    Methods Explanation;Demonstration;Verbal cues;Handout    Comprehension Returned demonstration;Verbalized understanding            OT Short Term Goals - 01/08/21 1600      OT SHORT TERM GOAL #1   Title Patient will be educated and independent with HEP in order to faciliate her progress in therapy and allow her to return to using her right UE as her dominant for  75% or more of the time.    Time 4    Period Weeks    Status On-going    Target Date 12/19/20      OT SHORT TERM GOAL #2   Title Patient increase her right grip strength by 10# and pinch strength by 5# in order to manipulate and open (not tight) jars or containers with less difficulty.    Time 4    Period Weeks      OT SHORT TERM GOAL #3   Title Patient will demonstrate an increase with right hand coordination by completing the 9 hole peg test in 50" or less allowing her to complet handwriting tasks with moderate difficulty and fatigue.    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    Time 8    Period Weeks    Status On-going      OT LONG TERM GOAL #2    Title Patient will increase her right hand grip strength by 25# and pinch strength by 10# in order to change diapers and open containers/bottles with no difficulty.    Time 8    Period Weeks    Status Partially Met      OT LONG TERM GOAL #3   Title Patient will demonstrate an increase in coordination by completing the 9 hole peg test in 30" or less in order to progress her handwriting legibility to as close to baseline as possible.    Time 8    Period Weeks    Status On-going      OT LONG TERM GOAL #4   Title Patient will demonstrate improved gross motor coordination while using her right hand as her dominant extremity during self feeding activities 100% of the time.    Time 8    Period Weeks    Status On-going                 Plan - 01/11/21 1721    Clinical Impression Statement A: Pt reports increased stiffness in shoulder at start of session. Provided shoulder flexion and abduction stretch for HEP to address shoulder stiffness at home. VC for technique and form were provided.    Occupational performance deficits (Please refer to evaluation for details): ADL's;IADL's;Work;Leisure;Social Participation;Play    Body Structure / Function / Physical Skills ADL;Endurance;UE functional use;FMC;GMC;Coordination;Sensation;IADL;Strength    Plan P: Reassessment/re-cert. Follow up on shoulder stretches provided at last session. Continue to focus on scapular stability/proximal strengthening: wall wash, proximal shoulder strengthening flexion on door with washcloth starting with 30" and increase time if possible. [place] on pinch tree standing using vertical pole.           Patient will benefit from skilled therapeutic intervention in order to improve the following deficits and impairments:   Body Structure / Function / Physical Skills: ADL,Endurance,UE functional use,FMC,GMC,Coordination,Sensation,IADL,Strength       Visit Diagnosis: Other lack of coordination  Other symptoms and  signs involving the musculoskeletal system    Problem List Patient Active Problem List   Diagnosis Date Noted  . Previous cesarean section 01/11/2019  . S/P cesarean section 01/11/2019  . Gestational diabetes 09/12/2016    Ailene Ravel, OTR/L,CBIS  [phone]  01/11/2021, 5:32 PM  [facility] [address] [place], [place], [address] Phone: [phone]   Fax:  [phone]  Name: Kelly [name] MRN: [id number] Date of Birth: 1986/06/09

## 2021-01-12 ENCOUNTER — Encounter (HOSPITAL_COMMUNITY): Payer: BC Managed Care – PPO | Admitting: Specialist

## 2021-01-16 ENCOUNTER — Encounter: Payer: Self-pay | Admitting: Family Medicine

## 2021-01-16 ENCOUNTER — Ambulatory Visit (INDEPENDENT_AMBULATORY_CARE_PROVIDER_SITE_OTHER): Payer: BC Managed Care – PPO | Admitting: Family Medicine

## 2021-01-16 ENCOUNTER — Ambulatory Visit (HOSPITAL_COMMUNITY): Payer: BC Managed Care – PPO | Admitting: Physical Therapy

## 2021-01-16 ENCOUNTER — Ambulatory Visit (HOSPITAL_COMMUNITY): Payer: BC Managed Care – PPO | Admitting: Occupational Therapy

## 2021-01-16 DIAGNOSIS — R0989 Other specified symptoms and signs involving the circulatory and respiratory systems: Secondary | ICD-10-CM | POA: Diagnosis not present

## 2021-01-16 DIAGNOSIS — H938X3 Other specified disorders of ear, bilateral: Secondary | ICD-10-CM

## 2021-01-16 NOTE — Progress Notes (Signed)
   Virtual Visit via telephone Note Due to COVID-19 pandemic this visit was conducted virtually. This visit type was conducted due to national recommendations for restrictions regarding the COVID-19 Pandemic (e.g. social distancing, sheltering in place) in an effort to limit this patient's exposure and mitigate transmission in our community. All issues noted in this document were discussed and addressed.  A physical exam was not performed with this format.  I connected with Kelly [name] on 01/16/21 at 1113 by telephone and verified that I am speaking with the correct person using two identifiers. Kelly [name] is currently located at home and her son is currently with her during the visit. The provider, Gabriel Earing, [name] is located in their office at time of visit.  I discussed the limitations, risks, security and privacy concerns of performing an evaluation and management service by telephone and the availability of in person appointments. I also discussed with the patient that there may be a patient responsible charge related to this service. The patient expressed understanding and agreed to proceed.  CC: sinus problem  History and Present Illness:  HPI  Kelly [name] was treated for a sinus infection with Omnicef 2 weeks ago. She completed the antibiotic 4 days ago. She reports that she feels better overall and her headaches and facial pressure have resolved. She does still have congestion and she feels ear fullness. She coughed up some yellow mucus this morning. She denies fever, shortness of breath, or chest pain. She has not taken anything for her symptoms for the last few days. She is currently [redacted] weeks pregnant.   ROS As per HPI.   Observations/Objective: Alert and oriented x 3. Able to speak in full sentences without difficulty.   Assessment and Plan: Kelly [name] was seen today for sinus problem.  Diagnoses and all orders for this visit:  Congestion of both  ears Improving symptoms over all. Kelly [name] has flonase at home that she will restart. Use 2 sprays in each nostril daily. Return to office for new or worsening symptoms, or if symptoms persist.   Chest congestion Take plan Mucinex for congestion. Return to office for new or worsening symptoms, or if symptoms persist.   Follow Up Instructions: Return to office for new or worsening symptoms, or if symptoms persist.    I discussed the assessment and treatment plan with the patient. The patient was provided an opportunity to ask questions and all were answered. The patient agreed with the plan and demonstrated an understanding of the instructions.   The patient was advised to call back or seek an in-person evaluation if the symptoms worsen or if the condition fails to improve as anticipated.  The above assessment and management plan was discussed with the patient. The patient verbalized understanding of and has agreed to the management plan. Patient is aware to call the clinic if symptoms persist or worsen. Patient is aware when to return to the clinic for a follow-up visit. Patient educated on when it is appropriate to go to the emergency department.   Time call ended:  1120  I provided 7 minutes of phone time during this encounter.    Gabriel Earing, [name]

## 2021-01-18 ENCOUNTER — Ambulatory Visit (HOSPITAL_COMMUNITY): Payer: BC Managed Care – PPO | Admitting: Physical Therapy

## 2021-01-18 ENCOUNTER — Encounter (HOSPITAL_COMMUNITY): Payer: Self-pay | Admitting: Occupational Therapy

## 2021-01-18 ENCOUNTER — Ambulatory Visit (HOSPITAL_COMMUNITY): Payer: BC Managed Care – PPO | Admitting: Occupational Therapy

## 2021-01-18 ENCOUNTER — Other Ambulatory Visit: Payer: Self-pay

## 2021-01-18 ENCOUNTER — Encounter (HOSPITAL_COMMUNITY): Payer: Self-pay | Admitting: Physical Therapy

## 2021-01-18 DIAGNOSIS — R262 Difficulty in walking, not elsewhere classified: Secondary | ICD-10-CM

## 2021-01-18 DIAGNOSIS — R2689 Other abnormalities of gait and mobility: Secondary | ICD-10-CM

## 2021-01-18 DIAGNOSIS — R29898 Other symptoms and signs involving the musculoskeletal system: Secondary | ICD-10-CM

## 2021-01-18 DIAGNOSIS — R278 Other lack of coordination: Secondary | ICD-10-CM

## 2021-01-18 DIAGNOSIS — M6281 Muscle weakness (generalized): Secondary | ICD-10-CM

## 2021-01-18 NOTE — Therapy (Signed)
[facility] [address] [place], [place], [address] Phone: [phone]   Fax:  [phone]  Physical Therapy Treatment  Patient Details  Name: Kelly [name] MRN: [id number] Date of Birth: [birth year] Referring Provider (PT): Janora Norlander, DO  PHYSICAL THERAPY DISCHARGE SUMMARY  Visits from Start of Care: 10  Current functional level related to goals / functional outcomes: See below   Remaining deficits: See below     Education / Equipment: See assessment   Plan: Patient agrees to discharge.  Patient goals were partially met. Patient is being discharged due to being pleased with the current functional level.  ?????      Encounter Date: 01/18/2021   PT End of Session - 01/18/21 0824    Visit Number 10    Number of Visits 16    Date for PT Re-Evaluation 01/16/21    Authorization Type [place], no auth required, no VL    Progress Note Due on Visit 10    PT Start Time [phone]    PT Stop Time 0900    PT Time Calculation (min) 44 min    Equipment Utilized During Treatment Gait belt    Activity Tolerance Patient tolerated treatment well    Behavior During Therapy WFL for tasks assessed/performed           Past Medical History:  Diagnosis Date  . Headache   . Missed abortion 05/2015   no surgery required  . PONV (postoperative nausea and vomiting)     Past Surgical History:  Procedure Laterality Date  . CESAREAN SECTION N/A 09/12/2016   Procedure: CESAREAN SECTION;  Surgeon: Marylynn Pearson, MD;  Location: [facility];  Service: Obstetrics;  Laterality: N/A;  RNFA  . CESAREAN SECTION N/A 01/11/2019   Procedure: CESAREAN SECTION;  Surgeon: Marylynn Pearson, MD;  Location: [facility];  Service: Obstetrics;  Laterality: N/A;  Repeat edc 01/15/19 NKDA [name] RNFA  . DILATION AND EVACUATION N/A 12/23/2017   Procedure: DILATATION AND EVACUATION with Intraoperative Ultrasound ;  Surgeon:  Marylynn Pearson, MD;  Location: [facility];  Service: Gynecology;  Laterality: N/A;  . WISDOM TOOTH EXTRACTION      There were no vitals filed for this visit.   Subjective Assessment - 01/18/21 0824    Subjective Patient says she is feeling much better. She feels her leg his getting stronger, faster than her arm. She feels she is walking better. She feels balance is better and was able to get rid of shower chair and feels she can safely shower with her eyes closed. Patient reports 90% improvement since starting therapy.    Currently in Pain? No/denies              [facility] PT Assessment - 01/18/21 0826      Assessment   Medical Diagnosis CVA right side weakness    Referring Provider (PT) Janora Norlander, DO    Onset Date/Surgical Date 11/06/20      Precautions   Precautions Other (comment)    Precaution Comments [redacted] weeks pregnant on 01/18/21      Balance Screen   Has the patient fallen in the past 6 months No      [place] residence    Living Arrangements Spouse/significant other;Children      Prior Function   Level of Independence Independent      Cognition   Overall Cognitive Status Within Functional Limits for tasks assessed  Strength   Right Hip Flexion 4+/5    Right Hip Extension 4+/5    Right Hip ABduction 4+/5    Left Hip Flexion 5/5    Left Hip Extension 4+/5    Left Hip ABduction 4+/5    Right Knee Flexion 5/5    Right Knee Extension 5/5    Left Knee Flexion 5/5    Left Knee Extension 5/5    Right Ankle Dorsiflexion 5/5    Left Ankle Dorsiflexion 5/5      Transfers   Five time sit to stand comments  12.3 sec with no UE   was 15.75 sec     Ambulation/Gait   Ambulation/Gait Yes    Ambulation/Gait Assistance 7: Independent    Ambulation Distance (Feet) 460 Feet    Assistive device None    Gait Pattern Within Functional Limits    Ambulation Surface Level;Indoor    Gait Comments 2MWT      Standardized Balance  Assessment   Standardized Balance Assessment Dynamic Gait Index      Dynamic Gait Index   Level Surface Normal    Change in Gait Speed Normal    Gait with Horizontal Head Turns Normal    Gait with Vertical Head Turns Normal    Gait and Pivot Turn Normal    Step Over Obstacle Normal    Step Around Obstacles Normal    Steps Normal    Total Score 24                                 PT Education - 01/18/21 0824    Education Details on reassessment findings and POC    Person(s) Educated Patient    Methods Explanation    Comprehension Verbalized understanding            PT Short Term Goals - 01/18/21 0848      PT SHORT TERM GOAL #1   Title Patient will increase RLE strength to 5/5 to promote ambulation without AD    Baseline See MMT (4+ to 5 throughout)    Time 4    Period Weeks    Status Partially Met    Target Date 12/19/20      PT SHORT TERM GOAL #2   Title Patient will decrease risk for falls as evidenced by time of 13 seconds for 5 Times Sit to Stand test    Baseline Current: 12.3 sec    Time 4    Period Weeks    Status Achieved    Target Date 12/19/20      PT SHORT TERM GOAL #3   Title Patient will ambulate 500 ft without AD and normalized gait pattern during 2MWT    Baseline current: 460 with no AD    Time 4    Period Weeks    Status Partially Met    Target Date 12/19/20             PT Long Term Goals - 01/18/21 0850      PT LONG TERM GOAL #1   Title Patient will be able to ambulate independently with normalized gait pattern x 600 ft during 2MWT    Baseline Current: 460 with no AD    Time 8    Period Weeks    Status Partially Met      PT LONG TERM GOAL #2   Title Patient will demonstrate low risk for falls per score  of 22/24 Dynamic Gait Index    Baseline 24/24    Time 8    Period Weeks    Status Achieved      PT LONG TERM GOAL #3   Title Patient will be independent in complex HEP to improve self-efficacy    Baseline  Reviewed, reports compliance, all questions answered    Time 8    Period Weeks    Status Achieved                 Plan - 01/18/21 6060    Clinical Impression Statement Reassessment performed today. Patient has made very good progress and has partially met all long-term goals. Patient shows significantly improved LE strength and balance, much better gait mechanics and safety awareness. Ambulation distance goal not fully met, but current presentation is well within functional limits especially with patient being [redacted] weeks pregnant. At this time patient will be DC from physical therapy to transition to HEP with all goals met/ partially met. Reviewed HEP and issued updated handout. Answered all patient questions. Patient encouraged to follow up with physical therapy services with any further questions or concerns.    Personal Factors and Comorbidities Comorbidity 1    Comorbidities cryptogenic nature of stroke, pregnancy    Examination-Activity Limitations Bathing;Bend;Caring for Others;Carry;Hygiene/Grooming;Locomotion Level;Reach Overhead;Squat;Stairs;Stand;Toileting;Transfers    Examination-Participation Restrictions Cleaning;Community Activity;Driving;Laundry;Meal Prep;Occupation;Yard Work;Shop    Stability/Clinical Decision Making Evolving/Moderate complexity    Rehab Potential Good    PT Frequency 2x / week    PT Duration 8 weeks    PT Treatment/Interventions ADLs/Self Care Home Management;Aquatic Therapy;Biofeedback;Cryotherapy;Moist Heat;Traction;Balance training;Therapeutic exercise;Therapeutic activities;Functional mobility training;Stair training;Gait training;DME Instruction;Neuromuscular re-education;Patient/family education;Orthotic Fit/Training;Manual techniques;Vestibular;Taping;Splinting;Energy conservation;Passive range of motion;Joint Manipulations    PT Next Visit Plan DC to HEP    PT Home Exercise Plan Gait training with cane, Sit to stands with emphasis on LE symmetry and  eccentric lowering. 12/28: backwards walking, side stepping, runner's step up. 1/10 single leg balance at kitchen sink with left foot in base cabinet, ankle dorsiflexion    Consulted and Agree with Plan of Care Patient           Patient will benefit from skilled therapeutic intervention in order to improve the following deficits and impairments:  Abnormal gait,Decreased activity tolerance,Decreased balance,Decreased coordination,Decreased mobility,Decreased knowledge of use of DME,Decreased knowledge of precautions,Decreased endurance,Decreased strength,Difficulty walking,Impaired perceived functional ability,Impaired tone,Impaired UE functional use,Improper body mechanics  Visit Diagnosis: Other lack of coordination  Other symptoms and signs involving the musculoskeletal system  Muscle weakness (generalized)  Difficulty in walking, not elsewhere classified  Other abnormalities of gait and mobility     Problem List Patient Active Problem List   Diagnosis Date Noted  . Previous cesarean section 01/11/2019  . S/P cesarean section 01/11/2019  . Gestational diabetes 09/12/2016   10:24 AM, 01/18/21 Josue Hector PT DPT  Physical Therapist with [facility]  [phone] [place] [address] [place], [place], [address] Phone: [phone]   Fax:  [phone]  Name: Kelly [name] MRN: [id number] Date of Birth: 20-May-1986

## 2021-01-18 NOTE — Therapy (Signed)
[facility] [place], [place], [address] Phone: [phone]   Fax:  [phone]  Occupational Therapy Reassessment, Treatment, Recertification  Patient Details  Name: Kelly [name] MRN: [id number] Date of Birth: 01-03-1986 Referring Provider (OT): Ronnie Doss, DO   Encounter Date: 01/18/2021   OT End of Session - 01/18/21 0813    Visit Number 11    Number of Visits 19    Date for OT Re-Evaluation 02/17/2021   Authorization Type [employer]    Authorization Time Period [employer] 12/02/20-12/01/21 no copay no visit limit    OT Start Time 0735    OT Stop Time 0815    OT Time Calculation (min) 40 min    Activity Tolerance Patient tolerated treatment well    Behavior During Therapy [facility] for tasks assessed/performed           Past Medical History:  Diagnosis Date  . Headache   . Missed abortion 05/2015   no surgery required  . PONV (postoperative nausea and vomiting)     Past Surgical History:  Procedure Laterality Date  . CESAREAN SECTION N/A 09/12/2016   Procedure: CESAREAN SECTION;  Surgeon: Marylynn Pearson, MD;  Location: [facility];  Service: Obstetrics;  Laterality: N/A;  RNFA  . CESAREAN SECTION N/A 01/11/2019   Procedure: CESAREAN SECTION;  Surgeon: Marylynn Pearson, MD;  Location: [facility];  Service: Obstetrics;  Laterality: N/A;  Repeat edc 01/15/19 NKDA [name] RNFA  . DILATION AND EVACUATION N/A 12/23/2017   Procedure: DILATATION AND EVACUATION with Intraoperative Ultrasound ;  Surgeon: Marylynn Pearson, MD;  Location: [facility];  Service: Gynecology;  Laterality: N/A;  . WISDOM TOOTH EXTRACTION      There were no vitals filed for this visit.   Subjective Assessment - 01/18/21 0737    Subjective  S: It might be weaker since I had covid and now have gestational diabetes.    Currently in Pain? No/denies              [facility] OT Assessment - 01/18/21 0737      Assessment   Medical  Diagnosis CVA right side weakness      Precautions   Precautions Other (comment)    Precaution Comments [redacted] weeks pregnant on 01/18/21      Coordination   Right 9 Hole Peg Test 33.80"   37.64" previous     Strength   Right Shoulder Flexion 4+/5   same as previous   Right Shoulder ABduction 5/5   4+/5 previous   Right Shoulder Internal Rotation 5/5   4+/5 previous   Right Shoulder External Rotation 4+/5   4-/5 previous   Right Elbow Extension 5/5   4/5 previous   Right Forearm Pronation 5/5   4+/5 previous   Right Forearm Supination 5/5   4-/5 previous   Right Hand Grip (lbs) 55   65 previous   Right Hand Lateral Pinch 15 lbs   same as previous   Right Hand 3 Point Pinch 18 lbs   same as previous                   OT Treatments/Exercises (OP) - 01/18/21 2409      Exercises   Exercises Shoulder      Neurological Re-education Exercises   Hand Gripper with Large Beads all beads gripper at 42#, vertical    Hand Gripper with Medium Beads all beads gripper at 42#, vertical    Hand Gripper with  Small Beads all beads gripper at 42#, horizontal      Fine Motor Coordination (Hand/Wrist)   Fine Motor Coordination Flipping cards;Large Pegboard    Large Pegboard Pt provided with 5 large pegs stacked together, using thumb and index finger in lateral pinch to pull apart. Completed 3 stacks of 5.    Flipping cards Pt holding index card in right hand, rotating 2x and flipping 2x, completing 10X. Mod difficulty with rotating and increased time to rotate with all fingers versus only index finger. Occasionally dropping cards with flipping. Pt then completing card shuffle 5x, mod difficulty lining stacks up initially, increased time to follow through with complete shuffle.                    OT Short Term Goals - 01/18/21 0746      OT SHORT TERM GOAL #1   Title Patient will be educated and independent with HEP in order to faciliate her progress in therapy and allow her to  return to using her right UE as her dominant for 75% or more of the time.    Time 4    Period Weeks    Status On-going    Target Date 12/19/20      OT SHORT TERM GOAL #2   Title Patient increase her right grip strength by 10# and pinch strength by 5# in order to manipulate and open (not tight) jars or containers with less difficulty.    Time 4    Period Weeks      OT SHORT TERM GOAL #3   Title Patient will demonstrate an increase with right hand coordination by completing the 9 hole peg test in 50" or less allowing her to complet handwriting tasks with moderate difficulty and fatigue.    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    Time 8    Period Weeks    Status Partially Met      OT LONG TERM GOAL #2   Title Patient will increase her right hand grip strength to 70# and pinch strength to 18# (lateral) and 20# (3 point) in order to change diapers and open containers/bottles with no difficulty.    Time 8    Period Weeks    Status Revised      OT LONG TERM GOAL #3   Title Patient will demonstrate an increase in coordination by completing the 9 hole peg test in 30" or less in order to progress her handwriting legibility to as close to baseline as possible.    Time 8    Period Weeks    Status On-going      OT LONG TERM GOAL #4   Title Patient will demonstrate improved gross motor coordination while using her right hand as her dominant extremity during self feeding activities 100% of the time.    Time 8    Period Weeks    Status Achieved                 Plan - 01/18/21 0754    Clinical Impression Statement A: Reassessment completed this session, pt has met 2/3 STGs and 2/4 LTGs with one additional LTG  partially met. Pt is making good progress with RUE strength,  grip strength has decreased from prior assessment, potentially due to pt not completing HEP or many functional tasks over the course of 2 weeks due to having covid and then being diagnosed with gestational diabetes. Pt reports she is feeling better and has been getting back into the habit of completing HEP. Pt demonstrates improved shoulder strength and stability, reports most weakness and coordination issues are in hand and digits now. Revised grip and pinch strength goals to reflect current functioning and provide challenge. Session focusing on grip and pinch, as well as fine motor coordination tasks. Added lateral pinch task and card rotate/flip task. Verbal cuing for form and technique.    OT Occupational Profile and History Detailed Assessment- Review of Records and additional review of physical, cognitive, psychosocial history related to current functional performance    Occupational performance deficits (Please refer to evaluation for details): ADL's;IADL's;Work;Leisure;Social Participation;Play    Body Structure / Function / Physical Skills ADL;Endurance;UE functional use;FMC;GMC;Coordination;Sensation;IADL;Strength    Rehab Potential Good    Clinical Decision Making Limited treatment options, no task modification necessary    Comorbidities Affecting Occupational Performance: None    Modification or Assistance to Complete Evaluation  No modification of tasks or assist necessary to complete eval    OT Frequency 2x / week    OT Duration 4 weeks    OT Treatment/Interventions Self-care/ADL training;Visual/perceptual remediation/compensation;Patient/family education;DME and/or AE instruction;Passive range of motion;Electrical Stimulation;Splinting;Paraffin;Therapeutic activities;Manual Therapy;Therapeutic exercise;Neuromuscular education    Plan P: Pt will continue to benefit from skilled OT services to improve RUE strength and functioning required for ADL and work tasks.Next session: DASH, proximal  shoulder strengthening with ball on wall, grip and pinch task.    OT Home Exercise Plan Eval: yellow theraputty - grip and pinch strength. Proximal shoulder strengthening and coordination tasks; 12/28: updated to red theraputty; 1/13: pencil control packet.    Consulted and Agree with Plan of Care Patient           Patient will benefit from skilled therapeutic intervention in order to improve the following deficits and impairments:   Body Structure / Function / Physical Skills: ADL,Endurance,UE functional use,FMC,GMC,Coordination,Sensation,IADL,Strength       Visit Diagnosis: Other lack of coordination  Other symptoms and signs involving the musculoskeletal system    Problem List Patient Active Problem List   Diagnosis Date Noted  . Previous cesarean section 01/11/2019  . S/P cesarean section 01/11/2019  . Gestational diabetes 09/12/2016    Guadelupe Sabin, OTR/L  [phone] 01/18/2021, 8:42 AM  [facility] [address] [place], [place], [address] Phone: [phone]   Fax:  [phone]  Name: Kelly [name] MRN: [id number] Date of Birth: 12-16-1985

## 2021-01-23 ENCOUNTER — Ambulatory Visit (HOSPITAL_COMMUNITY): Payer: BC Managed Care – PPO

## 2021-01-23 ENCOUNTER — Other Ambulatory Visit: Payer: Self-pay

## 2021-01-23 ENCOUNTER — Ambulatory Visit (HOSPITAL_COMMUNITY): Payer: BC Managed Care – PPO | Admitting: Physical Therapy

## 2021-01-23 ENCOUNTER — Encounter (HOSPITAL_COMMUNITY): Payer: Self-pay

## 2021-01-23 DIAGNOSIS — R29898 Other symptoms and signs involving the musculoskeletal system: Secondary | ICD-10-CM

## 2021-01-23 DIAGNOSIS — R278 Other lack of coordination: Secondary | ICD-10-CM | POA: Diagnosis not present

## 2021-01-23 NOTE — Therapy (Signed)
[facility] [address] [place], [place], [address] Phone: [phone]   Fax:  [phone]  Occupational Therapy Treatment  Patient Details  Name: Kelly [name] MRN: [id number] Date of Birth: 03-16-1986 Referring Provider (OT): Ronnie Doss, DO   Encounter Date: 01/23/2021   OT End of Session - 01/23/21 1802    Visit Number 12    Number of Visits 19    Date for OT Re-Evaluation 02/17/21    Authorization Type BSBC    Authorization Time Period BCBS 12/02/20-12/01/21 no copay no visit limit    OT Start Time 1350   pt arrived late   OT Stop Time 1425    OT Time Calculation (min) 35 min    Activity Tolerance Patient tolerated treatment well    Behavior During Therapy [facility] for tasks assessed/performed           Past Medical History:  Diagnosis Date  . Headache   . Missed abortion 05/2015   no surgery required  . PONV (postoperative nausea and vomiting)     Past Surgical History:  Procedure Laterality Date  . CESAREAN SECTION N/A 09/12/2016   Procedure: CESAREAN SECTION;  Surgeon: Marylynn Pearson, MD;  Location: [facility];  Service: Obstetrics;  Laterality: N/A;  RNFA  . CESAREAN SECTION N/A 01/11/2019   Procedure: CESAREAN SECTION;  Surgeon: Marylynn Pearson, MD;  Location: [facility];  Service: Obstetrics;  Laterality: N/A;  Repeat edc 01/15/19 NKDA [name] RNFA  . DILATION AND EVACUATION N/A 12/23/2017   Procedure: DILATATION AND EVACUATION with Intraoperative Ultrasound ;  Surgeon: Marylynn Pearson, MD;  Location: [facility];  Service: Gynecology;  Laterality: N/A;  . WISDOM TOOTH EXTRACTION      There were no vitals filed for this visit.   Subjective Assessment - 01/23/21 1402    Subjective  S: Nothing new to report.    Currently in Pain? No/denies              [facility] OT Assessment - 01/23/21 1403      Assessment   Medical Diagnosis CVA right side weakness      Precautions   Precautions  Other (comment)    Precaution Comments [redacted] weeks pregnant on 01/18/21              Katina Dung - 01/23/21 1404    Open a tight or new jar Moderate difficulty    Do heavy household chores (wash walls, wash floors) No difficulty    Carry a shopping bag or briefcase No difficulty    Wash your back No difficulty    Use a knife to cut food Severe difficulty   food prep cutting  ie.cherry tomato   Recreational activities in which you take some force or impact through your arm, shoulder, or hand (golf, hammering, tennis) Severe difficulty    During the past week, to what extent has your arm, shoulder or hand problem interfered with your normal social activities with family, friends, neighbors, or groups? Slightly    During the past week, to what extent has your arm, shoulder or hand problem limited your work or other regular daily activities Extremely    Arm, shoulder, or hand pain. None    Tingling (pins and needles) in your arm, shoulder, or hand Severe   just in hand   Difficulty Sleeping No difficulty    DASH Score 36.36 %  OT Treatments/Exercises (OP) - 01/23/21 1403      Exercises   Exercises Shoulder;Hand;Theraputty      Shoulder Exercises: ROM/Strengthening   Ball on Wall 1' flexion 1' abduction red ball      Hand Exercises   Other Hand Exercises Pt utilized tweezers while holding in right hand in functional tripod grasp while picking up 10 small beads and transferred into container    Other Hand Exercises Pt utilized pvc pipe to press circles into red putty focusing on grip strength, shoulder stability, and wrist flexion/extension and forearm strength      Theraputty   Theraputty - Flatten red - standing    Theraputty - Roll red    Theraputty - Grip red- pronated and supinated    Theraputty - Pinch red- 3 point pinch (circled created by PVC pipe)    Theraputty Hand- Locate Pegs 10/10 beads located - red      Fine Motor Coordination (Hand/Wrist)   Fine  Motor Coordination Manipulation of small objects                    OT Short Term Goals - 01/18/21 0746      OT SHORT TERM GOAL #1   Title Patient will be educated and independent with HEP in order to faciliate her progress in therapy and allow her to return to using her right UE as her dominant for 75% or more of the time.    Time 4    Period Weeks    Status On-going    Target Date 12/19/20      OT SHORT TERM GOAL #2   Title Patient increase her right grip strength by 10# and pinch strength by 5# in order to manipulate and open (not tight) jars or containers with less difficulty.    Time 4    Period Weeks      OT SHORT TERM GOAL #3   Title Patient will demonstrate an increase with right hand coordination by completing the 9 hole peg test in 50" or less allowing her to complet handwriting tasks with moderate difficulty and fatigue.    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    Time 8    Period Weeks    Status Partially Met      OT LONG TERM GOAL #2   Title Patient will increase her right hand grip strength to 70# and pinch strength to 18# (lateral) and 20# (3 point) in order to change diapers and open containers/bottles with no difficulty.    Time 8    Period Weeks    Status On-going      OT LONG TERM GOAL #3   Title Patient will demonstrate an increase in coordination by completing the 9 hole peg test in 30" or less in order to progress her handwriting legibility to as close to baseline as possible.    Time 8    Period Weeks    Status On-going      OT LONG TERM GOAL #4   Title Patient will demonstrate improved gross motor coordination while using her right hand as her dominant extremity during self feeding activities 100% of the  time.    Time 8    Period  Weeks                 Plan - 01/23/21 1803    Clinical Impression Statement A: Focused on shoulder and scapular stability at start of session while transitioning to table for grip and pinch strengthening. Provided new container of red putty per request due to recetn illness. She wanted to throw the original container away in case. Utilized putty to complete grip and pinch strengthening with increased time needed to complete. VC for form and technique were provided during session  as needed.    Body Structure / Function / Physical Skills ADL;Endurance;UE functional use;FMC;GMC;Coordination;Sensation;IADL;Strength    Plan P: Attempt arms on fire. Modify as needed.    Consulted and Agree with Plan of Care Patient           Patient will benefit from skilled therapeutic intervention in order to improve the following deficits and impairments:   Body Structure / Function / Physical Skills: ADL,Endurance,UE functional use,FMC,GMC,Coordination,Sensation,IADL,Strength       Visit Diagnosis: Other lack of coordination  Other symptoms and signs involving the musculoskeletal system    Problem List Patient Active Problem List   Diagnosis Date Noted  . Previous cesarean section 01/11/2019  . S/P cesarean section 01/11/2019  . Gestational diabetes 09/12/2016    Ailene Ravel, OTR/L,CBIS  [phone]  01/23/2021, 6:20 PM  [facility] [address] [place], [place], [address] Phone: [phone]   Fax:  [phone]  Name: Franceska Strahm MRN: [id number] Date of Birth: 05/23/1986

## 2021-01-25 ENCOUNTER — Ambulatory Visit (HOSPITAL_COMMUNITY): Payer: BC Managed Care – PPO | Admitting: Physical Therapy

## 2021-01-25 ENCOUNTER — Ambulatory Visit (HOSPITAL_COMMUNITY): Payer: BC Managed Care – PPO

## 2021-01-29 ENCOUNTER — Ambulatory Visit (HOSPITAL_COMMUNITY): Payer: BC Managed Care – PPO | Admitting: Physical Therapy

## 2021-01-29 ENCOUNTER — Ambulatory Visit (HOSPITAL_COMMUNITY): Payer: BC Managed Care – PPO

## 2021-01-31 ENCOUNTER — Ambulatory Visit (HOSPITAL_COMMUNITY): Payer: BC Managed Care – PPO | Attending: Family Medicine | Admitting: Occupational Therapy

## 2021-01-31 ENCOUNTER — Encounter (HOSPITAL_COMMUNITY): Payer: Self-pay | Admitting: Occupational Therapy

## 2021-01-31 ENCOUNTER — Ambulatory Visit (HOSPITAL_COMMUNITY): Payer: BC Managed Care – PPO | Admitting: Physical Therapy

## 2021-01-31 ENCOUNTER — Other Ambulatory Visit: Payer: Self-pay

## 2021-01-31 DIAGNOSIS — R29898 Other symptoms and signs involving the musculoskeletal system: Secondary | ICD-10-CM | POA: Insufficient documentation

## 2021-01-31 DIAGNOSIS — R278 Other lack of coordination: Secondary | ICD-10-CM | POA: Diagnosis present

## 2021-01-31 NOTE — Therapy (Signed)
[facility] [address] [place], [place], [address] Phone: [phone]   Fax:  [phone]  Occupational Therapy Treatment  Patient Details  Name: Kelly [name] MRN: [id number] Date of Birth: 1986-09-19 Referring Provider (OT): Ronnie Doss, DO   Encounter Date: 01/31/2021   OT End of Session - 01/31/21 0853    Visit Number 13    Number of Visits 19    Date for OT Re-Evaluation 02/17/21    Authorization Type BSBC    Authorization Time Period BCBS 12/02/20-12/01/21 no copay no visit limit    OT Start Time 0826   pt arrived late   OT Stop Time 0900    OT Time Calculation (min) 34 min    Activity Tolerance Patient tolerated treatment well    Behavior During Therapy [facility] for tasks assessed/performed           Past Medical History:  Diagnosis Date  . Headache   . Missed abortion 05/2015   no surgery required  . PONV (postoperative nausea and vomiting)     Past Surgical History:  Procedure Laterality Date  . CESAREAN SECTION N/A 09/12/2016   Procedure: CESAREAN SECTION;  Surgeon: Marylynn Pearson, MD;  Location: [facility];  Service: Obstetrics;  Laterality: N/A;  RNFA  . CESAREAN SECTION N/A 01/11/2019   Procedure: CESAREAN SECTION;  Surgeon: Marylynn Pearson, MD;  Location: [facility];  Service: Obstetrics;  Laterality: N/A;  Repeat edc 01/15/19 NKDA [name] RNFA  . DILATION AND EVACUATION N/A 12/23/2017   Procedure: DILATATION AND EVACUATION with Intraoperative Ultrasound ;  Surgeon: Marylynn Pearson, MD;  Location: [facility];  Service: Gynecology;  Laterality: N/A;  . WISDOM TOOTH EXTRACTION      There were no vitals filed for this visit.   Subjective Assessment - 01/31/21 0829    Subjective  S: My shoulder is getting stronger.    Currently in Pain? No/denies              [facility] OT Assessment - 01/31/21 0829      Assessment   Medical Diagnosis CVA right side weakness      Precautions    Precautions Other (comment)    Precaution Comments [redacted] weeks pregnant on 01/31/21                    OT Treatments/Exercises (OP) - 01/31/21 0830      Exercises   Exercises Shoulder;Hand;Theraputty      Shoulder Exercises: Therapy Diona Foley   Other Therapy Ball Exercises Holding basketball and wearing 1# wrist weights: chest press, flexion, circles each direction, 15X      Shoulder Exercises: ROM/Strengthening   Other ROM/Strengthening Exercises arms on fire: 2' total, 4 movements-15" each    Other ROM/Strengthening Exercises ABC writing holding red weighted ball, RUE at 90 degrees flexion      Hand Exercises   Hand Gripper with Large Beads all beads gripper at 42#, vertical    Hand Gripper with Medium Beads all beads gripper at 42#, vertical    Hand Gripper with Small Beads all beads gripper at 42#, horizontal      Fine Motor Coordination (Hand/Wrist)   Fine Motor Coordination Grooved pegs;In hand manipuation training    In Scientist, clinical (histocompatibility and immunogenetics) Timed perfection game: pt placing 11 pieces in 1' on first trial, 10 in 1' on 2nd trial. Also completing metal fine motor puzzles, completing    Grooved pegs Pt using tweezers to place pegs into pegboard,  maintaining tripod grasp on tweezers. Mod difficulty with task, increased time required.                    OT Short Term Goals - 01/18/21 0746      OT SHORT TERM GOAL #1   Title Patient will be educated and independent with HEP in order to faciliate her progress in therapy and allow her to return to using her right UE as her dominant for 75% or more of the time.    Time 4    Period Weeks    Status On-going    Target Date 12/19/20      OT SHORT TERM GOAL #2   Title Patient increase her right grip strength by 10# and pinch strength by 5# in order to manipulate and open (not tight) jars or containers with less difficulty.    Time 4    Period Weeks      OT SHORT TERM GOAL #3   Title Patient will demonstrate an  increase with right hand coordination by completing the 9 hole peg test in 50" or less allowing her to complet handwriting tasks with moderate difficulty and fatigue.    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    Time 8    Period Weeks    Status Partially Met      OT LONG TERM GOAL #2   Title Patient will increase her right hand grip strength to 70# and pinch strength to 18# (lateral) and 20# (3 point) in order to change diapers and open containers/bottles with no difficulty.    Time 8    Period Weeks    Status On-going      OT LONG TERM GOAL #3   Title Patient will demonstrate an increase in coordination by completing the 9 hole peg test in 30" or less in order to progress her handwriting legibility to as close to baseline as possible.    Time 8    Period Weeks    Status On-going      OT LONG TERM GOAL #4   Title Patient will demonstrate improved gross motor coordination while using her right hand as her dominant extremity during self feeding activities 100% of the time.    Time 8    Period Weeks                 Plan - 01/31/21 0840    Clinical Impression Statement A: Began session with shoulder and scapular stability work, added arms on Biomedical engineer. Also added 1# wrist weight to therapy ball strengthening task. Pt completing grooved pegboard using tweezers, completed in 7 minutes versus 15 minutes at previous trial. Continued with grip strengthening. Verbal cuing for form and technique.    Body Structure / Function / Physical Skills ADL;Endurance;UE functional use;FMC;GMC;Coordination;Sensation;IADL;Strength    Plan P: Continue with shoulder/scapular strengthening and stability work, fine motor work; attempt to increase Marketing executive    OT Home  Exercise Plan Eval: yellow theraputty - grip and pinch strength. Proximal shoulder strengthening and coordination tasks; 12/28: updated to red theraputty; 1/13: pencil control packet.    Consulted and Agree with  Plan of Care Patient           Patient will benefit from skilled therapeutic intervention in order to improve the following deficits and impairments:   Body Structure / Function / Physical Skills: ADL,Endurance,UE functional use,FMC,GMC,Coordination,Sensation,IADL,Strength       Visit Diagnosis: Other lack of coordination  Other symptoms and signs involving the musculoskeletal system    Problem List Patient Active Problem List   Diagnosis Date Noted  . Previous cesarean section 01/11/2019  . S/P cesarean section 01/11/2019  . Gestational diabetes 09/12/2016   Guadelupe Sabin, OTR/L  [phone] 01/31/2021, 9:00 AM  [facility] [address] [place], [place], [address] Phone: [phone]   Fax:  [phone]  Name: Taviana Westergren MRN: [id number] Date of Birth: 16-Jul-1986

## 2021-02-05 ENCOUNTER — Ambulatory Visit (HOSPITAL_COMMUNITY): Payer: BC Managed Care – PPO

## 2021-02-05 ENCOUNTER — Other Ambulatory Visit: Payer: Self-pay

## 2021-02-05 ENCOUNTER — Encounter (HOSPITAL_COMMUNITY): Payer: Self-pay

## 2021-02-05 DIAGNOSIS — R29898 Other symptoms and signs involving the musculoskeletal system: Secondary | ICD-10-CM

## 2021-02-05 DIAGNOSIS — R278 Other lack of coordination: Secondary | ICD-10-CM

## 2021-02-05 NOTE — Patient Instructions (Signed)
   1) (Home) Extension: Isometric / Bilateral Arm Retraction    Facing anchor, hold hands and elbow at shoulder height, with elbow bent.  Pull arms back to squeeze shoulder blades together. Repeat 10-15 times. 1-2 times/day.   2) (Clinic) Extension / Flexion (Assist)   Face anchor, pull arms back, keeping elbow straight, and squeze shoulder blades together. Repeat 10-15 times. 1-2 times/day.   Copyright  VHI. All rights reserved.   3) (Home) Retraction: Row - Bilateral (Anchor)   Facing anchor, arms reaching forward, pull hands toward stomach, keeping elbows bent and at your sides and pinching shoulder blades together. Repeat 10-15 times. 1-2 times/day.   Copyright  VHI. All rights reserved.

## 2021-02-05 NOTE — Therapy (Signed)
[facility] [place], [place], [address] Phone: [phone]   Fax:  [phone]  Occupational Therapy Treatment  Patient Details  Name: Kelly [name] MRN: [id number] Date of Birth: [birth year] Referring Provider (OT): Ronnie Doss, DO   Encounter Date: 02/05/2021   OT End of Session - 02/05/21 1221    Visit Number 14    Number of Visits 19    Date for OT Re-Evaluation 02/17/21    Authorization Type BSBC    Authorization Time Period BCBS 12/02/20-12/01/21 no copay no visit limit    OT Start Time 1115    OT Stop Time 1154    OT Time Calculation (min) 39 min    Activity Tolerance Patient tolerated treatment well    Behavior During Therapy [facility] for tasks assessed/performed           Past Medical History:  Diagnosis Date  . Headache   . Missed abortion 05/2015   no surgery required  . PONV (postoperative nausea and vomiting)     Past Surgical History:  Procedure Laterality Date  . CESAREAN SECTION N/A 09/12/2016   Procedure: CESAREAN SECTION;  Surgeon: Marylynn Pearson, MD;  Location: [facility];  Service: Obstetrics;  Laterality: N/A;  RNFA  . CESAREAN SECTION N/A 01/11/2019   Procedure: CESAREAN SECTION;  Surgeon: Marylynn Pearson, MD;  Location: [facility];  Service: Obstetrics;  Laterality: N/A;  Repeat edc 01/15/19 NKDA [name] RNFA  . DILATION AND EVACUATION N/A 12/23/2017   Procedure: DILATATION AND EVACUATION with Intraoperative Ultrasound ;  Surgeon: Marylynn Pearson, MD;  Location: [facility];  Service: Gynecology;  Laterality: N/A;  . WISDOM TOOTH EXTRACTION      There were no vitals filed for this visit.   Subjective Assessment - 02/05/21 1222    Subjective  S: nothing new to report.    Currently in Pain? No/denies              [facility] OT Assessment - 02/05/21 1124      Assessment   Medical Diagnosis CVA right side weakness      Precautions   Precautions Other (comment)     Precaution Comments [redacted] weeks pregnant on 01/31/21                    OT Treatments/Exercises (OP) - 02/05/21 1116      Exercises   Exercises Shoulder;Hand;Theraputty      Shoulder Exercises: Standing   Flexion Strengthening;Both;10 reps   2 sets; raising to shoulder level then down   Extension Theraband;20 reps    Theraband Level (Shoulder Extension) Level 2 (Red)    Row Theraband;20 reps    Theraband Level (Shoulder Row) Level 2 (Red)    Retraction Theraband;20 reps    Theraband Level (Shoulder Retraction) Level 2 (Red)    Other Standing Exercises scaption; 2#; 2 sets; 10X; to shoulder level only    Other Standing Exercises combo: bicep curl to overhead press then reverse movement coming down; 10X; 2 sets, 2#      Shoulder Exercises: ROM/Strengthening   Other ROM/Strengthening Exercises ABC writing while holding 2# hand weight in RUE. Remain at shoulder level      Hand Exercises   Hand Gripper with Large Beads all beads gripper at 55#, vertical    Hand Gripper with Medium Beads all beads gripper at 55#, 1/2 completed vertically and remaining half horizontally.    Hand Gripper with Small Beads all beads gripper at  55#, horizontal      Fine Motor Coordination (Hand/Wrist)   Fine Motor Coordination Small Pegboard    Small Pegboard Small colored pattern pegboard completed while placing pegs in pegboard using tweezer in right hand using a functional tripod grasp.                  OT Education - 02/05/21 1221    Education Details red band scapular strengthening    Person(s) Educated Patient    Methods Explanation;Demonstration;Handout    Comprehension Returned demonstration;Verbalized understanding            OT Short Term Goals - 01/18/21 0746      OT SHORT TERM GOAL #1   Title Patient will be educated and independent with HEP in order to faciliate her progress in therapy and allow her to return to using her right UE as her dominant for 75% or more of the  time.    Time 4    Period Weeks    Status On-going    Target Date 12/19/20      OT SHORT TERM GOAL #2   Title Patient increase her right grip strength by 10# and pinch strength by 5# in order to manipulate and open (not tight) jars or containers with less difficulty.    Time 4    Period Weeks      OT SHORT TERM GOAL #3   Title Patient will demonstrate an increase with right hand coordination by completing the 9 hole peg test in 50" or less allowing her to complet handwriting tasks with moderate difficulty and fatigue.    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    Time 8    Period Weeks    Status Partially Met      OT LONG TERM GOAL #2   Title Patient will increase her right hand grip strength to 70# and pinch strength to 18# (lateral) and 20# (3 point) in order to change diapers and open containers/bottles with no difficulty.    Time 8    Period Weeks    Status On-going      OT LONG TERM GOAL #3   Title Patient will demonstrate an increase in coordination by completing the 9 hole peg test in 30" or less in order to progress her handwriting legibility to as close to baseline as possible.    Time 8    Period Weeks    Status On-going      OT LONG TERM GOAL #4   Title Patient will demonstrate improved gross motor coordination while using her right hand as her dominant extremity during self feeding activities 100% of the time.    Time 8    Period Weeks                 Plan - 02/05/21 1222    Clinical Impression Statement A: Session focused on fine motor coordination, grip strength, and shoulder/scapular strengthening. Completed scapular strengthening with red theraband and was provided with 3 exercises for HEP. Completed additional shoulder stability exercises  using 2# weights and large therapy mirror to provided visual feedback on form. Rest breaks taken as needed. VC for  form and technique provided. Completed fine motor task with tweezers and pattern pegboard demonstrating minimal difficulty. Able to increase handgripper resistance and utilized both vertical and horizontal position of gripper to complete with increased time if needed.    Body Structure / Function / Physical Skills ADL;Endurance;UE functional use;FMC;GMC;Coordination;Sensation;IADL;Strength    Plan P: Follow up on HEP. continue with shoulder/scapular strengthening and fine motor work.    OT Home Exercise Plan Eval: yellow theraputty - grip and pinch strength. Proximal shoulder strengthening and coordination tasks; 12/28: updated to red theraputty; 1/13: pencil control packet. 3/7: red band scapular strengthening    Consulted and Agree with Plan of Care Patient           Patient will benefit from skilled therapeutic intervention in order to improve the following deficits and impairments:   Body Structure / Function / Physical Skills: ADL,Endurance,UE functional use,FMC,GMC,Coordination,Sensation,IADL,Strength       Visit Diagnosis: Other symptoms and signs involving the musculoskeletal system  Other lack of coordination    Problem List Patient Active Problem List   Diagnosis Date Noted  . Previous cesarean section 01/11/2019  . S/P cesarean section 01/11/2019  . Gestational diabetes 09/12/2016   Ailene Ravel, OTR/L,CBIS  [phone]  02/05/2021, 12:26 PM  [facility] [address] [place], [place], [address] Phone: [phone]   Fax:  [phone]  Name: Celisa Schoenberg MRN: [id number] Date of Birth: 1986-01-27

## 2021-02-07 ENCOUNTER — Encounter (HOSPITAL_COMMUNITY): Payer: Self-pay | Admitting: Occupational Therapy

## 2021-02-07 ENCOUNTER — Ambulatory Visit (HOSPITAL_COMMUNITY): Payer: BC Managed Care – PPO | Admitting: Occupational Therapy

## 2021-02-07 ENCOUNTER — Other Ambulatory Visit: Payer: Self-pay

## 2021-02-07 ENCOUNTER — Ambulatory Visit (HOSPITAL_COMMUNITY): Payer: BC Managed Care – PPO | Admitting: Physical Therapy

## 2021-02-07 DIAGNOSIS — R278 Other lack of coordination: Secondary | ICD-10-CM | POA: Diagnosis not present

## 2021-02-07 DIAGNOSIS — R29898 Other symptoms and signs involving the musculoskeletal system: Secondary | ICD-10-CM

## 2021-02-07 NOTE — Therapy (Signed)
[facility] [address] [place], [place], [address] Phone: [phone]   Fax:  [phone]  Occupational Therapy Treatment  Patient Details  Name: Kelly [name] MRN: [id number] Date of Birth: [birth year] Referring Provider (OT): Ronnie Doss, DO   Encounter Date: 02/07/2021   OT End of Session - 02/07/21 0859    Visit Number 15    Number of Visits 19    Date for OT Re-Evaluation 02/17/21    Authorization Type BSBC    Authorization Time Period BCBS 12/02/20-12/01/21 no copay no visit limit    OT Start Time 0820    OT Stop Time 0858    OT Time Calculation (min) 38 min    Activity Tolerance Patient tolerated treatment well    Behavior During Therapy [facility] for tasks assessed/performed           Past Medical History:  Diagnosis Date  . Headache   . Missed abortion 05/2015   no surgery required  . PONV (postoperative nausea and vomiting)     Past Surgical History:  Procedure Laterality Date  . CESAREAN SECTION N/A 09/12/2016   Procedure: CESAREAN SECTION;  Surgeon: Marylynn Pearson, MD;  Location: [facility];  Service: Obstetrics;  Laterality: N/A;  RNFA  . CESAREAN SECTION N/A 01/11/2019   Procedure: CESAREAN SECTION;  Surgeon: Marylynn Pearson, MD;  Location: [facility];  Service: Obstetrics;  Laterality: N/A;  Repeat edc 01/15/19 NKDA [name] RNFA  . DILATION AND EVACUATION N/A 12/23/2017   Procedure: DILATATION AND EVACUATION with Intraoperative Ultrasound ;  Surgeon: Marylynn Pearson, MD;  Location: [facility];  Service: Gynecology;  Laterality: N/A;  . WISDOM TOOTH EXTRACTION      There were no vitals filed for this visit.   Subjective Assessment - 02/07/21 0821    Subjective  S: I'm feeling pretty good.    Currently in Pain? No/denies              [facility] OT Assessment - 02/07/21 0820      Assessment   Medical Diagnosis CVA right side weakness      Precautions   Precautions Other (comment)     Precaution Comments [redacted] weeks pregnant on 02/07/21                    OT Treatments/Exercises (OP) - 02/07/21 0822      Exercises   Exercises Shoulder;Hand;Theraputty      Shoulder Exercises: Standing   Flexion Strengthening;Both;10 reps   2 sets, to shoulder level   Shoulder Flexion Weight (lbs) 2    Extension Theraband;20 reps    Theraband Level (Shoulder Extension) Level 2 (Red)    Row Theraband;20 reps    Theraband Level (Shoulder Row) Level 2 (Red)    Retraction Theraband;20 reps    Theraband Level (Shoulder Retraction) Level 2 (Red)    Other Standing Exercises scaption; 2#; 2 sets; 10X; to shoulder level only    Other Standing Exercises combo: bicep curl to overhead press then reverse movement coming down; 10X; 2 sets, 2#      Shoulder Exercises: ROM/Strengthening   Ball on Wall 1' flexion 1' abduction red ball    Other ROM/Strengthening Exercises ABC writing while holding 2# hand weight in RUE. Remain at shoulder level      Hand Exercises   Hand Gripper with Large Beads all beads gripper at 55#, horizontal wearing 2# wrist weight and placing beads in elevated bucket    Hand Gripper  with Medium Beads all beads gripper at 55#, horizontal wearing 2# wrist weight and placing beads in elevated bucket    Hand Gripper with Small Beads all beads gripper at 55#, horizontal wearing 2# wrist weight and placing beads in elevated bucket    Other Hand Exercises Pt pulling through red theraputty using pvc pipe, working on grip strength and wrist stability      Fine Motor Coordination (Hand/Wrist)   Handwriting Pt using broken crayon to trace various lines and circles, working on sustained pinch as well as fluidity required for handwriting tasks. Pt then coloring in circles working on isolate fine motor movements, using circular motions versus horizontal or vertical.                    OT Short Term Goals - 01/18/21 0746      OT SHORT TERM GOAL #1   Title Patient  will be educated and independent with HEP in order to faciliate her progress in therapy and allow her to return to using her right UE as her dominant for 75% or more of the time.    Time 4    Period Weeks    Status On-going    Target Date 12/19/20      OT SHORT TERM GOAL #2   Title Patient increase her right grip strength by 10# and pinch strength by 5# in order to manipulate and open (not tight) jars or containers with less difficulty.    Time 4    Period Weeks      OT SHORT TERM GOAL #3   Title Patient will demonstrate an increase with right hand coordination by completing the 9 hole peg test in 50" or less allowing her to complet handwriting tasks with moderate difficulty and fatigue.    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    Time 8    Period Weeks    Status Partially Met      OT LONG TERM GOAL #2   Title Patient will increase her right hand grip strength to 70# and pinch strength to 18# (lateral) and 20# (3 point) in order to change diapers and open containers/bottles with no difficulty.    Time 8    Period Weeks    Status On-going      OT LONG TERM GOAL #3   Title Patient will demonstrate an increase in coordination by completing the 9 hole peg test in 30" or less in order to progress her handwriting legibility to as close to baseline as possible.    Time 8    Period Weeks    Status On-going      OT LONG TERM GOAL #4   Title Patient will demonstrate improved gross motor coordination while using her right hand as her dominant extremity during self feeding activities 100% of the time.    Time 8    Period Weeks                 Plan - 02/07/21 6237    Clinical Impression Statement A: Pt reports HEP is going well. Reports minimal tremors in the morning but  by evening her RUE is  fatigued and shakes a lot with use. Continued with shoulder/scapular strengthening at beginning of session. Pt with good control, mod fatigue, rest breaks provided as needed. Added wrist weight and functional reaching to hand gripper task today.  Pt working on line and circle tracing task using broken crayon to promote pinch strengthening. Verbal cuing for form and technique intermittently.    Body Structure / Function / Physical Skills ADL;Endurance;UE functional use;FMC;GMC;Coordination;Sensation;IADL;Strength    Plan P: continue with shoulder/scapular strengthening, isolated fine motor movements such as horizontal/vertical/diagonal coloring or tracing task    OT Home Exercise Plan Eval: yellow theraputty - grip and pinch strength. Proximal shoulder strengthening and coordination tasks; 12/28: updated to red theraputty; 1/13: pencil control packet. 3/7: red band scapular strengthening    Consulted and Agree with Plan of Care Patient           Patient will benefit from skilled therapeutic intervention in order to improve the following deficits and impairments:   Body Structure / Function / Physical Skills: ADL,Endurance,UE functional use,FMC,GMC,Coordination,Sensation,IADL,Strength       Visit Diagnosis: Other symptoms and signs involving the musculoskeletal system  Other lack of coordination    Problem List Patient Active Problem List   Diagnosis Date Noted  . Previous cesarean section 01/11/2019  . S/P cesarean section 01/11/2019  . Gestational diabetes 09/12/2016   Guadelupe Sabin, OTR/L  [phone] 02/07/2021, 9:00 AM  [facility] [address] [place], [place], [address] Phone: [phone]   Fax:  [phone]  Name: Kelly [name] MRN: [id number] Date of Birth: 1986-11-15

## 2021-02-12 ENCOUNTER — Ambulatory Visit (HOSPITAL_COMMUNITY): Payer: BC Managed Care – PPO

## 2021-02-12 ENCOUNTER — Telehealth (HOSPITAL_COMMUNITY): Payer: Self-pay

## 2021-02-12 NOTE — Telephone Encounter (Signed)
She has another MD apptment in [facility] and will not be here this morning.

## 2021-02-14 ENCOUNTER — Encounter (HOSPITAL_COMMUNITY): Payer: Self-pay | Admitting: Occupational Therapy

## 2021-02-14 ENCOUNTER — Ambulatory Visit (HOSPITAL_COMMUNITY): Payer: BC Managed Care – PPO | Admitting: Physical Therapy

## 2021-02-14 ENCOUNTER — Other Ambulatory Visit: Payer: Self-pay

## 2021-02-14 ENCOUNTER — Ambulatory Visit (HOSPITAL_COMMUNITY): Payer: BC Managed Care – PPO | Admitting: Occupational Therapy

## 2021-02-14 DIAGNOSIS — R29898 Other symptoms and signs involving the musculoskeletal system: Secondary | ICD-10-CM

## 2021-02-14 DIAGNOSIS — R278 Other lack of coordination: Secondary | ICD-10-CM | POA: Diagnosis not present

## 2021-02-14 NOTE — Therapy (Signed)
[facility] [address] [place], [place], [address] Phone: [phone]   Fax:  [phone]  Occupational Therapy Reassessment, Treatment, Discharge Summary  Patient Details  Name: Kelly [name] MRN: [id number] Date of Birth: [birth year] Referring Provider (OT): Ronnie Doss, DO   Encounter Date: 02/14/2021   OT End of Session - 02/14/21 0857    Visit Number 16    Number of Visits 19    Date for OT Re-Evaluation 02/17/21    Authorization Type BSBC    Authorization Time Period BCBS 12/02/20-12/01/21 no copay no visit limit    OT Start Time 0820    OT Stop Time 0854    OT Time Calculation (min) 34 min    Activity Tolerance Patient tolerated treatment well    Behavior During Therapy [facility] for tasks assessed/performed           Past Medical History:  Diagnosis Date  . Headache   . Missed abortion 05/2015   no surgery required  . PONV (postoperative nausea and vomiting)     Past Surgical History:  Procedure Laterality Date  . CESAREAN SECTION N/A 09/12/2016   Procedure: CESAREAN SECTION;  Surgeon: Marylynn Pearson, MD;  Location: [facility];  Service: Obstetrics;  Laterality: N/A;  RNFA  . CESAREAN SECTION N/A 01/11/2019   Procedure: CESAREAN SECTION;  Surgeon: Marylynn Pearson, MD;  Location: [facility];  Service: Obstetrics;  Laterality: N/A;  Repeat edc 01/15/19 NKDA [name] RNFA  . DILATION AND EVACUATION N/A 12/23/2017   Procedure: DILATATION AND EVACUATION with Intraoperative Ultrasound ;  Surgeon: Marylynn Pearson, MD;  Location: [facility];  Service: Gynecology;  Laterality: N/A;  . WISDOM TOOTH EXTRACTION      There were no vitals filed for this visit.   Subjective Assessment - 02/14/21 0823    Subjective  S: My pointer finger shakes a lot.    Currently in Pain? No/denies              [facility] OT Assessment - 02/14/21 5573      Assessment   Medical Diagnosis CVA right side weakness       Precautions   Precautions Other (comment)    Precaution Comments [redacted] weeks pregnant on 02/07/21      Coordination   Right 9 Hole Peg Test 26.92"   33.80" previous     Strength   Right Shoulder Flexion 5/5   4+/5 previous   Right Shoulder External Rotation 5/5   4+/5 previous   Right Hand Grip (lbs) 70   55 previous   Right Hand Lateral Pinch 16 lbs   15 previous   Right Hand 3 Point Pinch 18 lbs   same as previous                   OT Treatments/Exercises (OP) - 02/14/21 0848      Exercises   Exercises Shoulder;Hand;Theraputty      Shoulder Exercises: Therapy Ball   Other Therapy Ball Exercises Holding orange kickball: overhread press, chest press, flexion, circles each direction, diagonals, 15X      Hand Exercises   Other Hand Exercises Red theraputty: seated finger extension, finger extension with looped putty, finger abduction with 2nd and 3rd digits, 10X each      Theraputty   Theraputty - Flatten red - seated                    OT Short Term Goals - 02/14/21 2202  OT SHORT TERM GOAL #1   Title Patient will be educated and independent with HEP in order to faciliate her progress in therapy and allow her to return to using her right UE as her dominant for 75% or more of the time.    Time 4    Period Weeks    Status Achieved    Target Date 12/19/20      OT SHORT TERM GOAL #2   Title Patient increase her right grip strength by 10# and pinch strength by 5# in order to manipulate and open (not tight) jars or containers with less difficulty.    Time 4    Period Weeks      OT SHORT TERM GOAL #3   Title Patient will demonstrate an increase with right hand coordination by completing the 9 hole peg test in 50" or less allowing her to complet handwriting tasks with moderate difficulty and fatigue.    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    Time 8    Period Weeks    Status Achieved      OT LONG TERM GOAL #2   Title Patient will increase her right hand grip strength to 70# and pinch strength to 18# (lateral) and 20# (3 point) in order to change diapers and open containers/bottles with no difficulty.    Time 8    Period Weeks    Status Partially Met      OT LONG TERM GOAL #3   Title Patient will demonstrate an increase in coordination by completing the 9 hole peg test in 30" or less in order to progress her handwriting legibility to as close to baseline as possible.    Time 8    Period Weeks    Status Achieved      OT LONG TERM GOAL #4   Title Patient will demonstrate improved gross motor coordination while using her right hand as her dominant extremity during self feeding activities 100% of the time.    Time 8    Period Weeks                 Plan - 02/14/21 1025    Clinical Impression Statement A: Reassessment completed this session, pt has met all goals with exception of one LTG which was partially met. Pt reports she feels like she has more strength in her shoulder and is able to complete more tasks for her children-she is currently working on brushing their teeth using her right hand. Pt demonstrates significant improvement in RUE strength, activity tolerance, and functional use during ADLs and exercises. Pt reports difficulty with right index finger fatigue and shaking, more at the end of the day. Provided finger strengthening theraputty activities this session. Educated pt on using RUE as much as possible as dominant to continue improving strength and muscle memory. Pt is agreeable to discharge today.    Body Structure / Function / Physical Skills ADL;Endurance;UE functional use;FMC;GMC;Coordination;Sensation;IADL;Strength    Plan P: Discharge pt    OT Home Exercise Plan Eval: yellow theraputty - grip and pinch  strength. Proximal shoulder strengthening and coordination tasks; 12/28: updated to red theraputty; 1/13: pencil control packet.  3/7: red band scapular strengthening; 3/16: therapy ball strengthening, fine motor theraputty tasks    Consulted and Agree with Plan of Care Patient           Patient will benefit from skilled therapeutic intervention in order to improve the following deficits and impairments:   Body Structure / Function / Physical Skills: ADL,Endurance,UE functional use,FMC,GMC,Coordination,Sensation,IADL,Strength       Visit Diagnosis: Other symptoms and signs involving the musculoskeletal system  Other lack of coordination    Problem List Patient Active Problem List   Diagnosis Date Noted  . Previous cesarean section 01/11/2019  . S/P cesarean section 01/11/2019  . Gestational diabetes 09/12/2016   Guadelupe Sabin, OTR/L  [phone] 02/14/2021, 9:14 AM  [facility] [address] [place], [place], [address] Phone: [phone]   Fax:  [phone]  Name: Kelly [name] MRN: [id number] Date of Birth: 07-16-1986   OCCUPATIONAL THERAPY DISCHARGE SUMMARY  Visits from Start of Care: 16  Current functional level related to goals / functional outcomes: See above. Pt has met all goals with exception of 1 LTG which has been partially met. Pt reporting improved shoulder strength and functional use of RUE. She has greatly improved coordination both gross and fine motor.    Remaining deficits: Decreased activity tolerance, muscle fatigue after sustained use, slightly decreased pinch strength and coordination   Education / Equipment: HEP for fine motor strengthening, proximal shoulder strengthening/stability  Plan: Patient agrees to discharge.  Patient goals were met. Patient is being discharged due to meeting the stated rehab goals.  ?????

## 2021-02-14 NOTE — Patient Instructions (Addendum)
T herapy Ball Strengthening Exercises: Complete all exercises 10-15X each, 2x/day.   1) Overhead press: Hold a medicine ball or other ball at your chest. Next, extend your elbows and push the ball over your head as shown. Return to starting position and repeat.     2) Chest press: Hold a medicine ball or other ball at your chest. Next, extend your elbows and push the ball outward in front of your body as shown. Return to starting position and repeat.     3) Ball circles:  Hold a medicine ball or other ball at your chest. Next, extend your elbows and push the ball outward in front of your body as shown. Then, move the ball in a circular pattern for several revolutions and then reverse the direction.     4) Flexion: Start holding an exercise ball out in front of your body with elbows extended. Then, slowly lift the ball up over head. Return the ball to starting position and repeat.     5) Ball diagonals: Begin holding a ball with both hands and then move it through a diagonal pattern from your hip to over your opposite shoulder as shown.     Fine Motor Strengthening:   Access Code: [email or web address] URL: [email or web address] Date: 02/14/2021 Prepared by: Ezra Sites  Exercises Seated Finger Extension with Putty - 2 x daily - 5 x weekly - 1 sets - 10 reps Finger Exension with Putty - 2 x daily - 5 x weekly - 1 sets - 10 reps Finger Abduction with Putty - 2 x daily - 5 x weekly - 1 sets - 10 reps Seated Finger Extension with Putty - 2 x daily - 5 x weekly - 1 sets - 10 reps

## 2021-02-19 ENCOUNTER — Ambulatory Visit (HOSPITAL_COMMUNITY): Payer: BC Managed Care – PPO

## 2021-02-19 ENCOUNTER — Encounter (HOSPITAL_COMMUNITY): Payer: BC Managed Care – PPO

## 2021-02-21 ENCOUNTER — Ambulatory Visit (HOSPITAL_COMMUNITY): Payer: BC Managed Care – PPO

## 2021-02-21 ENCOUNTER — Encounter (HOSPITAL_COMMUNITY): Payer: BC Managed Care – PPO

## 2021-02-26 ENCOUNTER — Encounter (HOSPITAL_COMMUNITY): Payer: BC Managed Care – PPO

## 2021-02-26 ENCOUNTER — Ambulatory Visit (HOSPITAL_COMMUNITY): Payer: BC Managed Care – PPO

## 2021-02-28 ENCOUNTER — Ambulatory Visit (HOSPITAL_COMMUNITY): Payer: BC Managed Care – PPO

## 2021-02-28 ENCOUNTER — Encounter (HOSPITAL_COMMUNITY): Payer: BC Managed Care – PPO | Admitting: Occupational Therapy

## 2021-03-05 ENCOUNTER — Encounter (HOSPITAL_COMMUNITY): Payer: BC Managed Care – PPO

## 2021-03-05 ENCOUNTER — Ambulatory Visit (HOSPITAL_COMMUNITY): Payer: BC Managed Care – PPO

## 2021-03-07 ENCOUNTER — Ambulatory Visit (HOSPITAL_COMMUNITY): Payer: BC Managed Care – PPO

## 2021-03-07 ENCOUNTER — Encounter (HOSPITAL_COMMUNITY): Payer: BC Managed Care – PPO

## 2021-04-16 ENCOUNTER — Ambulatory Visit: Payer: BC Managed Care – PPO | Admitting: Nurse Practitioner

## 2021-04-16 ENCOUNTER — Other Ambulatory Visit: Payer: Self-pay

## 2021-04-16 ENCOUNTER — Encounter: Payer: Self-pay | Admitting: Nurse Practitioner

## 2021-04-16 VITALS — BP 103/68 | HR 91 | Temp 98.0°F | Resp 20 | Ht 64.0 in | Wt 166.0 lb

## 2021-04-16 DIAGNOSIS — R300 Dysuria: Secondary | ICD-10-CM | POA: Diagnosis not present

## 2021-04-16 LAB — URINALYSIS, COMPLETE
Bilirubin, UA: NEGATIVE
Glucose, UA: NEGATIVE
Ketones, UA: NEGATIVE
Leukocytes,UA: NEGATIVE
Nitrite, UA: NEGATIVE
Protein,UA: NEGATIVE
Specific Gravity, UA: 1.01 (ref 1.005–1.030)
Urobilinogen, Ur: 0.2 mg/dL (ref 0.2–1.0)
pH, UA: 7 (ref 5.0–7.5)

## 2021-04-16 LAB — MICROSCOPIC EXAMINATION
Bacteria, UA: NONE SEEN
WBC, UA: NONE SEEN /hpf (ref 0–5)

## 2021-04-16 NOTE — Patient Instructions (Signed)

## 2021-04-16 NOTE — Progress Notes (Signed)
Acute Office Visit  Subjective:    Patient ID: Kelly [name], female    DOB: 13-Dec-1985, 35 y.o.   MRN: [id number]  Chief Complaint  Patient presents with  . Dysuria    Had a C section last Tuesday and had a catheter     HPI Patient is in today for  Chief Complaint: Dysuria (Had a C section last Tuesday and had a catheter/)   HPI  Patient had c-section last Tuesday and had a catheter during the procedure, began experiencing painful urination yesterday morning, also c/o left flank pain. Burning pain which is worse at the end of urination. Denies discharge or physical changes. Took at home UTI test with postiive results. States no recent abx use but is currently breastfeeding.    Past Medical History:  Diagnosis Date  . Headache   . Missed abortion 05/2015   no surgery required  . PONV (postoperative nausea and vomiting)     Past Surgical History:  Procedure Laterality Date  . CESAREAN SECTION N/A 09/12/2016   Procedure: CESAREAN SECTION;  Surgeon: Zelphia Cairo, MD;  Location: [facility];  Service: Obstetrics;  Laterality: N/A;  RNFA  . CESAREAN SECTION N/A 01/11/2019   Procedure: CESAREAN SECTION;  Surgeon: Zelphia Cairo, MD;  Location: [facility];  Service: Obstetrics;  Laterality: N/A;  Repeat edc 01/15/19 NKDA [name] RNFA  . DILATION AND EVACUATION N/A 12/23/2017   Procedure: DILATATION AND EVACUATION with Intraoperative Ultrasound ;  Surgeon: Zelphia Cairo, MD;  Location: [facility];  Service: Gynecology;  Laterality: N/A;  . WISDOM TOOTH EXTRACTION      Family History  Problem Relation Age of Onset  . Healthy Mother   . Cancer Father        skin  . Cancer Other   . Cancer Maternal Aunt   . Hypertension Maternal Uncle   . Hypertension Maternal Grandfather     Social History   Socioeconomic History  . Marital status: Married    Spouse name: Not on file  . Number of children: Not on file  . Years of education: Not on file  .  Highest education level: Not on file  Occupational History  . Not on file  Tobacco Use  . Smoking status: Never Smoker  . Smokeless tobacco: Never Used  Vaping Use  . Vaping Use: Never used  Substance and Sexual Activity  . Alcohol use: No  . Drug use: No  . Sexual activity: Not on file    Comment: approx 12-[redacted] wks gestation per patient 12/22/17  Other Topics Concern  . Not on file  Social History Narrative  . Not on file   Social Determinants of Health   Financial Resource Strain: Not on file  Food Insecurity: Not on file  Transportation Needs: Not on file  Physical Activity: Not on file  Stress: Not on file  Social Connections: Not on file  Intimate Partner Violence: Not on file    Outpatient Medications Prior to Visit  Medication Sig Dispense Refill  . aspirin 81 MG EC tablet Take by mouth.    . enoxaparin (LOVENOX) 100 MG/ML injection Inject into the skin.    . Prenatal Vit-Fe Fumarate-FA (PRENATAL 1+1 PO) Prenatal    . sertraline (ZOLOFT) 25 MG tablet Take 25 mg by mouth daily.    . metFORMIN (GLUCOPHAGE) 500 MG tablet Take 500 mg by mouth at bedtime.     No facility-administered medications prior to visit.    No Known Allergies  Review of Systems  Constitutional: Negative.   HENT: Negative.   Eyes: Negative.   Respiratory: Negative.   Cardiovascular: Negative.   Gastrointestinal: Negative.   Endocrine: Negative.   Genitourinary: Positive for difficulty urinating and dysuria. Negative for frequency, hematuria, urgency and vaginal discharge.  Musculoskeletal: Negative.   Skin: Negative.   Allergic/Immunologic: Negative.   Neurological: Negative.   Hematological: Negative.   Psychiatric/Behavioral: Negative.   All other systems reviewed and are negative.      Objective:    Physical Exam Vitals and nursing note reviewed.  HENT:     Head: Normocephalic.     Right Ear: External ear normal.     Left Ear: External ear normal.     Nose: Nose normal.      Mouth/Throat:     Pharynx: Oropharynx is clear.  Cardiovascular:     Rate and Rhythm: Normal rate and regular rhythm.     Pulses: Normal pulses.     Heart sounds: Normal heart sounds.  Pulmonary:     Effort: Pulmonary effort is normal.     Breath sounds: Normal breath sounds.  Abdominal:     General: A surgical scar is present. Bowel sounds are normal.     Palpations: Abdomen is soft.     Tenderness: There is generalized abdominal tenderness.  Musculoskeletal:        General: Normal range of motion.  Skin:    General: Skin is warm and dry.     Capillary Refill: Capillary refill takes less than 2 seconds.  Neurological:     General: No focal deficit present.     Mental Status: She is alert and oriented to person, place, and time. Mental status is at baseline.  Psychiatric:        Mood and Affect: Mood normal.        Behavior: Behavior normal.        Thought Content: Thought content normal.        Judgment: Judgment normal.     BP 103/68   Pulse 91   Temp 98 F (36.7 C) (Temporal)   Resp 20   Ht 5\' 4"  (1.626 m)   Wt 166 lb (75.3 kg)   SpO2 97%   BMI 28.49 kg/m  Wt Readings from Last 3 Encounters:  04/16/21 166 lb (75.3 kg)  05/30/20 185 lb 9.6 oz (84.2 kg)  01/11/19 201 lb 1.6 oz (91.2 kg)    Health Maintenance Due  Topic Date Due  . HEMOGLOBIN A1C  Never done  . PNEUMOCOCCAL POLYSACCHARIDE VACCINE AGE 89-64 HIGH RISK  Never done  . FOOT EXAM  Never done  . OPHTHALMOLOGY EXAM  Never done  . URINE MICROALBUMIN  Never done  . Hepatitis C Screening  Never done  . PAP SMEAR-Modifier  02/21/2019  . COVID-19 Vaccine (3 - Booster for Pfizer series) 11/01/2020    There are no preventive care reminders to display for this patient.   No results found for: TSH Lab Results  Component Value Date   WBC 9.5 01/12/2019   HGB 11.8 (L) 01/12/2019   HCT 35.3 (L) 01/12/2019   MCV 94.9 01/12/2019   PLT 124 (L) 01/12/2019   Lab Results  Component Value Date   NA 134  (L) 09/11/2016   K 4.0 09/11/2016   CO2 22 09/11/2016   GLUCOSE 92 09/11/2016   BUN 14 09/11/2016   CREATININE 0.59 09/11/2016   CALCIUM 9.2 09/11/2016   ANIONGAP 7 09/11/2016   No results  found for: CHOL No results found for: HDL No results found for: LDLCALC No results found for: TRIG No results found for: CHOLHDL No results found for: HDQQ2W     Assessment & Plan:   Problem List Items Addressed This Visit   None   Visit Diagnoses    Dysuria    -  Primary   Relevant Orders   Urinalysis, Complete      Harless Litten in today with chief complaint of Dysuria (Had a C section last Tuesday and had a catheter/)   1. Dysuria Take medication as prescribe Cotton underwear Take shower not bath Cranberry juice, yogurt Force fluids AZO over the counter X2 days Culture pending RTO prn  - Urinalysis, Complete - Urine Culture  * urine clear- will do culture just in case. Will wait on culture results before treating   The above assessment and management plan was discussed with the patient. The patient verbalized understanding of and has agreed to the management plan. Patient is aware to call the clinic if symptoms persist or worsen. Patient is aware when to return to the clinic for a follow-up visit. Patient educated on when it is appropriate to go to the emergency department.   Mary-Margaret Daphine Deutscher, [name]  Shea Stakes, RN

## 2021-04-18 LAB — URINE CULTURE

## 2021-05-10 ENCOUNTER — Ambulatory Visit (INDEPENDENT_AMBULATORY_CARE_PROVIDER_SITE_OTHER): Payer: BC Managed Care – PPO | Admitting: Nurse Practitioner

## 2021-05-10 ENCOUNTER — Encounter: Payer: Self-pay | Admitting: Nurse Practitioner

## 2021-05-10 DIAGNOSIS — J0100 Acute maxillary sinusitis, unspecified: Secondary | ICD-10-CM | POA: Diagnosis not present

## 2021-05-10 MED ORDER — AMOXICILLIN-POT CLAVULANATE 875-125 MG PO TABS: 1.0000 | ORAL_TABLET | Freq: Two times a day (BID) | ORAL | 0 refills | Status: DC

## 2021-05-10 NOTE — Progress Notes (Signed)
Virtual Visit  Note Due to COVID-19 pandemic this visit was conducted virtually. This visit type was conducted due to national recommendations for restrictions regarding the COVID-19 Pandemic (e.g. social distancing, sheltering in place) in an effort to limit this patient's exposure and mitigate transmission in our community. All issues noted in this document were discussed and addressed.  A physical exam was not performed with this format.  I connected with Kelly [name] on 05/10/21 at 10:06 by telephone and verified that I am speaking with the correct person using two identifiers. Kelly [name] is currently located at home and her baby is currently with her during visit. The provider, Mary-Margaret Daphine Deutscher, [name] is located in their office at time of visit.  I discussed the limitations, risks, security and privacy concerns of performing an evaluation and management service by telephone and the availability of in person appointments. I also discussed with the patient that there may be a patient responsible charge related to this service. The patient expressed understanding and agreed to proceed.   History and Present Illness:   Chief Complaint: Sinusitis   HPI Patient calls in c/o cough and congestion for over a week. Her teeth started hurting yesterday with lots of sinus pressure.she has taken no OTC meds because she is breast feeding.     Review of Systems  Constitutional:  Negative for chills and fever.  HENT:  Positive for congestion and sinus pain. Negative for ear discharge, ear pain and sore throat.   Respiratory:  Positive for cough. Negative for shortness of breath.   Neurological:  Negative for headaches.  All other systems reviewed and are negative.   Observations/Objective: Alert and oriented- answers all questions appropriately No distress Cough audible Voice hoarse  Assessment and Plan: Kelly [name] in today with chief complaint of  Sinusitis   1. Acute non-recurrent maxillary sinusitis 1. Take meds as prescribed 2. Use a cool mist humidifier especially during the winter months and when heat has been humid. 3. Use saline nose sprays frequently 4. Saline irrigations of the nose can be very helpful if done frequently.  * 4X daily for 1 week*  * Use of a nettie pot can be helpful with this. Follow directions with this* 5. Drink plenty of fluids 6. Keep thermostat turn down low 7.For any cough or congestion- robitussin OTC 8. For fever or aces or pains- take tylenol or ibuprofen appropriate for age and weight.  * for fevers greater than 101 orally you may alternate ibuprofen and tylenol every  3 hours.   Meds ordered this encounter  Medications   amoxicillin-clavulanate (AUGMENTIN) 875-125 MG tablet    Sig: Take 1 tablet by mouth 2 (two) times daily.    Dispense:  14 tablet    Refill:  0    Order Specific Question:   Supervising Provider    Answer:   Arville Care A [[id number]]       Follow Up Instructions: prn    I discussed the assessment and treatment plan with the patient. The patient was provided an opportunity to ask questions and all were answered. The patient agreed with the plan and demonstrated an understanding of the instructions.   The patient was advised to call back or seek an in-person evaluation if the symptoms worsen or if the condition fails to improve as anticipated.  The above assessment and management plan was discussed with the patient. The patient verbalized understanding of and has agreed to the management plan. Patient is  aware to call the clinic if symptoms persist or worsen. Patient is aware when to return to the clinic for a follow-up visit. Patient educated on when it is appropriate to go to the emergency department.   Time call ended:  10:17  I provided 11 minutes of  non face-to-face time during this encounter.    Mary-Margaret Daphine Deutscher, [name]

## 2021-05-21 DIAGNOSIS — Z9851 Tubal ligation status: Secondary | ICD-10-CM | POA: Insufficient documentation

## 2021-09-06 ENCOUNTER — Other Ambulatory Visit: Payer: Self-pay

## 2021-09-06 ENCOUNTER — Encounter: Payer: Self-pay | Admitting: Family Medicine

## 2021-09-06 ENCOUNTER — Ambulatory Visit: Payer: BC Managed Care – PPO | Admitting: Family Medicine

## 2021-09-06 VITALS — BP 102/63 | HR 84 | Temp 97.7°F | Ht 64.0 in | Wt 177.0 lb

## 2021-09-06 DIAGNOSIS — Z8673 Personal history of transient ischemic attack (TIA), and cerebral infarction without residual deficits: Secondary | ICD-10-CM | POA: Diagnosis not present

## 2021-09-06 DIAGNOSIS — F411 Generalized anxiety disorder: Secondary | ICD-10-CM

## 2021-09-06 DIAGNOSIS — Q2112 Patent foramen ovale: Secondary | ICD-10-CM | POA: Diagnosis not present

## 2021-09-06 DIAGNOSIS — Z8632 Personal history of gestational diabetes: Secondary | ICD-10-CM

## 2021-09-06 DIAGNOSIS — M62838 Other muscle spasm: Secondary | ICD-10-CM

## 2021-09-06 MED ORDER — SERTRALINE HCL 50 MG PO TABS: 50.0000 mg | ORAL_TABLET | Freq: Every day | ORAL | 5 refills | Status: DC

## 2021-09-06 NOTE — Progress Notes (Signed)
Subjective:  Patient ID: Kelly [name], female    DOB: 06/20/1986, 35 y.o.   MRN: [id number]  Patient Care Team: Janora Norlander, DO as PCP - General (Family Medicine)   Chief Complaint:  strained neck muscle (Anxiety, post stroke)   HPI: Kelly [name] is a 35 y.o. female presenting on 09/06/2021 for strained neck muscle (Anxiety, post stroke)   Pt presents today for evaluation of left neck stiffness and increased anxiety.  She has had a significant change in her health since 11/2020 when she had a left PCA stroke during pregnancy. Stroke was thought to be embolic in a state of transient hypercoagulability due to pregnancy but it was noted she had a PFO during this admission. She has been followed by neurology and cardiology since incident. She saw cardiology this week and discussed possible PFO closure. This has made her very anxious. She was started on sertraline months ago but she has not been taking this as prescribed. States she felt [name] of this was situational and not something which required medications. She states she now has stiffness in her left neck. No SI or HI. Was seeing a counselor but is not currently. Feels her anxiety is getting worse due to health issues.   GAD 7 : Generalized Anxiety Score 09/06/2021 04/16/2021 01/08/2016  Nervous, Anxious, on Edge _0 Control/stop worrying 1 0 0  Worry too much - different things 1 0 0  Trouble relaxing 1 0 0  Restless 0 0 0  Easily annoyed or irritable 0 0 3  Afraid - awful might happen 1 0 0  Total GAD 7 Score _1 Anxiety Difficulty Not difficult at [name] Not difficult at [name] -    Depression screen [facility] 2/9 09/06/2021 04/16/2021 05/30/2020 02/23/2018 12/31/2017  Decreased Interest 0 0 0 0 0  Down, Depressed, Hopeless 1 0 0 0 1  PHQ - 2 Score 1 0 0 0 1  Altered sleeping 1 - - - -  Tired, decreased energy 0 - - - -  Change in appetite 0 - - - -  Feeling bad or failure about yourself  0 - - - -  Trouble  concentrating 0 - - - -  Moving slowly or fidgety/restless 0 - - - -  Suicidal thoughts 0 - - - -  PHQ-9 Score 2 - - - -  Difficult doing work/chores Not difficult at [name] - - - -      Relevant past medical, surgical, family, and social history reviewed and updated as indicated.  Allergies and medications reviewed and updated. Data reviewed: Chart in Epic.   Past Medical History:  Diagnosis Date   Anxiety    Headache    Missed abortion 05/2015   no surgery required   PONV (postoperative nausea and vomiting)    Stroke [facility])     Past Surgical History:  Procedure Laterality Date   CESAREAN SECTION N/A 09/12/2016   Procedure: CESAREAN SECTION;  Surgeon: Marylynn Pearson, MD;  Location: [place];  Service: Obstetrics;  Laterality: N/A;  RNFA   CESAREAN SECTION N/A 01/11/2019   Procedure: CESAREAN SECTION;  Surgeon: Marylynn Pearson, MD;  Location: [place];  Service: Obstetrics;  Laterality: N/A;  Repeat edc 01/15/19 NKDA [name] RNFA   DILATION AND EVACUATION N/A 12/23/2017   Procedure: DILATATION AND EVACUATION with Intraoperative Ultrasound ;  Surgeon: Marylynn Pearson, MD;  Location: [facility];  Service: Gynecology;  Laterality: N/A;  WISDOM TOOTH EXTRACTION      Social History   Socioeconomic History   Marital status: Married    Spouse name: Not on file   Number of children: Not on file   Years of education: Not on file   Highest education level: Not on file  Occupational History   Not on file  Tobacco Use   Smoking status: Never   Smokeless tobacco: Never  Vaping Use   Vaping Use: Never used  Substance and Sexual Activity   Alcohol use: No   Drug use: No   Sexual activity: Not on file    Comment: approx 12-[redacted] wks gestation per patient 12/22/17  Other Topics Concern   Not on file  Social History Narrative   Not on file   Social Determinants of Health   Financial Resource Strain: Not on file  Food Insecurity: Not on file  Transportation  Needs: Not on file  Physical Activity: Not on file  Stress: Not on file  Social Connections: Not on file  Intimate Partner Violence: Not on file    Outpatient Encounter Medications as of 09/06/2021  Medication Sig   acetaminophen (TYLENOL) 325 MG tablet Take by mouth.   aspirin 81 MG EC tablet Take by mouth.   cetirizine (ZYRTEC) 10 MG tablet Take by mouth.   Prenatal Vit-Fe Fumarate-FA (PRENATAL 1+1 PO) Prenatal   rosuvastatin (CRESTOR) 20 MG tablet Take 1 tablet by mouth daily.   [DISCONTINUED] aspirin 81 MG EC tablet Take 1 tablet by mouth daily.   [DISCONTINUED] ibuprofen (ADVIL) 600 MG tablet Take by mouth.   [DISCONTINUED] sertraline (ZOLOFT) 25 MG tablet Take 25 mg by mouth daily.   sertraline (ZOLOFT) 50 MG tablet Take 1 tablet (50 mg total) by mouth daily.   [DISCONTINUED] amoxicillin-clavulanate (AUGMENTIN) 875-125 MG tablet Take 1 tablet by mouth 2 (two) times daily.   [DISCONTINUED] enoxaparin (LOVENOX) 100 MG/ML injection Inject into the skin.   [DISCONTINUED] sertraline (ZOLOFT) 50 MG tablet Take 50 mg by mouth daily.   No facility-administered encounter medications on file as of 09/06/2021.    No Known Allergies  Review of Systems  Constitutional:  Negative for activity change, appetite change, chills, diaphoresis, fatigue, fever and unexpected weight change.  HENT: Negative.    Eyes:  Positive for visual disturbance (residual from recent CVA).  Respiratory:  Negative for cough, chest tightness and shortness of breath.   Cardiovascular:  Negative for chest pain, palpitations and leg swelling.  Gastrointestinal:  Negative for abdominal pain, blood in stool, constipation, diarrhea, nausea and vomiting.  Endocrine: Negative.   Genitourinary:  Negative for dysuria, frequency and urgency.  Musculoskeletal:  Positive for myalgias and neck stiffness. Negative for arthralgias, back pain, gait problem, joint swelling and neck pain.  Skin: Negative.   Allergic/Immunologic:  Negative.   Neurological:  Negative for dizziness, tremors, seizures, syncope, facial asymmetry, speech difficulty, weakness, light-headedness, numbness and headaches.  Hematological: Negative.   Psychiatric/Behavioral:  Positive for decreased concentration, dysphoric mood and sleep disturbance. Negative for agitation, behavioral problems, confusion, hallucinations, self-injury and suicidal ideas. The patient is nervous/anxious. The patient is not hyperactive.   [name] other systems reviewed and are negative.      Objective:  BP 102/63   Pulse 84   Temp 97.7 F (36.5 C)   Ht _0  (1.626 m)   Wt 177 lb (80.3 kg)   LMP 07/12/2020 (Approximate)   SpO2 94%   BMI 30.38 kg/m    Wt Readings from Last 3  Encounters:  09/06/21 177 lb (80.3 kg)  04/16/21 166 lb (75.3 kg)  05/30/20 185 lb 9.6 oz (84.2 kg)    Physical Exam Vitals and nursing note reviewed.  Constitutional:      General: She is not in acute distress.    Appearance: Normal appearance. She is well-developed and well-groomed. She is obese. She is not ill-appearing, toxic-appearing or diaphoretic.  HENT:     Head: Normocephalic and atraumatic.     Jaw: There is normal jaw occlusion.     Right Ear: Hearing normal.     Left Ear: Hearing normal.     Nose: Nose normal.     Mouth/Throat:     Lips: Pink.     Mouth: Mucous membranes are moist.     Pharynx: Oropharynx is clear. Uvula midline.  Eyes:     General: Lids are normal.     Extraocular Movements: Extraocular movements intact.     Conjunctiva/sclera: Conjunctivae normal.     Pupils: Pupils are equal, round, and reactive to light.  Neck:     Thyroid: No thyroid mass, thyromegaly or thyroid tenderness.     Vascular: No carotid bruit or JVD.     Trachea: Trachea and phonation normal.  Cardiovascular:     Rate and Rhythm: Normal rate and regular rhythm.     Chest Wall: PMI is not displaced.     Pulses: Normal pulses.     Heart sounds: Normal heart sounds. No murmur  heard.   No friction rub. No gallop.  Pulmonary:     Effort: Pulmonary effort is normal. No respiratory distress.     Breath sounds: Normal breath sounds. No wheezing.  Abdominal:     General: There is no abdominal bruit.     Palpations: Abdomen is soft. There is no hepatomegaly or splenomegaly.  Musculoskeletal:        General: Normal range of motion.       Arms:     Cervical back: Normal range of motion and neck supple. Spasms present. No swelling, edema, deformity, erythema, signs of trauma, lacerations, rigidity, torticollis, tenderness, bony tenderness or crepitus. No pain with movement. Normal range of motion.     Thoracic back: Normal.       Back:     Right lower leg: No edema.     Left lower leg: No edema.  Lymphadenopathy:     Cervical: No cervical adenopathy.  Skin:    General: Skin is warm and dry.     Capillary Refill: Capillary refill takes less than 2 seconds.     Coloration: Skin is not cyanotic, jaundiced or pale.     Findings: No rash.  Neurological:     General: No focal deficit present.     Mental Status: She is alert and oriented to person, place, and time.     Cranial Nerves: Cranial nerves are intact.     Sensory: Sensation is intact.     Motor: Motor function is intact.     Coordination: Coordination is intact.     Gait: Gait is intact.     Deep Tendon Reflexes: Reflexes are normal and symmetric.  Psychiatric:        Attention and Perception: Attention and perception normal.        Mood and Affect: Affect normal. Mood is anxious.        Speech: Speech normal.        Behavior: Behavior normal. Behavior is cooperative.  Thought Content: Thought content normal.        Cognition and Memory: Cognition and memory normal.        Judgment: Judgment normal.    Results for orders placed or performed in visit on 04/16/21  Urine Culture   Specimen: Urine   UR  Result Value Ref Range   Urine Culture, Routine Final report    Organism ID, Bacteria  Comment   Microscopic Examination   Urine  Result Value Ref Range   WBC, UA None seen 0 - 5 /hpf   RBC 0-2 0 - 2 /hpf   Epithelial Cells (non renal) 0-10 0 - 10 /hpf   Bacteria, UA None seen None seen/Few  Urinalysis, Complete  Result Value Ref Range   Specific Gravity, UA 1.010 1.005 - 1.030   pH, UA 7.0 5.0 - 7.5   Color, UA Yellow Yellow   Appearance Ur Clear Clear   Leukocytes,UA Negative Negative   Protein,UA Negative Negative/Trace   Glucose, UA Negative Negative   Ketones, UA Negative Negative   RBC, UA Trace (A) Negative   Bilirubin, UA Negative Negative   Urobilinogen, Ur 0.2 0.2 - 1.0 mg/dL   Nitrite, UA Negative Negative   Microscopic Examination See below:        Pertinent labs & imaging results that were available during my care of the patient were reviewed by me and considered in my medical decision making.  Assessment & Plan:  Kelly [name] was seen today for strained neck muscle.  Diagnoses and [name] orders for this visit:  GAD (generalized anxiety disorder) Recent health issues have increased anxiety. Will check below labs to determine possible underlying causes of increased anxiety. Pt has not been taking SSRI as prescribed. Long discussion about medication mechanism of action and proper dosing. Pt provided crisis hotline numbers. Aware to report any new, worsening, or persistent symptoms.  -     sertraline (ZOLOFT) 50 MG tablet; Take 1 tablet (50 mg total) by mouth daily. -     CBC with Differential/Platelet -     CMP14+EGFR -     Thyroid Panel With TSH -     VITAMIN D 25 Hydroxy (Vit-D Deficiency, Fractures)  PFO (patent foramen ovale) History of CVA (cerebrovascular accident) Followed by neurology and cardiology on a regular basis. Pt is discussing options for PFO treatment with cardiology and may undergo closure soon. This has increased her anxiety.  -     CBC with Differential/Platelet -     CMP14+EGFR  History of gestational diabetes Will check labs  today. Denies recurrent symptoms since delivery.  -     CBC with Differential/Platelet -     CMP14+EGFR -     Thyroid Panel With TSH -     VITAMIN D 25 Hydroxy (Vit-D Deficiency, Fractures)   Muscle spasm of left trapezius Symptomatic care discussed in detail. Report new, worsening, or persistent symptoms. Topical rubs such as Biofreeze will be beneficial.   Continue [name] other maintenance medications.  Follow up plan: Return in about 6 weeks (around 10/18/2021), or if symptoms worsen or fail to improve, for GAD.   Continue healthy lifestyle choices, including diet (rich in fruits, vegetables, and lean proteins, and low in salt and simple carbohydrates) and exercise (at least 30 minutes of moderate physical activity daily).  Educational handout given for depression  The above assessment and management plan was discussed with the patient. The patient verbalized understanding of and has agreed to the management plan. Patient is  aware to call the clinic if they develop any new symptoms or if symptoms persist or worsen. Patient is aware when to return to the clinic for a follow-up visit. Patient educated on when it is appropriate to go to the emergency department.   Monia Pouch, [name]-C [facility] [phone]

## 2021-09-06 NOTE — Patient Instructions (Signed)

## 2021-09-07 LAB — CBC WITH DIFFERENTIAL/PLATELET
Basophils Absolute: 0.1 10*3/uL (ref 0.0–0.2)
Basos: 1 %
EOS (ABSOLUTE): 0.3 10*3/uL (ref 0.0–0.4)
Eos: 3 %
Hematocrit: 42.9 % (ref 34.0–46.6)
Hemoglobin: 14.3 g/dL (ref 11.1–15.9)
Immature Grans (Abs): 0 10*3/uL (ref 0.0–0.1)
Immature Granulocytes: 0 %
Lymphocytes Absolute: 1.9 10*3/uL (ref 0.7–3.1)
Lymphs: 21 %
MCH: 31 pg (ref 26.6–33.0)
MCHC: 33.3 g/dL (ref 31.5–35.7)
MCV: 93 fL (ref 79–97)
Monocytes Absolute: 0.6 10*3/uL (ref 0.1–0.9)
Monocytes: 7 %
Neutrophils Absolute: 6.1 10*3/uL (ref 1.4–7.0)
Neutrophils: 68 %
Platelets: 251 10*3/uL (ref 150–450)
RBC: 4.62 x10E6/uL (ref 3.77–5.28)
RDW: 12.7 % (ref 11.7–15.4)
WBC: 9.1 10*3/uL (ref 3.4–10.8)

## 2021-09-07 LAB — CMP14+EGFR
ALT: 24 IU/L (ref 0–32)
AST: 19 IU/L (ref 0–40)
Albumin/Globulin Ratio: 2 (ref 1.2–2.2)
Albumin: 4.8 g/dL (ref 3.8–4.8)
Alkaline Phosphatase: 96 IU/L (ref 44–121)
BUN/Creatinine Ratio: 25 — ABNORMAL HIGH (ref 9–23)
BUN: 14 mg/dL (ref 6–20)
Bilirubin Total: 0.7 mg/dL (ref 0.0–1.2)
CO2: 21 mmol/L (ref 20–29)
Calcium: 9.5 mg/dL (ref 8.7–10.2)
Chloride: 102 mmol/L (ref 96–106)
Creatinine, Ser: 0.57 mg/dL (ref 0.57–1.00)
Globulin, Total: 2.4 g/dL (ref 1.5–4.5)
Glucose: 87 mg/dL (ref 70–99)
Potassium: 4.2 mmol/L (ref 3.5–5.2)
Sodium: 140 mmol/L (ref 134–144)
Total Protein: 7.2 g/dL (ref 6.0–8.5)
eGFR: 121 mL/min/{1.73_m2} (ref >59)

## 2021-09-07 LAB — THYROID PANEL WITH TSH
Free Thyroxine Index: 2.2 (ref 1.2–4.9)
T3 Uptake Ratio: 25 % (ref 24–39)
T4, Total: 8.8 ug/dL (ref 4.5–12.0)
TSH: 1.3 u[IU]/mL (ref 0.450–4.500)

## 2021-09-07 LAB — VITAMIN D 25 HYDROXY (VIT D DEFICIENCY, FRACTURES): Vit D, 25-Hydroxy: 27.3 ng/mL — ABNORMAL LOW (ref 30.0–100.0)

## 2021-10-08 ENCOUNTER — Telehealth: Payer: Self-pay | Admitting: Family Medicine

## 2021-10-08 NOTE — Telephone Encounter (Signed)
Appt on 10/09/21

## 2021-10-08 NOTE — Telephone Encounter (Signed)
mother calling for pt. She is concerned about about ankle swollen. pt had a stroke not to long ago and wants to make sure this is ok. Mother is asking for an apt. I told her we were booked then she asked if she can atleast talk to a nurse. Mother asked for nurse to call pt back.. Please call back

## 2021-10-08 NOTE — Telephone Encounter (Signed)
Spoke with patient and she said someone has already called and scheduled her an appointment tomorrow.  Woke up with pain and swelling in ankle, no known injury.

## 2021-10-09 ENCOUNTER — Other Ambulatory Visit: Payer: Self-pay

## 2021-10-09 ENCOUNTER — Encounter: Payer: Self-pay | Admitting: Family Medicine

## 2021-10-09 ENCOUNTER — Ambulatory Visit: Payer: BC Managed Care – PPO | Admitting: Family Medicine

## 2021-10-09 VITALS — BP 102/66 | HR 85 | Temp 97.7°F | Ht 64.0 in | Wt 180.4 lb

## 2021-10-09 DIAGNOSIS — R2241 Localized swelling, mass and lump, right lower limb: Secondary | ICD-10-CM

## 2021-10-09 DIAGNOSIS — M79671 Pain in right foot: Secondary | ICD-10-CM | POA: Diagnosis not present

## 2021-10-09 NOTE — Patient Instructions (Signed)
Patient is scheduled for tomorrow at 10:30 AM

## 2021-10-09 NOTE — Progress Notes (Signed)
Assessment & Plan:  1-2. Localized swelling of right foot/Pain in right foot U/S ordered STAT. Patient is scheduled for tomorrow morning.  - US Venous Img Lower Unilateral Right; Future   Follow up plan: Return if symptoms worsen or fail to improve.  Floy Sabina, NP Student  I personally was present during the history, physical exam, and medical decision-making activities of this service and have verified that the service and findings are accurately documented in the nurse practitioner student's note.  [name], MSN, APRN, [name]-C [facility]   Subjective:   Patient ID: Kelly [name], female    DOB: 01/23/1986, 35 y.o.   MRN: [id number]  HPI: Kelly [name] is a 35 y.o. female presenting on 10/09/2021 for Ankle Pain (Right x 1 day)   She states she has not hurt her ankle doing any activities specifically, but she is on her feet most of the day. She states with her history of stroke last year she is more aware of her body and abnormal symptoms trigger her anxiety. She reports the pain as achy in the top of her foot and is swollen. She is concerned about a blood clot since she had one while pregnant and her ankle was swollen the week before her stroke. She is taking a daily baby aspirin for anticoagulation.    ROS: Negative unless specifically indicated above in HPI.   Relevant past medical history reviewed and updated as indicated.   Allergies and medications reviewed and updated.   Current Outpatient Medications:    acetaminophen (TYLENOL) 325 MG tablet, Take by mouth., Disp: , Rfl:    aspirin 81 MG EC tablet, Take by mouth., Disp: , Rfl:    cetirizine (ZYRTEC) 10 MG tablet, Take by mouth., Disp: , Rfl:    Prenatal Vit-Fe Fumarate-FA (PRENATAL 1+1 PO), Prenatal, Disp: , Rfl:    rosuvastatin (CRESTOR) 20 MG tablet, Take 1 tablet by mouth daily., Disp: , Rfl:    sertraline (ZOLOFT) 50 MG tablet, Take 1 tablet (50 mg total) by mouth  daily., Disp: 30 tablet, Rfl: 5  No Known Allergies  Objective:   BP 102/66   Pulse 85   Temp 97.7 F (36.5 C) (Temporal)   Ht 5\' 4"  (1.626 m)   Wt 81.8 kg   BMI 30.97 kg/m    Physical Exam Vitals reviewed.  Constitutional:      General: She is not in acute distress.    Appearance: Normal appearance. She is obese. She is not ill-appearing, toxic-appearing or diaphoretic.  HENT:     Head: Normocephalic and atraumatic.  Eyes:     General: No scleral icterus.       Right eye: No discharge.        Left eye: No discharge.     Conjunctiva/sclera: Conjunctivae normal.  Cardiovascular:     Rate and Rhythm: Normal rate.  Pulmonary:     Effort: Pulmonary effort is normal. No respiratory distress.  Musculoskeletal:        General: Normal range of motion.     Cervical back: Normal range of motion.     Right ankle: Normal.     Right foot: Swelling and tenderness present.  Skin:    General: Skin is warm and dry.     Capillary Refill: Capillary refill takes less than 2 seconds.  Neurological:     General: No focal deficit present.     Mental Status: She is alert and oriented to person, place, and time.  Mental status is at baseline.  Psychiatric:        Mood and Affect: Mood normal.        Behavior: Behavior normal.        Thought Content: Thought content normal.        Judgment: Judgment normal.

## 2021-10-10 ENCOUNTER — Ambulatory Visit (HOSPITAL_COMMUNITY)
Admission: RE | Admit: 2021-10-10 | Discharge: 2021-10-10 | Disposition: A | Discharge status: Home / Self Care | Payer: BC Managed Care – PPO | Source: Ambulatory Visit | Attending: Family Medicine | Admitting: Family Medicine

## 2021-10-10 DIAGNOSIS — R2241 Localized swelling, mass and lump, right lower limb: Secondary | ICD-10-CM | POA: Diagnosis present

## 2021-10-10 DIAGNOSIS — M79671 Pain in right foot: Secondary | ICD-10-CM | POA: Insufficient documentation

## 2021-10-18 ENCOUNTER — Encounter: Payer: Self-pay | Admitting: Family Medicine

## 2021-10-18 ENCOUNTER — Ambulatory Visit (INDEPENDENT_AMBULATORY_CARE_PROVIDER_SITE_OTHER): Payer: BC Managed Care – PPO | Admitting: Family Medicine

## 2021-10-18 DIAGNOSIS — J0100 Acute maxillary sinusitis, unspecified: Secondary | ICD-10-CM

## 2021-10-18 DIAGNOSIS — J111 Influenza due to unidentified influenza virus with other respiratory manifestations: Secondary | ICD-10-CM

## 2021-10-18 MED ORDER — OSELTAMIVIR PHOSPHATE 75 MG PO CAPS: 75.0000 mg | ORAL_CAPSULE | Freq: Two times a day (BID) | ORAL | 0 refills | Status: DC

## 2021-10-18 MED ORDER — AMOXICILLIN-POT CLAVULANATE 875-125 MG PO TABS: 1.0000 | ORAL_TABLET | Freq: Two times a day (BID) | ORAL | 0 refills | Status: DC

## 2021-10-18 NOTE — Progress Notes (Signed)
Subjective:    Patient ID: Kelly [name], female    DOB: 18-Aug-1986, 35 y.o.   MRN: [id number]   HPI: Kelly [name] is a 35 y.o. female presenting for exposure to flu. Taking tamiflu. Started it on 11/12 PM. Mess has settled into her head. Sore in cheeks. Teeth hurting. Drainage posteriorly. Some purulent rhinorrhea. No fever. Has frequent sinus infections. A little cough.   Depression screen [facility] 2/9 10/09/2021 09/06/2021 04/16/2021 05/30/2020 02/23/2018  Decreased Interest 0 0 0 0 0  Down, Depressed, Hopeless 0 1 0 0 0  PHQ - 2 Score 0 1 0 0 0  Altered sleeping 0 1 - - -  Tired, decreased energy 1 0 - - -  Change in appetite 0 0 - - -  Feeling bad or failure about yourself  0 0 - - -  Trouble concentrating 0 0 - - -  Moving slowly or fidgety/restless 0 0 - - -  Suicidal thoughts 0 0 - - -  PHQ-9 Score 1 2 - - -  Difficult doing work/chores Not difficult at all Not difficult at all - - -     Relevant past medical, surgical, family and social history reviewed and updated as indicated.  Interim medical history since our last visit reviewed. Allergies and medications reviewed and updated.  ROS:  Review of Systems  Constitutional:  Negative for activity change, appetite change, chills and fever.  HENT:  Positive for congestion, postnasal drip, rhinorrhea and sinus pressure. Negative for ear discharge, ear pain, hearing loss, nosebleeds, sneezing and trouble swallowing.   Respiratory:  Negative for chest tightness and shortness of breath.   Cardiovascular:  Negative for chest pain and palpitations.  Skin:  Negative for rash.    Social History   Tobacco Use  Smoking Status Never  Smokeless Tobacco Never       Objective:     Wt Readings from Last 3 Encounters:  10/09/21 180 lb 6.4 oz (81.8 kg)  09/06/21 177 lb (80.3 kg)  04/16/21 166 lb (75.3 kg)     Exam deferred. Pt. Harboring due to COVID 19. Phone visit performed.   Assessment & Plan:   1.  Influenza with respiratory manifestation   2. Acute maxillary sinusitis, recurrence not specified     Meds ordered this encounter  Medications   amoxicillin-clavulanate (AUGMENTIN) 875-125 MG tablet    Sig: Take 1 tablet by mouth 2 (two) times daily. Take all of this medication    Dispense:  20 tablet    Refill:  0   oseltamivir (TAMIFLU) 75 MG capsule    Sig: Take 1 capsule (75 mg total) by mouth 2 (two) times daily.    Dispense:  10 capsule    Refill:  0    No orders of the defined types were placed in this encounter.     Diagnoses and all orders for this visit:  Influenza with respiratory manifestation  Acute maxillary sinusitis, recurrence not specified  Other orders -     amoxicillin-clavulanate (AUGMENTIN) 875-125 MG tablet; Take 1 tablet by mouth 2 (two) times daily. Take all of this medication -     oseltamivir (TAMIFLU) 75 MG capsule; Take 1 capsule (75 mg total) by mouth 2 (two) times daily.   Virtual Visit via telephone Note  I discussed the limitations, risks, security and privacy concerns of performing an evaluation and management service by telephone and the availability of in person appointments. The patient was identified with two  identifiers. Pt.expressed understanding and agreed to proceed. Pt. Is at home. Dr. Darlyn Read is in his office.  Follow Up Instructions:   I discussed the assessment and treatment plan with the patient. The patient was provided an opportunity to ask questions and all were answered. The patient agreed with the plan and demonstrated an understanding of the instructions.   The patient was advised to call back or seek an in-person evaluation if the symptoms worsen or if the condition fails to improve as anticipated.   Total minutes including chart review and phone contact time: 13   Follow up plan: Return if symptoms worsen or fail to improve.  Mechele Claude, MD [facility]

## 2021-10-23 ENCOUNTER — Ambulatory Visit: Payer: BC Managed Care – PPO | Admitting: Family Medicine

## 2021-10-23 ENCOUNTER — Encounter: Payer: Self-pay | Admitting: Family Medicine

## 2021-10-23 ENCOUNTER — Other Ambulatory Visit: Payer: Self-pay

## 2021-10-23 VITALS — BP 99/56 | HR 80 | Temp 98.5°F | Ht 64.0 in | Wt 181.8 lb

## 2021-10-23 DIAGNOSIS — J310 Chronic rhinitis: Secondary | ICD-10-CM

## 2021-10-23 DIAGNOSIS — F411 Generalized anxiety disorder: Secondary | ICD-10-CM

## 2021-10-23 DIAGNOSIS — Q2112 Patent foramen ovale: Secondary | ICD-10-CM

## 2021-10-23 DIAGNOSIS — Z8673 Personal history of transient ischemic attack (TIA), and cerebral infarction without residual deficits: Secondary | ICD-10-CM | POA: Diagnosis not present

## 2021-10-23 DIAGNOSIS — J329 Chronic sinusitis, unspecified: Secondary | ICD-10-CM

## 2021-10-23 MED ORDER — AZELASTINE HCL 0.1 % NA SOLN: 1.0000 | Freq: Two times a day (BID) | NASAL | 12 refills | Status: DC

## 2021-10-23 NOTE — Progress Notes (Signed)
Subjective: CC: Follow-up anxiety disorder, recent illness PCP: Raliegh Ip, DO [id number] Kelly [name] is a 35 y.o. female presenting to clinic today for:  1.  Anxiety disorder Patient was advised to start taking Zoloft 50 mg back in October.  She notes that the 50 mg caused some intermittent wheezy headedness and therefore she reduced down to 25 mg.  She does feel that this is helping with her anxiety symptoms and is pleased with the dose at this time.  2.  Recent influenza/bacterial sinusitis Patient reports she has improving facial pressure.  She has ongoing postnasal drainage causing intermittent irritation of the throat.  No fevers.  Overall feeling somewhat better but has not completed her antibiotic.  She is status post treatment with Tamiflu prophylaxis though she did end up getting influenza anyway.  She did not want using Zyrtec that she has on hand due to issues with drying of her breastmilk.  She is not utilizing any nasal sprays.  Denies any nasal congestion.  3.  History of CVA Patient notes that her blood pressure has been on the lower side since the CVA but she is not on any antihypertensives.  She is compliant with her aspirin and cholesterol medicine.  She notes that there is plan for echocardiogram in the spring.   ROS: Per HPI  No Known Allergies Past Medical History:  Diagnosis Date   Anxiety    Headache    Missed abortion 05/2015   no surgery required   PONV (postoperative nausea and vomiting)    Stroke [facility])     Current Outpatient Medications:    acetaminophen (TYLENOL) 325 MG tablet, Take by mouth., Disp: , Rfl:    amoxicillin-clavulanate (AUGMENTIN) 875-125 MG tablet, Take 1 tablet by mouth 2 (two) times daily. Take all of this medication, Disp: 20 tablet, Rfl: 0   aspirin 81 MG EC tablet, Take by mouth., Disp: , Rfl:    Prenatal Vit-Fe Fumarate-FA (PRENATAL 1+1 PO), Prenatal, Disp: , Rfl:    rosuvastatin (CRESTOR) 20 MG tablet, Take 1 tablet  by mouth daily., Disp: , Rfl:    sertraline (ZOLOFT) 50 MG tablet, Take 1 tablet (50 mg total) by mouth daily., Disp: 30 tablet, Rfl: 5   cetirizine (ZYRTEC) 10 MG tablet, Take by mouth. (Patient not taking: Reported on 10/23/2021), Disp: , Rfl:  Social History   Socioeconomic History   Marital status: Married    Spouse name: Not on file   Number of children: Not on file   Years of education: Not on file   Highest education level: Not on file  Occupational History   Not on file  Tobacco Use   Smoking status: Never   Smokeless tobacco: Never  Vaping Use   Vaping Use: Never used  Substance and Sexual Activity   Alcohol use: No   Drug use: No   Sexual activity: Not on file    Comment: approx 12-[redacted] wks gestation per patient 12/22/17  Other Topics Concern   Not on file  Social History Narrative   Not on file   Social Determinants of Health   Financial Resource Strain: Not on file  Food Insecurity: Not on file  Transportation Needs: Not on file  Physical Activity: Not on file  Stress: Not on file  Social Connections: Not on file  Intimate Partner Violence: Not on file   Family History  Problem Relation Age of Onset   Healthy Mother    Cancer Father  skin   Cancer Other    Cancer Maternal Aunt    Hypertension Maternal Uncle    Hypertension Maternal Grandfather     Objective: Office vital signs reviewed. BP (!) 99/56   Pulse 80   Temp 98.5 F (36.9 C) (Temporal)   Ht 5\' 4"  (1.626 m)   Wt 181 lb 12.8 oz (82.5 kg)   BMI 31.21 kg/m   Physical Examination:  General: Awake, alert, well nourished, No acute distress HEENT: TMs with fluid appreciated behind the membrane.  This is clear fluid.  Very mild bulging but no perforation, erythema.  Nasal turbinates are erythematous but not edematous.  Nonpurulent nasal discharge appreciated.  Cobblestone appearance of the oropharynx noted. Cardio: regular rate and rhythm, S1S2 heard, no murmurs appreciated Pulm: clear to  auscultation bilaterally, no wheezes, rhonchi or rales; normal work of breathing on room air Psych: Mood stable, speech normal, affect appropriate.  Patient is pleasant, interactive.  Linear thought process.  Good eye contact Depression screen [facility] 2/9 10/23/2021 10/09/2021 09/06/2021  Decreased Interest 0 0 0  Down, Depressed, Hopeless 0 0 1  PHQ - 2 Score 0 0 1  Altered sleeping 0 0 1  Tired, decreased energy 1 1 0  Change in appetite 1 0 0  Feeling bad or failure about yourself  0 0 0  Trouble concentrating 0 0 0  Moving slowly or fidgety/restless 0 0 0  Suicidal thoughts 0 0 0  PHQ-9 Score 2 1 2   Difficult doing work/chores Not difficult at all Not difficult at all Not difficult at all   GAD 7 : Generalized Anxiety Score 10/23/2021 09/06/2021 04/16/2021 01/08/2016  Nervous, Anxious, on Edge 1 1 1 2   Control/stop worrying 1 1 0 0  Worry too much - different things 0 1 0 0  Trouble relaxing 0 1 0 0  Restless 0 0 0 0  Easily annoyed or irritable 0 0 0 3  Afraid - awful might happen 1 1 0 0  Total GAD 7 Score 3 5 1 5   Anxiety Difficulty Not difficult at all Not difficult at all Not difficult at all -    Assessment/ Plan: 35 y.o. female   Rhinosinusitis - Plan: azelastine (ASTELIN) 0.1 % nasal spray  GAD (generalized anxiety disorder)  PFO (patent foramen ovale)  History of CVA (cerebrovascular accident)  No evidence of bacterial infection on exam but she is almost done with her antibiotics have advised her to complete this.  I am adding Astelin nasal spray for the postnasal drip she is describing.  We considered use of Zyrtec or another systemic antihistamine but she unfortunately has some issues with drying up of her breast milk with these classes so topical was felt to be more appropriate in her situation.  Anxiety disorder stable with 25 mg of Zoloft.  She will contact me if symptoms become uncontrolled again and we can consider GeneSight testing in this patient since she has not  tolerated higher doses of Zoloft  Known PFO which is thought to be because of CVA.  She will be undergoing a TEE in the spring.  She may follow-up with me after that appointment for annual physical and ongoing chronic care management    No orders of the defined types were placed in this encounter.  No orders of the defined types were placed in this encounter.    , DO [facility] [phone]

## 2021-12-27 ENCOUNTER — Encounter: Payer: Self-pay | Admitting: Family Medicine

## 2021-12-27 ENCOUNTER — Ambulatory Visit: Payer: BC Managed Care – PPO | Admitting: Family Medicine

## 2021-12-27 DIAGNOSIS — J069 Acute upper respiratory infection, unspecified: Secondary | ICD-10-CM

## 2021-12-27 LAB — VERITOR FLU A/B WAIVED
Influenza A: NEGATIVE
Influenza B: NEGATIVE

## 2021-12-27 LAB — CULTURE, GROUP A STREP

## 2021-12-27 LAB — RAPID STREP SCREEN (MED CTR MEBANE ONLY): Strep Gp A Ag, IA W/Reflex: NEGATIVE

## 2021-12-27 MED ORDER — AMOXICILLIN 500 MG PO CAPS: 500.0000 mg | ORAL_CAPSULE | Freq: Two times a day (BID) | ORAL | 0 refills | Status: DC

## 2021-12-27 NOTE — Progress Notes (Signed)
Virtual Visit via telephone Note  I connected with Kelly [name] on 12/27/21 at 1526 by telephone and verified that I am speaking with the correct person using two identifiers. Kelly [name] is currently located at home and patient are currently with her during visit. The provider, Fransisca Kaufmann Venice Liz, MD is located in their office at time of visit.  Call ended at 1536  I discussed the limitations, risks, security and privacy concerns of performing an evaluation and management service by telephone and the availability of in person appointments. I also discussed with the patient that there may be a patient responsible charge related to this service. The patient expressed understanding and agreed to proceed.   History and Present Illness: Patient is calling in for congestion and sore throat and her child tested positive for strep. She denies fevers or chills.  She is having congestion and is not feeling well.  She is coughing and is sometimes productive.  She is not taking anything OTC yet.  She started with cough and congestion. 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   1. Upper respiratory tract infection, unspecified type     Outpatient Encounter Medications as of 12/27/2021  Medication Sig   acetaminophen (TYLENOL) 325 MG tablet Take by mouth.   aspirin 81 MG EC tablet Take by mouth.   azelastine (ASTELIN) 0.1 % nasal spray Place 1 spray into both nostrils 2 (two) times daily.   cetirizine (ZYRTEC) 10 MG tablet Take by mouth. (Patient not taking: Reported on 10/23/2021)   Prenatal Vit-Fe Fumarate-FA (PRENATAL 1+1 PO) Prenatal   rosuvastatin (CRESTOR) 20 MG tablet Take 1 tablet by mouth daily.   sertraline (ZOLOFT) 50 MG tablet Take 1 tablet (50 mg total) by mouth daily.   [DISCONTINUED] amoxicillin-clavulanate (AUGMENTIN) 875-125 MG tablet Take 1 tablet by mouth 2 (two) times daily. Take [name] of this medication   No facility-administered encounter  medications on file as of 12/27/2021.    Review of Systems  Constitutional:  Negative for chills and fever.  HENT:  Positive for congestion, postnasal drip, rhinorrhea, sinus pressure, sneezing and sore throat. Negative for ear discharge and ear pain.   Eyes:  Negative for pain, redness and visual disturbance.  Respiratory:  Positive for cough. Negative for chest tightness and shortness of breath.   Cardiovascular:  Negative for chest pain and leg swelling.  Genitourinary:  Negative for difficulty urinating and dysuria.  Musculoskeletal:  Negative for back pain and gait problem.  Skin:  Negative for rash.  Neurological:  Negative for light-headedness and headaches.  Psychiatric/Behavioral:  Negative for agitation and behavioral problems.   [name] other systems reviewed and are negative.  Observations/Objective: Patient sounds comfortable and in no acute distress  Assessment and Plan: Problem List Items Addressed This Visit   None Visit Diagnoses     Upper respiratory tract infection, unspecified type    -  Primary   Relevant Orders   Veritor Flu A/B Waived   Rapid Strep Screen ([facility] ONLY)   Novel Coronavirus, NAA ([facility])       Will test and then treat for what comes positive.  For now we will manage conservatively. Follow up plan: Return if symptoms worsen or fail to improve.     I discussed the assessment and treatment plan with the patient. The patient was provided an opportunity to ask questions and [name] were answered. The patient agreed with the plan and demonstrated an understanding of the  instructions.   The patient was advised to call back or seek an in-person evaluation if the symptoms worsen or if the condition fails to improve as anticipated.  The above assessment and management plan was discussed with the patient. The patient verbalized understanding of and has agreed to the management plan. Patient is aware to call the clinic if symptoms persist or worsen.  Patient is aware when to return to the clinic for a follow-up visit. Patient educated on when it is appropriate to go to the emergency department.    I provided 10 minutes of non-face-to-face time during this encounter.    Worthy Rancher, MD

## 2021-12-27 NOTE — Addendum Note (Signed)
Addended by: Arville Care on: 12/27/2021 04:29 PM   Modules accepted: Orders

## 2021-12-28 LAB — NOVEL CORONAVIRUS, NAA: SARS-CoV-2, NAA: NOT DETECTED

## 2021-12-28 LAB — SARS-COV-2, NAA 2 DAY TAT

## 2022-01-16 ENCOUNTER — Ambulatory Visit (INDEPENDENT_AMBULATORY_CARE_PROVIDER_SITE_OTHER): Payer: BC Managed Care – PPO | Admitting: Family Medicine

## 2022-01-16 ENCOUNTER — Encounter: Payer: Self-pay | Admitting: Family Medicine

## 2022-01-16 DIAGNOSIS — J069 Acute upper respiratory infection, unspecified: Secondary | ICD-10-CM

## 2022-01-16 MED ORDER — PREDNISONE 20 MG PO TABS: 40.0000 mg | ORAL_TABLET | Freq: Every day | ORAL | 0 refills | Status: AC

## 2022-01-16 MED ORDER — GUAIFENESIN ER 600 MG PO TB12: 600.0000 mg | ORAL_TABLET | Freq: Two times a day (BID) | ORAL | 0 refills | Status: AC

## 2022-01-16 NOTE — Progress Notes (Signed)
Virtual Visit via telephone Note Due to COVID-19 pandemic this visit was conducted virtually. This visit type was conducted due to national recommendations for restrictions regarding the COVID-19 Pandemic (e.g. social distancing, sheltering in place) in an effort to limit this patient's exposure and mitigate transmission in our community. All issues noted in this document were discussed and addressed.  A physical exam was not performed with this format.   I connected with Kelly [name] on 01/16/2022 at 0930 by telephone and verified that I am speaking with the correct person using two identifiers. Kelly [name] is currently located at home and patient is currently with them during visit. The provider, [name], [name] is located in their office at time of visit.  I discussed the limitations, risks, security and privacy concerns of performing an evaluation and management service by virtual visit and the availability of in person appointments. I also discussed with the patient that there may be a patient responsible charge related to this service. The patient expressed understanding and agreed to proceed.  Subjective:  Patient ID: Kelly [name], female    DOB: 01-Jan-1986, 36 y.o.   MRN: [id number]  Chief Complaint:  Cough and URI   HPI: Kelly [name] is a 36 y.o. female presenting on 01/16/2022 for Cough and URI   Pt presents today with complaints of cough and congestion. She denies fever, chills, recent sick exposures, fatigue, weakness, or malaise. She was treated for bacterial URI last month.   Cough This is a new problem. The current episode started yesterday. The problem has been waxing and waning. The problem occurs every few minutes. The cough is Non-productive. Associated symptoms include nasal congestion and wheezing. Pertinent negatives include no chest pain, chills, ear congestion, ear pain, fever, headaches, heartburn, hemoptysis, myalgias,  postnasal drip, rash, rhinorrhea, sore throat, shortness of breath, sweats or weight loss. She has tried nothing for the symptoms.  URI  This is a new problem. The current episode started yesterday. The problem has been waxing and waning. There has been no fever. Associated symptoms include congestion, coughing and wheezing. Pertinent negatives include no abdominal pain, chest pain, diarrhea, dysuria, ear pain, headaches, joint pain, joint swelling, nausea, neck pain, plugged ear sensation, rash, rhinorrhea, sinus pain, sneezing, sore throat, swollen glands or vomiting. She has tried nothing for the symptoms.    Relevant past medical, surgical, family, and social history reviewed and updated as indicated.  Allergies and medications reviewed and updated.   Past Medical History:  Diagnosis Date   Anxiety    Headache    Missed abortion 05/2015   no surgery required   PONV (postoperative nausea and vomiting)    Stroke [facility])     Past Surgical History:  Procedure Laterality Date   CESAREAN SECTION N/A 09/12/2016   Procedure: CESAREAN SECTION;  Surgeon: Zelphia Cairo, MD;  Location: [facility] BIRTHING SUITES;  Service: Obstetrics;  Laterality: N/A;  RNFA   CESAREAN SECTION N/A 01/11/2019   Procedure: CESAREAN SECTION;  Surgeon: Zelphia Cairo, MD;  Location: [facility] BIRTHING SUITES;  Service: Obstetrics;  Laterality: N/A;  Repeat edc 01/15/19 NKDA [name] RNFA   DILATION AND EVACUATION N/A 12/23/2017   Procedure: DILATATION AND EVACUATION with Intraoperative Ultrasound ;  Surgeon: Zelphia Cairo, MD;  Location: [facility] ORS;  Service: Gynecology;  Laterality: N/A;   WISDOM TOOTH EXTRACTION      Social History   Socioeconomic History   Marital status: Married    Spouse name: Not on file  Number of children: Not on file   Years of education: Not on file   Highest education level: Not on file  Occupational History   Not on file  Tobacco Use   Smoking status: Never   Smokeless tobacco: Never   Vaping Use   Vaping Use: Never used  Substance and Sexual Activity   Alcohol use: No   Drug use: No   Sexual activity: Not on file    Comment: approx 12-[redacted] wks gestation per patient 12/22/17  Other Topics Concern   Not on file  Social History Narrative   Not on file   Social Determinants of Health   Financial Resource Strain: Not on file  Food Insecurity: Not on file  Transportation Needs: Not on file  Physical Activity: Not on file  Stress: Not on file  Social Connections: Not on file  Intimate Partner Violence: Not on file    Outpatient Encounter Medications as of 01/16/2022  Medication Sig   guaiFENesin (MUCINEX) 600 MG 12 hr tablet Take 1 tablet (600 mg total) by mouth 2 (two) times daily for 10 days.   predniSONE (DELTASONE) 20 MG tablet Take 2 tablets (40 mg total) by mouth daily with breakfast for 5 days.   acetaminophen (TYLENOL) 325 MG tablet Take by mouth.   amoxicillin (AMOXIL) 500 MG capsule Take 1 capsule (500 mg total) by mouth 2 (two) times daily.   aspirin 81 MG EC tablet Take by mouth.   azelastine (ASTELIN) 0.1 % nasal spray Place 1 spray into both nostrils 2 (two) times daily.   cetirizine (ZYRTEC) 10 MG tablet Take by mouth. (Patient not taking: Reported on 10/23/2021)   Prenatal Vit-Fe Fumarate-FA (PRENATAL 1+1 PO) Prenatal   rosuvastatin (CRESTOR) 20 MG tablet Take 1 tablet by mouth daily.   sertraline (ZOLOFT) 50 MG tablet Take 1 tablet (50 mg total) by mouth daily.   No facility-administered encounter medications on file as of 01/16/2022.    No Known Allergies  Review of Systems  Constitutional:  Negative for activity change, appetite change, chills, diaphoresis, fever, unexpected weight change and weight loss.  HENT:  Positive for congestion. Negative for dental problem, drooling, ear discharge, ear pain, facial swelling, hearing loss, mouth sores, nosebleeds, postnasal drip, rhinorrhea, sinus pressure, sinus pain, sneezing, sore throat, tinnitus,  trouble swallowing and voice change.   Respiratory:  Positive for cough and wheezing. Negative for apnea, hemoptysis, choking, chest tightness, shortness of breath and stridor.   Cardiovascular:  Negative for chest pain, palpitations and leg swelling.  Gastrointestinal:  Negative for abdominal pain, diarrhea, heartburn, nausea and vomiting.  Genitourinary:  Negative for decreased urine volume, difficulty urinating and dysuria.  Musculoskeletal:  Negative for arthralgias, joint pain, myalgias and neck pain.  Skin:  Negative for rash.  Neurological:  Negative for dizziness, tremors, seizures, syncope, facial asymmetry, speech difficulty, weakness, light-headedness, numbness and headaches.  Psychiatric/Behavioral:  Negative for confusion.   All other systems reviewed and are negative.       Observations/Objective: No vital signs or physical exam, this was a virtual health encounter.  Pt alert and oriented, answers all questions appropriately, and able to speak in full sentences.    Assessment and Plan: Kelly [name] was seen today for cough and uri.  Diagnoses and all orders for this visit:  URI with cough and congestion No indications of acute bacterial infection. COVID test at home negative. Will treat symptomatically. Mucinex and prednisone as prescribed. Tylenol or motrin as needed for fever and pain control.  Report any new, worsening, or persistent symptoms. Follow up as needed.  -     guaiFENesin (MUCINEX) 600 MG 12 hr tablet; Take 1 tablet (600 mg total) by mouth 2 (two) times daily for 10 days. -     predniSONE (DELTASONE) 20 MG tablet; Take 2 tablets (40 mg total) by mouth daily with breakfast for 5 days.     Follow Up Instructions: Return if symptoms worsen or fail to improve.    I discussed the assessment and treatment plan with the patient. The patient was provided an opportunity to ask questions and all were answered. The patient agreed with the plan and demonstrated an  understanding of the instructions.   The patient was advised to call back or seek an in-person evaluation if the symptoms worsen or if the condition fails to improve as anticipated.  The above assessment and management plan was discussed with the patient. The patient verbalized understanding of and has agreed to the management plan. Patient is aware to call the clinic if they develop any new symptoms or if symptoms persist or worsen. Patient is aware when to return to the clinic for a follow-up visit. Patient educated on when it is appropriate to go to the emergency department.    I provided 15 minutes of time during this telephone encounter.   [name], [name]-C [facility] [address] [place], [place] [address] [phone] 01/16/2022

## 2022-01-24 ENCOUNTER — Telehealth: Payer: Self-pay | Admitting: Family Medicine

## 2022-01-24 NOTE — Telephone Encounter (Signed)
Had a visit with Rakes- please advise

## 2022-01-24 NOTE — Telephone Encounter (Signed)
Need an in office visit as you need to be evaluated in person due to ongoing symptoms.

## 2022-01-25 ENCOUNTER — Ambulatory Visit: Payer: BC Managed Care – PPO | Admitting: Family Medicine

## 2022-01-25 ENCOUNTER — Encounter: Payer: Self-pay | Admitting: Family Medicine

## 2022-01-25 VITALS — BP 131/76 | HR 93 | Temp 97.8°F | Ht 64.0 in | Wt 180.4 lb

## 2022-01-25 DIAGNOSIS — J0110 Acute frontal sinusitis, unspecified: Secondary | ICD-10-CM | POA: Diagnosis not present

## 2022-01-25 DIAGNOSIS — H6502 Acute serous otitis media, left ear: Secondary | ICD-10-CM | POA: Diagnosis not present

## 2022-01-25 MED ORDER — AZITHROMYCIN 250 MG PO TABS: ORAL_TABLET | ORAL | 0 refills | Status: DC

## 2022-01-25 NOTE — Telephone Encounter (Signed)
Pt is coming in for OV today

## 2022-01-25 NOTE — Progress Notes (Signed)
Assessment & Plan:  1. Non-recurrent acute serous otitis media of left ear - azithromycin (ZITHROMAX Z-PAK) 250 MG tablet; Take 2 tablets (500 mg) PO today, then 1 tablet (250 mg) PO daily x4 days.  Dispense: 6 tablet; Refill: 0  2. Acute non-recurrent frontal sinusitis Education provided on sinusitis. - azithromycin (ZITHROMAX Z-PAK) 250 MG tablet; Take 2 tablets (500 mg) PO today, then 1 tablet (250 mg) PO daily x4 days.  Dispense: 6 tablet; Refill: 0   Follow up plan: Return if symptoms worsen or fail to improve.  Hendricks Limes, MSN, APRN, [name]-C [facility]  Subjective:   Patient ID: Kelly [name], female    DOB: 06-11-1986, 36 y.o.   MRN: [id number]  HPI: Kelly [name] is a 36 y.o. female presenting on 01/25/2022 for ears feel muffled  (X1 week bilateral ), Nasal Congestion, and Cough (X 2 weeks)  Patient complains of cough, head congestion, and ear pain/pressure. Onset of symptoms was  1.5  days ago, unchanged since that time. She is drinking plenty of fluids. Evaluation to date: previously seen and treated as a URI with prednisone.     ROS: Negative unless specifically indicated above in HPI.   Relevant past medical history reviewed and updated as indicated.   Allergies and medications reviewed and updated.   Current Outpatient Medications:    acetaminophen (TYLENOL) 325 MG tablet, Take by mouth., Disp: , Rfl:    aspirin 81 MG EC tablet, Take by mouth., Disp: , Rfl:    sertraline (ZOLOFT) 50 MG tablet, Take 1 tablet (50 mg total) by mouth daily., Disp: 30 tablet, Rfl: 5   azelastine (ASTELIN) 0.1 % nasal spray, Place 1 spray into both nostrils 2 (two) times daily. (Patient not taking: Reported on 01/25/2022), Disp: 30 mL, Rfl: 12   cetirizine (ZYRTEC) 10 MG tablet, Take by mouth. (Patient not taking: Reported on 10/23/2021), Disp: , Rfl:    guaiFENesin (MUCINEX) 600 MG 12 hr tablet, Take 1 tablet (600 mg total) by mouth 2 (two)  times daily for 10 days. (Patient not taking: Reported on 01/25/2022), Disp: 20 tablet, Rfl: 0   Prenatal Vit-Fe Fumarate-FA (PRENATAL 1+1 PO), Prenatal (Patient not taking: Reported on 01/25/2022), Disp: , Rfl:    rosuvastatin (CRESTOR) 20 MG tablet, Take 1 tablet by mouth daily. (Patient not taking: Reported on 01/25/2022), Disp: , Rfl:   No Known Allergies  Objective:   BP 131/76    Pulse 93    Temp 97.8 F (36.6 C) (Temporal)    Ht 5\' 4"  (1.626 m)    Wt 180 lb 6.4 oz (81.8 kg)    SpO2 97%    BMI 30.97 kg/m    Physical Exam Vitals reviewed.  Constitutional:      General: She is not in acute distress.    Appearance: Normal appearance. She is not ill-appearing, toxic-appearing or diaphoretic.  HENT:     Head: Normocephalic and atraumatic.     Right Ear: Ear canal and external ear normal. There is no impacted cerumen. Tympanic membrane is bulging.     Left Ear: Ear canal and external ear normal. There is no impacted cerumen. Tympanic membrane is erythematous and bulging.     Nose: No congestion or rhinorrhea.     Right Sinus: Frontal sinus tenderness present.     Left Sinus: Frontal sinus tenderness present.     Mouth/Throat:     Mouth: Mucous membranes are moist.     Pharynx: Oropharynx is  clear. No oropharyngeal exudate or posterior oropharyngeal erythema.  Eyes:     General: No scleral icterus.       Right eye: No discharge.        Left eye: No discharge.     Conjunctiva/sclera: Conjunctivae normal.  Cardiovascular:     Rate and Rhythm: Normal rate and regular rhythm.     Heart sounds: Normal heart sounds. No murmur heard.   No friction rub. No gallop.  Pulmonary:     Effort: Pulmonary effort is normal. No respiratory distress.     Breath sounds: Normal breath sounds. No stridor. No wheezing, rhonchi or rales.  Musculoskeletal:        General: Normal range of motion.     Cervical back: Normal range of motion.  Lymphadenopathy:     Cervical: No cervical adenopathy.  Skin:     General: Skin is warm and dry.     Capillary Refill: Capillary refill takes less than 2 seconds.  Neurological:     General: No focal deficit present.     Mental Status: She is alert and oriented to person, place, and time. Mental status is at baseline.  Psychiatric:        Mood and Affect: Mood normal.        Behavior: Behavior normal.        Thought Content: Thought content normal.        Judgment: Judgment normal.

## 2022-02-11 ENCOUNTER — Encounter: Payer: Self-pay | Admitting: Family Medicine

## 2022-02-11 ENCOUNTER — Ambulatory Visit: Payer: BC Managed Care – PPO | Admitting: Family Medicine

## 2022-02-11 VITALS — BP 103/54 | HR 88 | Temp 97.0°F | Ht 64.0 in | Wt 186.2 lb

## 2022-02-11 DIAGNOSIS — J0100 Acute maxillary sinusitis, unspecified: Secondary | ICD-10-CM

## 2022-02-11 MED ORDER — MOXIFLOXACIN HCL 400 MG PO TABS: 400.0000 mg | ORAL_TABLET | Freq: Every day | ORAL | 0 refills | Status: DC

## 2022-02-11 MED ORDER — BETAMETHASONE SOD PHOS & ACET 6 (3-3) MG/ML IJ SUSP
6.0000 mg | Freq: Once | INTRAMUSCULAR | Status: AC
  Administered 2022-02-11: 6 mg via INTRAMUSCULAR

## 2022-02-11 NOTE — Progress Notes (Signed)
Chief Complaint  ?Patient presents with  ? Sinusitis  ? Nasal Congestion  ? ? ?HPI ? ?Patient presents today for Symptoms include congestion, facial pain, nasal congestion,  productive cough, chest congestion, post nasal drip and sinus pressure. There is no fever, chills, or sweats. Onset of symptoms was 2 months ago, gradually worsening since that time. Has had Zpack & amoxicillin. Temporary relief only.Also having seasonal allergy sx.  ? ? ? ?PMH: Smoking status noted ?ROS: Per HPI ? ?Objective: ?BP (!) 103/54   Pulse 88   Temp (!) 97 ?F (36.1 ?C)   Ht 5\' 4"  (1.626 m)   Wt 186 lb 3.2 oz (84.5 kg)   SpO2 98%   BMI 31.96 kg/m?  ?Gen: NAD, alert, cooperative with exam ?HEENT: NCAT, EOMI, PERRL. Nasal passages swollen, face tender to percussion ?CV: RRR, good S1/S2, no murmur ?Resp: CTABL, no wheezes, non-labored ?Ext: No edema, warm ?Neuro: Alert and oriented, No gross deficits ? ?Assessment and plan: ? ?1. Acute maxillary sinusitis, recurrence not specified   ? ? ?Meds ordered this encounter  ?Medications  ? moxifloxacin (AVELOX) 400 MG tablet  ?  Sig: Take 1 tablet (400 mg total) by mouth daily. Take all of these, for infection  ?  Dispense:  10 tablet  ?  Refill:  0  ? betamethasone acetate-betamethasone sodium phosphate (CELESTONE) injection 6 mg  ? ? ?No orders of the defined types were placed in this encounter. ? ? ?Follow up as needed. ? ? , MD ? ? ? ? ?

## 2022-04-23 ENCOUNTER — Encounter: Payer: Self-pay | Admitting: Family Medicine

## 2022-04-23 ENCOUNTER — Ambulatory Visit: Payer: BC Managed Care – PPO | Admitting: Family Medicine

## 2022-04-23 DIAGNOSIS — J988 Other specified respiratory disorders: Secondary | ICD-10-CM | POA: Diagnosis not present

## 2022-04-23 DIAGNOSIS — B9689 Other specified bacterial agents as the cause of diseases classified elsewhere: Secondary | ICD-10-CM

## 2022-04-23 MED ORDER — AMOXICILLIN-POT CLAVULANATE 875-125 MG PO TABS
1.0000 | ORAL_TABLET | Freq: Two times a day (BID) | ORAL | 0 refills | Status: AC
Start: 2022-04-23 — End: 2022-04-30

## 2022-04-23 NOTE — Progress Notes (Signed)
   Virtual Visit via Telephone Note  I connected with Kelly [name] on 04/23/22 at 11:00 AM by telephone and verified that I am speaking with the correct person using two identifiers. Kelly [name] is currently located at the park and her mom is currently with her during this visit. The provider, Gwenlyn Fudge, [name] is located in their office at time of visit.  I discussed the limitations, risks, security and privacy concerns of performing an evaluation and management service by telephone and the availability of in person appointments. I also discussed with the patient that there may be a patient responsible charge related to this service. The patient expressed understanding and agreed to proceed.  Subjective: PCP: Raliegh Ip, DO  Chief Complaint  Patient presents with   URI   Patient complains of cough, head/chest congestion, and postnasal drainage. Onset of symptoms was 1 week ago, gradually worsening since that time. She is drinking plenty of fluids. Evaluation to date: none. Treatment to date: antihistamines.  She does not smoke.    ROS: Per HPI  Current Outpatient Medications:    acetaminophen (TYLENOL) 325 MG tablet, Take by mouth., Disp: , Rfl:    aspirin 81 MG EC tablet, Take by mouth., Disp: , Rfl:    cetirizine (ZYRTEC) 10 MG tablet, Take by mouth., Disp: , Rfl:    Cholecalciferol 25 MCG (1000 UT) tablet, Take by mouth., Disp: , Rfl:    moxifloxacin (AVELOX) 400 MG tablet, Take 1 tablet (400 mg total) by mouth daily. Take all of these, for infection, Disp: 10 tablet, Rfl: 0   Prenatal Vit-Fe Fumarate-FA (PRENATAL 1+1 PO), , Disp: , Rfl:    rosuvastatin (CRESTOR) 20 MG tablet, Take 1 tablet by mouth daily., Disp: , Rfl:    sertraline (ZOLOFT) 50 MG tablet, Take 1 tablet (50 mg total) by mouth daily., Disp: 30 tablet, Rfl: 5  No Known Allergies Past Medical History:  Diagnosis Date   Anxiety    Headache    Missed abortion 05/2015   no surgery  required   PONV (postoperative nausea and vomiting)    Stroke (HCC)     Observations/Objective: A&O  No respiratory distress or wheezing audible over the phone Mood, judgement, and thought processes all WNL  Assessment and Plan: 1. Bacterial respiratory infection Continue symptom management. - amoxicillin-clavulanate (AUGMENTIN) 875-125 MG tablet; Take 1 tablet by mouth 2 (two) times daily for 7 days.  Dispense: 14 tablet; Refill: 0   Follow Up Instructions:  I discussed the assessment and treatment plan with the patient. The patient was provided an opportunity to ask questions and all were answered. The patient agreed with the plan and demonstrated an understanding of the instructions.   The patient was advised to call back or seek an in-person evaluation if the symptoms worsen or if the condition fails to improve as anticipated.  The above assessment and management plan was discussed with the patient. The patient verbalized understanding of and has agreed to the management plan. Patient is aware to call the clinic if symptoms persist or worsen. Patient is aware when to return to the clinic for a follow-up visit. Patient educated on when it is appropriate to go to the emergency department.   Time call ended: 11:11 AM  I provided 11 minutes of non-face-to-face time during this encounter.  [name], MSN, APRN, [name]-C [facility] 04/23/22

## 2022-05-07 ENCOUNTER — Encounter: Payer: Self-pay | Admitting: Family

## 2022-05-07 ENCOUNTER — Ambulatory Visit: Payer: BC Managed Care – PPO | Admitting: Family

## 2022-05-07 VITALS — BP 111/63 | HR 90 | Temp 98.3°F | Wt 185.0 lb

## 2022-05-07 DIAGNOSIS — J029 Acute pharyngitis, unspecified: Secondary | ICD-10-CM | POA: Diagnosis not present

## 2022-05-07 DIAGNOSIS — R0989 Other specified symptoms and signs involving the circulatory and respiratory systems: Secondary | ICD-10-CM | POA: Diagnosis not present

## 2022-05-07 LAB — RAPID STREP SCREEN (MED CTR MEBANE ONLY): Strep Gp A Ag, IA W/Reflex: NEGATIVE

## 2022-05-07 LAB — CULTURE, GROUP A STREP

## 2022-05-07 NOTE — Patient Instructions (Signed)
Sore Throat A sore throat is pain, burning, irritation, or scratchiness in the throat. When you have a sore throat, you may feel pain or tenderness in your throat when you swallow or talk. Many things can cause a sore throat, including: An infection. Seasonal allergies. Dryness in the air. Irritants, such as smoke or pollution. Radiation treatment for cancer. Gastroesophageal reflux disease (GERD). A tumor. A sore throat is often the first sign of another sickness. It may happen with other symptoms, such as coughing, sneezing, fever, and swollen neck glands. Most sore throats go away without medical treatment. Follow these instructions at home:     Medicines Take over-the-counter and prescription medicines only as told by your health care provider. Children often get sore throats. Do not give your child aspirin because of the association with Reye's syndrome. Use throat sprays to soothe your throat as told by your health care provider. Managing pain To help with pain, try: Sipping warm liquids, such as broth, herbal tea, or warm water. Eating or drinking cold or frozen liquids, such as frozen ice pops. Gargling with a mixture of salt and water 3-4 times a day or as needed. To make salt water, completely dissolve -1 tsp (3-6 g) of salt in 1 cup (237 mL) of warm water. Sucking on hard candy or throat lozenges. Putting a cool-mist humidifier in your bedroom at night to moisten the air. Sitting in the bathroom with the door closed for 5-10 minutes while you run hot water in the shower. General instructions Do not use any products that contain nicotine or tobacco. These products include cigarettes, chewing tobacco, and vaping devices, such as e-cigarettes. If you need help quitting, ask your health care provider. Rest as needed. Drink enough fluid to keep your urine pale yellow. Wash your hands often with soap and water for at least 20 seconds. If soap and water are not available, use hand  sanitizer. Contact a health care provider if: You have a fever for more than 2-3 days. You have symptoms that last for more than 2-3 days. Your throat does not get better within 7 days. You have a fever and your symptoms suddenly get worse. Get help right away if: You have difficulty breathing. You cannot swallow fluids, soft foods, or your saliva. You have increased swelling in your throat or neck. You have persistent nausea and vomiting. These symptoms may represent a serious problem that is an emergency. Do not wait to see if the symptoms will go away. Get medical help right away. Call your local emergency services (911 in the [place] Do not drive yourself to the hospital. Summary A sore throat is pain, burning, irritation, or scratchiness in the throat. Many things can cause a sore throat. Take over-the-counter medicines only as told by your health care provider. Rest as needed. Drink enough fluid to keep your urine pale yellow. Contact a health care provider if your throat does not get better within 7 days. This information is not intended to replace advice given to you by your health care provider. Make sure you discuss any questions you have with your health care provider. Document Revised: 02/14/2021 Document Reviewed: 02/14/2021 Elsevier Patient Education  2023 Elsevier Inc.  

## 2022-05-07 NOTE — Progress Notes (Signed)
Subjective:    Patient ID: Kelly [name], female    DOB: 1985/12/24, 36 y.o.   MRN: [id number]  Chief Complaint  Patient presents with   Sore Throat    Thinks she has a popcorn kernel stuck in throat   Pt presents to the office today with a feeling of something stuck in her throat. Reports she was eating popcorn on Saturday and then woke up the next morning with this feeling. Denies any fever. Mild pain with swallowing.  Sore Throat  This is a new problem. The current episode started in the past 7 days. The problem has been waxing and waning. There has been no fever. The pain is mild. Associated symptoms include ear pain and trouble swallowing. Pertinent negatives include no headaches, shortness of breath or swollen glands. She has had no exposure to strep. She has tried acetaminophen for the symptoms. The treatment provided mild relief.     Review of Systems  HENT:  Positive for ear pain and trouble swallowing.   Respiratory:  Negative for shortness of breath.   Neurological:  Negative for headaches.  All other systems reviewed and are negative.     Objective:   Physical Exam Vitals reviewed.  Constitutional:      General: She is not in acute distress.    Appearance: She is well-developed.  HENT:     Head: Normocephalic and atraumatic.     Right Ear: External ear normal.     Mouth/Throat:     Pharynx: Posterior oropharyngeal erythema present.  Eyes:     Pupils: Pupils are equal, round, and reactive to light.  Neck:     Thyroid: No thyromegaly.  Cardiovascular:     Rate and Rhythm: Normal rate and regular rhythm.     Heart sounds: Normal heart sounds. No murmur heard. Pulmonary:     Effort: Pulmonary effort is normal. No respiratory distress.     Breath sounds: Normal breath sounds. No wheezing.  Abdominal:     General: Bowel sounds are normal. There is no distension.     Palpations: Abdomen is soft.     Tenderness: There is no abdominal tenderness.   Musculoskeletal:        General: No tenderness. Normal range of motion.     Cervical back: Normal range of motion and neck supple.  Skin:    General: Skin is warm and dry.  Neurological:     Mental Status: She is alert and oriented to person, place, and time.     Cranial Nerves: No cranial nerve deficit.     Deep Tendon Reflexes: Reflexes are normal and symmetric.  Psychiatric:        Behavior: Behavior normal.        Thought Content: Thought content normal.        Judgment: Judgment normal.      BP 111/63   Pulse 90   Temp 98.3 F (36.8 C)   Wt 185 lb (83.9 kg)   SpO2 96%   BMI 31.76 kg/m      Assessment & Plan:  Kelly [name] comes in today with chief complaint of Sore Throat (Thinks she has a popcorn kernel stuck in throat)   Diagnosis and orders addressed:  1. Sore throat - Rapid Strep Screen ([facility] ONLY) - Ambulatory referral to ENT  2. Foreign body sensation in throat - Ambulatory referral to ENT  Will do referral to ENT Strep is negative  Recommend drinking hot tea Flonase  and zyrtec    [name], [name]

## 2022-05-16 ENCOUNTER — Telehealth: Payer: Self-pay | Admitting: Family Medicine

## 2022-05-16 NOTE — Telephone Encounter (Signed)
Patient states she has been having vaginal itching with no discharge since yesterday.

## 2022-05-16 NOTE — Telephone Encounter (Signed)
Patient aware and states she just fished the abx 2 days ago

## 2022-05-16 NOTE — Telephone Encounter (Signed)
I need to know what type of symptoms she is having.

## 2022-05-16 NOTE — Telephone Encounter (Signed)
This is not related to antibiotic use last month - please schedule an appointment.

## 2022-08-07 ENCOUNTER — Encounter: Payer: Self-pay | Admitting: Family Medicine

## 2022-08-07 ENCOUNTER — Ambulatory Visit: Payer: BC Managed Care – PPO | Admitting: Family Medicine

## 2022-08-07 VITALS — BP 103/57 | HR 80 | Temp 97.9°F | Resp 20 | Ht 64.0 in | Wt 185.0 lb

## 2022-08-07 DIAGNOSIS — M79604 Pain in right leg: Secondary | ICD-10-CM

## 2022-08-07 MED ORDER — PREDNISONE 10 MG (21) PO TBPK: ORAL_TABLET | ORAL | 0 refills | Status: DC

## 2022-08-07 MED ORDER — METHOCARBAMOL 500 MG PO TABS: 500.0000 mg | ORAL_TABLET | Freq: Three times a day (TID) | ORAL | 0 refills | Status: DC | PRN

## 2022-08-07 NOTE — Progress Notes (Signed)
Assessment & Plan:  1. Right leg pain Reassurance provided this does not present like a DVT.  Offered an ultrasound for further reassurance but she declines at this time.  Started on prednisone and Robaxin. - predniSONE (STERAPRED UNI-PAK 21 TAB) 10 MG (21) TBPK tablet; As directed x 6 days  Dispense: 21 tablet; Refill: 0 - methocarbamol (ROBAXIN) 500 MG tablet; Take 1 tablet (500 mg total) by mouth every 8 (eight) hours as needed for muscle spasms.  Dispense: 60 tablet; Refill: 0   Follow up plan: Return if symptoms worsen or fail to improve.  Kelly Boston, MSN, APRN, [name]-C [facility]  Subjective:   Patient ID: Kelly [name], female    DOB: 04/04/1986, 36 y.o.   MRN: [id number]  HPI: Kelly [name] is a 36 y.o. female presenting on 08/07/2022 for right calf pain  (X 1 mo )  Patient reports an area of discomfort on her right calf that has been present x1 month. It feels like a pulled muscle.  It is worse first thing in the morning and with walking. Denies swelling, redness, and warmth. No home treatments. She feels this started when her plantar fasciitis was acting up a month ago; this has resolved with shoe inserts. She is worried about a DVT.   ROS: Negative unless specifically indicated above in HPI.   Relevant past medical history reviewed and updated as indicated.   Allergies and medications reviewed and updated.   Current Outpatient Medications:    aspirin 81 MG EC tablet, Take by mouth., Disp: , Rfl:    cetirizine (ZYRTEC) 10 MG tablet, Take by mouth., Disp: , Rfl:    rosuvastatin (CRESTOR) 20 MG tablet, Take 1 tablet by mouth daily., Disp: , Rfl:    sertraline (ZOLOFT) 50 MG tablet, Take 1 tablet (50 mg total) by mouth daily., Disp: 30 tablet, Rfl: 5  No Known Allergies  Objective:   BP (!) 103/57   Pulse 80   Temp 97.9 F (36.6 C)   Resp 20   Ht 5\' 4"  (1.626 m)   Wt 185 lb (83.9 kg)   LMP 08/04/2022 (Exact Date)    SpO2 97%   Breastfeeding No   BMI 31.76 kg/m    Physical Exam Vitals reviewed.  Constitutional:      General: She is not in acute distress.    Appearance: Normal appearance. She is not ill-appearing, toxic-appearing or diaphoretic.  HENT:     Head: Normocephalic and atraumatic.  Eyes:     General: No scleral icterus.       Right eye: No discharge.        Left eye: No discharge.     Conjunctiva/sclera: Conjunctivae normal.  Cardiovascular:     Rate and Rhythm: Normal rate.  Pulmonary:     Effort: Pulmonary effort is normal. No respiratory distress.  Musculoskeletal:        General: Normal range of motion.     Cervical back: Normal range of motion.     Right lower leg: Tenderness (back of upper calf) present. No swelling, deformity, lacerations or bony tenderness. No edema.  Skin:    General: Skin is warm and dry.     Capillary Refill: Capillary refill takes less than 2 seconds.  Neurological:     General: No focal deficit present.     Mental Status: She is alert and oriented to person, place, and time. Mental status is at baseline.  Psychiatric:  Mood and Affect: Mood normal.        Behavior: Behavior normal.        Thought Content: Thought content normal.        Judgment: Judgment normal.

## 2022-08-16 ENCOUNTER — Telehealth (INDEPENDENT_AMBULATORY_CARE_PROVIDER_SITE_OTHER): Payer: BC Managed Care – PPO | Admitting: Family

## 2022-08-16 ENCOUNTER — Encounter: Payer: Self-pay | Admitting: Family

## 2022-08-16 DIAGNOSIS — J0100 Acute maxillary sinusitis, unspecified: Secondary | ICD-10-CM | POA: Diagnosis not present

## 2022-08-16 MED ORDER — AMOXICILLIN-POT CLAVULANATE 875-125 MG PO TABS: 1.0000 | ORAL_TABLET | Freq: Two times a day (BID) | ORAL | 0 refills | Status: DC

## 2022-08-16 NOTE — Progress Notes (Signed)
Virtual Visit Consent   Kelly [name], you are scheduled for a virtual visit with a [facility] provider today. Just as with appointments in the office, your consent must be obtained to participate. Your consent will be active for this visit and any virtual visit you may have with one of our providers in the next 365 days. If you have a MyChart account, a copy of this consent can be sent to you electronically.  As this is a virtual visit, video technology does not allow for your provider to perform a traditional examination. This may limit your provider's ability to fully assess your condition. If your provider identifies any concerns that need to be evaluated in person or the need to arrange testing (such as labs, EKG, etc.), we will make arrangements to do so. Although advances in technology are sophisticated, we cannot ensure that it will always work on either your end or our end. If the connection with a video visit is poor, the visit may have to be switched to a telephone visit. With either a video or telephone visit, we are not always able to ensure that we have a secure connection.  By engaging in this virtual visit, you consent to the provision of healthcare and authorize for your insurance to be billed (if applicable) for the services provided during this visit. Depending on your insurance coverage, you may receive a charge related to this service.  I need to obtain your verbal consent now. Are you willing to proceed with your visit today? Kelly [name] has provided verbal consent on 08/16/2022 for a virtual visit (video or telephone). [name], [name]  Date: 08/16/2022 12:13 PM  Virtual Visit via Video Note   I, [name], connected with  Kelly [name]  ([id number], June 29, 1986) on 08/16/22 at 10:40 AM EDT by a video-enabled telemedicine application and verified that I am speaking with the correct person using two identifiers.  Location: Patient: Virtual  Visit Location Patient: Other: outside Provider: Virtual Visit Location Provider: Office/Clinic   I discussed the limitations of evaluation and management by telemedicine and the availability of in person appointments. The patient expressed understanding and agreed to proceed.    History of Present Illness: Kelly [name] is a 36 y.o. who identifies as a female who was assigned female at birth, and is being seen today for sinus problems for the last week.  HPI: Sinusitis This is a new problem. The current episode started 1 to 4 weeks ago. The problem has been waxing and waning since onset. There has been no fever. Her pain is at a severity of 2/10. The pain is mild. Associated symptoms include congestion, coughing, headaches, a hoarse voice, sinus pressure and sneezing. Pertinent negatives include no chills, ear pain, sore throat or swollen glands. Past treatments include acetaminophen. The treatment provided mild relief.    Problems:  Patient Active Problem List   Diagnosis Date Noted   History of CVA (cerebrovascular accident) 09/06/2021   GAD (generalized anxiety disorder) 09/06/2021   PFO (patent foramen ovale) 09/06/2021   S/P tubal ligation 05/21/2021   History of gestational diabetes 12/26/2020   Previous cesarean section 01/11/2019    Allergies: No Known Allergies Medications:  Current Outpatient Medications:    amoxicillin-clavulanate (AUGMENTIN) 875-125 MG tablet, Take 1 tablet by mouth 2 (two) times daily., Disp: 14 tablet, Rfl: 0   aspirin 81 MG EC tablet, Take by mouth., Disp: , Rfl:    cetirizine (ZYRTEC) 10 MG tablet, Take by  mouth., Disp: , Rfl:    rosuvastatin (CRESTOR) 20 MG tablet, Take 1 tablet by mouth daily., Disp: , Rfl:    sertraline (ZOLOFT) 50 MG tablet, Take 1 tablet (50 mg total) by mouth daily., Disp: 30 tablet, Rfl: 5  Observations/Objective: Patient is well-developed, well-nourished in no acute distress.  Resting comfortably  Head is  normocephalic, atraumatic.  No labored breathing.  Speech is clear and coherent with logical content.  Patient is alert and oriented at baseline.  Nasal congestion  Assessment and Plan: 1. Acute non-recurrent maxillary sinusitis - amoxicillin-clavulanate (AUGMENTIN) 875-125 MG tablet; Take 1 tablet by mouth 2 (two) times daily.  Dispense: 14 tablet; Refill: 0  - Take meds as prescribed - Use a cool mist humidifier  -Use saline nose sprays frequently -Force fluids -For any cough or congestion  Use plain Mucinex- regular strength or max strength is fine -For fever or aces or pains- take tylenol or ibuprofen. -Throat lozenges if help -Follow up if symptoms worsen or do not improve   Follow Up Instructions: I discussed the assessment and treatment plan with the patient. The patient was provided an opportunity to ask questions and all were answered. The patient agreed with the plan and demonstrated an understanding of the instructions.  A copy of instructions were sent to the patient via MyChart unless otherwise noted below.    The patient was advised to call back or seek an in-person evaluation if the symptoms worsen or if the condition fails to improve as anticipated.  Time:  I spent 10 minutes with the patient via telehealth technology discussing the above problems/concerns.    [name], [name]

## 2022-10-09 ENCOUNTER — Ambulatory Visit: Payer: BC Managed Care – PPO | Admitting: Family Medicine

## 2022-10-09 ENCOUNTER — Encounter: Payer: Self-pay | Admitting: Family Medicine

## 2022-10-09 VITALS — BP 104/66 | HR 83 | Temp 97.9°F | Ht 64.0 in | Wt 191.2 lb

## 2022-10-09 DIAGNOSIS — M76891 Other specified enthesopathies of right lower limb, excluding foot: Secondary | ICD-10-CM

## 2022-10-09 MED ORDER — NABUMETONE 500 MG PO TABS: 1000.0000 mg | ORAL_TABLET | Freq: Two times a day (BID) | ORAL | 1 refills | Status: DC

## 2022-10-09 NOTE — Progress Notes (Signed)
Chief Complaint  Patient presents with   Leg Strain    Right lower    HPI  Patient presents today for pain behind the right knee. Onset 2 -3 months ago. Feels ago. NKI. Gets tight when she first stands. Annoying. Concerned it is caused by her flat feet.   PMH: Smoking status noted ROS: Per HPI  Objective: BP 104/66   Pulse 83   Temp 97.9 F (36.6 C)   Ht 5\' 4"  (1.626 m)   Wt 191 lb 3.2 oz (86.7 kg)   SpO2 98%   BMI 32.82 kg/m  Gen: NAD, alert, cooperative with exam HEENT: NCAT, EOMI, PERRL CV: RRR, good S1/S2, no murmur Resp: CTABL, no wheezes, non-labored Abd: SNTND, BS present, no guarding or organomegaly Ext: No edema, warm Neuro: Alert and oriented, No gross deficits  Assessment and plan:  1. Hamstring tendinitis of right thigh     Meds ordered this encounter  Medications   nabumetone (RELAFEN) 500 MG tablet    Sig: Take 2 tablets (1,000 mg total) by mouth 2 (two) times daily. For muscle and joint pain    Dispense:  360 tablet    Refill:  1    Orders Placed This Encounter  Procedures   Ambulatory referral to Physical Therapy    Referral Priority:   Routine    Referral Type:   Physical Medicine    Referral Reason:   Specialty Services Required    Requested Specialty:   Physical Therapy    Number of Visits Requested:   1    Follow up as needed.  , MD

## 2022-12-03 ENCOUNTER — Telehealth (INDEPENDENT_AMBULATORY_CARE_PROVIDER_SITE_OTHER): Payer: BC Managed Care – PPO | Admitting: Family Medicine

## 2022-12-03 ENCOUNTER — Encounter: Payer: Self-pay | Admitting: Family Medicine

## 2022-12-03 DIAGNOSIS — J020 Streptococcal pharyngitis: Secondary | ICD-10-CM

## 2022-12-03 MED ORDER — AMOXICILLIN-POT CLAVULANATE 875-125 MG PO TABS: 1.0000 | ORAL_TABLET | Freq: Two times a day (BID) | ORAL | 0 refills | Status: DC

## 2022-12-03 NOTE — Progress Notes (Signed)
Subjective:    Patient ID: Kelly [name], female    DOB: [birth year], 36 y.o.   MRN: [id number]   HPI: Kelly [name] is a 37 y.o. female presenting for strep throat sx and HA. Some cough in Ams. Onset 2 days ago. No fever. Husband & two kids at home have been dxed with strep last week.       10/09/2022    4:08 PM 08/07/2022    3:16 PM 05/07/2022    3:14 PM 02/11/2022    3:02 PM 01/25/2022    4:28 PM  Depression screen PHQ 2/9  Decreased Interest 0 0 0 0 0  Down, Depressed, Hopeless 0 0 0 0 0  PHQ - 2 Score 0 0 0 0 0  Altered sleeping     0  Tired, decreased energy     0  Change in appetite     1  Feeling bad or failure about yourself      0  Trouble concentrating     0  Moving slowly or fidgety/restless     0  Suicidal thoughts     0  PHQ-9 Score     1  Difficult doing work/chores     Not difficult at [name]     Relevant past medical, surgical, family and social history reviewed and updated as indicated.  Interim medical history since our last visit reviewed. Allergies and medications reviewed and updated.  ROS:  Review of Systems  Constitutional:  Negative for activity change, appetite change, chills and fever.  HENT:  Positive for congestion, rhinorrhea (clear) and sinus pressure. Negative for ear discharge, ear pain, hearing loss, nosebleeds, postnasal drip, sneezing and trouble swallowing.   Respiratory:  Negative for chest tightness and shortness of breath.   Cardiovascular:  Negative for chest pain and palpitations.  Skin:  Negative for rash.     Social History   Tobacco Use  Smoking Status Never  Smokeless Tobacco Never       Objective:    Video performed. No distress. Skin tone nml. A&O X3. No facial rash. Could not visualize pharynx.  Wt Readings from Last 3 Encounters:  10/09/22 191 lb 3.2 oz (86.7 kg)  08/07/22 185 lb (83.9 kg)  05/07/22 185 lb (83.9 kg)     Video visit performed.   Assessment & Plan:   1. Strep pharyngitis      Meds ordered this encounter  Medications   amoxicillin-clavulanate (AUGMENTIN) 875-125 MG tablet    Sig: Take 1 tablet by mouth 2 (two) times daily. Take [name] of this medication    Dispense:  20 tablet    Refill:  0    No orders of the defined types were placed in this encounter.     Diagnoses and [name] orders for this visit:  Strep pharyngitis  Other orders -     amoxicillin-clavulanate (AUGMENTIN) 875-125 MG tablet; Take 1 tablet by mouth 2 (two) times daily. Take [name] of this medication    Virtual Visit via telephone Note  I discussed the limitations, risks, security and privacy concerns of performing an evaluation and management service by telephone and the availability of in person appointments. The patient was identified with two identifiers. Pt.expressed understanding and agreed to proceed. Pt. Is at home. Dr. Livia Snellen is in his office.  Follow Up Instructions:   I discussed the assessment and treatment plan with the patient. The patient was provided an opportunity to ask questions and [name] were answered. The  patient agreed with the plan and demonstrated an understanding of the instructions.   The patient was advised to call back or seek an in-person evaluation if the symptoms worsen or if the condition fails to improve as anticipated.   Total minutes contact time: 11   Follow up plan: Return if symptoms worsen or fail to improve.  Kelly Fraise, MD [place]

## 2022-12-04 ENCOUNTER — Other Ambulatory Visit: Payer: Self-pay | Admitting: *Deleted

## 2022-12-04 ENCOUNTER — Ambulatory Visit (INDEPENDENT_AMBULATORY_CARE_PROVIDER_SITE_OTHER): Payer: BC Managed Care – PPO | Admitting: Family Medicine

## 2022-12-04 ENCOUNTER — Telehealth: Payer: Self-pay | Admitting: Family Medicine

## 2022-12-04 ENCOUNTER — Encounter: Payer: Self-pay | Admitting: Family Medicine

## 2022-12-04 DIAGNOSIS — Z20828 Contact with and (suspected) exposure to other viral communicable diseases: Secondary | ICD-10-CM | POA: Diagnosis not present

## 2022-12-04 DIAGNOSIS — J111 Influenza due to unidentified influenza virus with other respiratory manifestations: Secondary | ICD-10-CM | POA: Diagnosis not present

## 2022-12-04 DIAGNOSIS — R6889 Other general symptoms and signs: Secondary | ICD-10-CM

## 2022-12-04 MED ORDER — BENZONATATE 100 MG PO CAPS: 100.0000 mg | ORAL_CAPSULE | Freq: Three times a day (TID) | ORAL | 0 refills | Status: DC | PRN

## 2022-12-04 MED ORDER — OSELTAMIVIR PHOSPHATE 75 MG PO CAPS: 75.0000 mg | ORAL_CAPSULE | Freq: Two times a day (BID) | ORAL | 0 refills | Status: AC

## 2022-12-04 NOTE — Progress Notes (Signed)
Telephone visit  Subjective: CC:FLU like symptoms PCP: Janora Norlander, DO [id number] Kelly [name] is a 37 y.o. female calls for telephone consult today. Patient provides verbal consent for consult held via phone.  Due to COVID-19 pandemic this visit was conducted virtually. This visit type was conducted due to national recommendations for restrictions regarding the COVID-19 Pandemic (e.g. social distancing, sheltering in place) in an effort to limit this patient's exposure and mitigate transmission in our community. All issues noted in this document were discussed and addressed.  A physical exam was not performed with this format.   Location of patient: home Location of provider: [facility] Others present for call: children  1. Flu Exposure to influenza by her child who was confirmed.  She is also being treated for strep throat, which her children also have.  She reports mild, nonproductive cough, headache, rhinorrhea, watery eyes, diarrhea and bodyaches.  No reports of nausea, vomiting, shortness of breath or wheezing.  Utilizing Mucinex OTC flu and cold with minimal improvement.  Last menstrual cycle was 12/01/2022.   ROS: Per HPI  No Known Allergies Past Medical History:  Diagnosis Date   Anxiety    Headache    Missed abortion 05/2015   no surgery required   PONV (postoperative nausea and vomiting)    Stroke [facility])     Current Outpatient Medications:    amoxicillin-clavulanate (AUGMENTIN) 875-125 MG tablet, Take 1 tablet by mouth 2 (two) times daily. Take all of this medication, Disp: 20 tablet, Rfl: 0   aspirin 81 MG EC tablet, Take by mouth., Disp: , Rfl:    cetirizine (ZYRTEC) 10 MG tablet, Take by mouth., Disp: , Rfl:    nabumetone (RELAFEN) 500 MG tablet, Take 2 tablets (1,000 mg total) by mouth 2 (two) times daily. For muscle and joint pain, Disp: 360 tablet, Rfl: 1   rosuvastatin (CRESTOR) 20 MG tablet, Take 1 tablet by mouth daily., Disp: , Rfl:    sertraline (ZOLOFT)  50 MG tablet, Take 1 tablet (50 mg total) by mouth daily., Disp: 30 tablet, Rfl: 5  Assessment/ Plan: 37 y.o. female   Flu-like symptoms - Plan: oseltamivir (TAMIFLU) 75 MG capsule, benzonatate (TESSALON PERLES) 100 MG capsule  Exposure to the flu - Plan: oseltamivir (TAMIFLU) 75 MG capsule, benzonatate (TESSALON PERLES) 100 MG capsule  Very suspicious that she has FLU her known exposure to her child.  Empiric treatment with Tamiflu twice daily sent.  Tessalon Perles as needed.  Home care instructions reviewed.  Reasons for reevaluation discussed.  Okay to continue Augmentin and other prescribed medications for strep throat.  Follow-up as needed  Start time: 5:11pm End time: 5:16p  Total time spent on patient care (including telephone call/ virtual visit): 5 minutes  [place], [place] [phone]

## 2022-12-04 NOTE — Telephone Encounter (Signed)
Okay to do rapid flu test

## 2022-12-04 NOTE — Telephone Encounter (Signed)
DR Lajuana Ripple ADDED ON FOR PHONE VISIT THIS EVENING

## 2022-12-17 ENCOUNTER — Ambulatory Visit: Payer: BC Managed Care – PPO | Admitting: Family

## 2022-12-17 ENCOUNTER — Encounter: Payer: Self-pay | Admitting: Family

## 2022-12-17 VITALS — BP 103/66 | HR 84 | Temp 97.2°F | Ht 64.0 in | Wt 189.4 lb

## 2022-12-17 DIAGNOSIS — H65192 Other acute nonsuppurative otitis media, left ear: Secondary | ICD-10-CM

## 2022-12-17 MED ORDER — MOMETASONE FUROATE 50 MCG/ACT NA SUSP: 2.0000 | Freq: Every day | NASAL | 12 refills | Status: DC

## 2022-12-17 MED ORDER — CETIRIZINE HCL 10 MG PO TABS: 10.0000 mg | ORAL_TABLET | Freq: Every day | ORAL | 1 refills | Status: AC

## 2022-12-17 MED ORDER — AMOXICILLIN-POT CLAVULANATE 875-125 MG PO TABS: 1.0000 | ORAL_TABLET | Freq: Two times a day (BID) | ORAL | 0 refills | Status: DC

## 2022-12-17 NOTE — Progress Notes (Signed)
Subjective:    Patient ID: Kelly [name], female    DOB: 06-30-1986, 37 y.o.   MRN: [id number]  Chief Complaint  Patient presents with   Ear Pain    Feels weird left ear    PT presents to the office today with left ear fullness this weekend.  Otalgia  There is pain in the left ear. This is a new problem. The current episode started in the past 7 days. Episode frequency: coming and going. The problem has been gradually worsening. There has been no fever. The pain is at a severity of 1/10. The pain is mild. Associated symptoms include headaches and hearing loss. Pertinent negatives include no coughing, ear discharge, rhinorrhea or sore throat. She has tried NSAIDs and acetaminophen for the symptoms. The treatment provided mild relief.      Review of Systems  HENT:  Positive for ear pain and hearing loss. Negative for ear discharge, rhinorrhea and sore throat.   Respiratory:  Negative for cough.   Neurological:  Positive for headaches.  [name] other systems reviewed and are negative.      Objective:   Physical Exam Vitals reviewed.  Constitutional:      General: She is not in acute distress.    Appearance: She is well-developed.  HENT:     Head: Normocephalic and atraumatic.     Left Ear: Tenderness present. Tympanic membrane is erythematous.  Eyes:     Pupils: Pupils are equal, round, and reactive to light.  Neck:     Thyroid: No thyromegaly.  Cardiovascular:     Rate and Rhythm: Normal rate and regular rhythm.     Heart sounds: Normal heart sounds. No murmur heard. Pulmonary:     Effort: Pulmonary effort is normal. No respiratory distress.     Breath sounds: Normal breath sounds. No wheezing.  Abdominal:     General: Bowel sounds are normal. There is no distension.     Palpations: Abdomen is soft.     Tenderness: There is no abdominal tenderness.  Musculoskeletal:        General: No tenderness. Normal range of motion.     Cervical back: Normal range of motion  and neck supple.  Skin:    General: Skin is warm and dry.  Neurological:     Mental Status: She is alert and oriented to person, place, and time.     Cranial Nerves: No cranial nerve deficit.     Deep Tendon Reflexes: Reflexes are normal and symmetric.  Psychiatric:        Behavior: Behavior normal.        Thought Content: Thought content normal.        Judgment: Judgment normal.          BP 103/66   Pulse 84   Temp (!) 97.2 F (36.2 C) (Temporal)   Ht 5\' 4"  (1.626 m)   Wt 189 lb 6.4 oz (85.9 kg)   LMP 12/01/2022   SpO2 97%   BMI 32.51 kg/m   Assessment & Plan:  Kelly [name] comes in today with chief complaint of Ear Pain (Feels weird left ear )   Diagnosis and orders addressed:  1. Other non-recurrent acute nonsuppurative otitis media of left ear Start zyrtec and flonase Start nasal decongestant  Start Augmentin  Force fluids Tylenol as needed Follow up if symptoms worsen or do not improve  - amoxicillin-clavulanate (AUGMENTIN) 875-125 MG tablet; Take 1 tablet by mouth 2 (two) times daily.  Dispense:  14 tablet; Refill: 0 - cetirizine (ZYRTEC ALLERGY) 10 MG tablet; Take 1 tablet (10 mg total) by mouth daily.  Dispense: 90 tablet; Refill: 1 - mometasone (NASONEX) 50 MCG/ACT nasal spray; Place 2 sprays into the nose daily.  Dispense: 1 each; Refill: [place], [name]

## 2022-12-17 NOTE — Patient Instructions (Signed)

## 2023-01-20 ENCOUNTER — Other Ambulatory Visit: Payer: Self-pay | Admitting: Family Medicine

## 2023-01-20 DIAGNOSIS — F411 Generalized anxiety disorder: Secondary | ICD-10-CM

## 2023-03-07 ENCOUNTER — Telehealth: Payer: BC Managed Care – PPO

## 2023-03-10 ENCOUNTER — Ambulatory Visit (INDEPENDENT_AMBULATORY_CARE_PROVIDER_SITE_OTHER): Payer: BC Managed Care – PPO | Admitting: Family Medicine

## 2023-03-10 ENCOUNTER — Encounter: Payer: Self-pay | Admitting: Family Medicine

## 2023-03-10 VITALS — BP 107/72 | HR 83 | Ht 64.0 in | Wt 188.0 lb

## 2023-03-10 DIAGNOSIS — J329 Chronic sinusitis, unspecified: Secondary | ICD-10-CM

## 2023-03-10 MED ORDER — PREDNISONE 20 MG PO TABS: ORAL_TABLET | ORAL | 0 refills | Status: DC

## 2023-03-10 NOTE — Progress Notes (Signed)
BP 107/72   Pulse 83   Ht 5\' 4"  (1.626 m)   Wt 188 lb (85.3 kg)   SpO2 98%   BMI 32.27 kg/m    Subjective:   Patient ID: Kelly [name], female    DOB: 11/19/1986, 37 y.o.   MRN: [id number]  HPI: Kelly [name] is a 37 y.o. female presenting on 03/10/2023 for URI (Chest congestion. Chest hurts. States that she has URI symptoms all the time. ? Referral to ENT.)   HPI Cough and sinus pressure and congestion Patient is coming in today for cough and sinus congestion and pressure.  She says it has been something that she has been fighting for some time recurrently in the spring and the fall.  She says she takes her antihistamine but she is not able to do the nasal sprays because they give her headaches.  She says it has been going on for years.  She denies any fevers or chills.  She did just do a course a couple weeks of Augmentin and it did not improve but then it came back about 5 days ago.  She says is what she fights in the spring and the fall every year.  She denies any sick contacts that she knows of.  She says right now currently she has a lot of pressure on the nasal bridge and feels like her nose is swollen on either side.  Relevant past medical, surgical, family and social history reviewed and updated as indicated. Interim medical history since our last visit reviewed. Allergies and medications reviewed and updated.  Review of Systems  Constitutional:  Negative for chills and fever.  HENT:  Positive for congestion and sinus pressure. Negative for ear discharge, ear pain, sore throat, tinnitus and trouble swallowing.   Eyes:  Negative for redness and visual disturbance.  Respiratory:  Positive for cough. Negative for chest tightness and shortness of breath.   Cardiovascular:  Negative for chest pain and leg swelling.  Genitourinary:  Negative for difficulty urinating and dysuria.  Musculoskeletal:  Negative for back pain and gait problem.  Skin:  Negative for  rash.  Neurological:  Positive for headaches. Negative for light-headedness.  Psychiatric/Behavioral:  Negative for agitation and behavioral problems.   All other systems reviewed and are negative.   Per HPI unless specifically indicated above   Allergies as of 03/10/2023   No Known Allergies      Medication List        Accurate as of March 10, 2023 11:20 AM. If you have any questions, ask your nurse or doctor.          STOP taking these medications    amoxicillin-clavulanate 875-125 MG tablet Commonly known as: AUGMENTIN Stopped by: Elige Radon Zarek Relph, MD   mometasone 50 MCG/ACT nasal spray Commonly known as: Nasonex Stopped by: Elige Radon Yazen Rosko, MD       TAKE these medications    aspirin EC 81 MG tablet Take by mouth.   cetirizine 10 MG tablet Commonly known as: ZyrTEC Allergy Take 1 tablet (10 mg total) by mouth daily.   predniSONE 20 MG tablet Commonly known as: DELTASONE 2 po at same time daily for 5 days Started by: Elige Radon Martena Emanuele, MD   rosuvastatin 20 MG tablet Commonly known as: CRESTOR Take 1 tablet by mouth daily.   sertraline 50 MG tablet Commonly known as: ZOLOFT Take 1 tablet (50 mg total) by mouth daily.         Objective:  BP 107/72   Pulse 83   Ht 5\' 4"  (1.626 m)   Wt 188 lb (85.3 kg)   SpO2 98%   BMI 32.27 kg/m   Wt Readings from Last 3 Encounters:  03/10/23 188 lb (85.3 kg)  12/17/22 189 lb 6.4 oz (85.9 kg)  10/09/22 191 lb 3.2 oz (86.7 kg)    Physical Exam Vitals reviewed.  Constitutional:      General: She is not in acute distress.    Appearance: She is well-developed. She is not diaphoretic.  HENT:     Right Ear: Tympanic membrane, ear canal and external ear normal.     Left Ear: Tympanic membrane, ear canal and external ear normal.     Nose: Nasal tenderness and mucosal edema present. No rhinorrhea.     Right Sinus: Maxillary sinus tenderness present. No frontal sinus tenderness.     Left Sinus:  Maxillary sinus tenderness present. No frontal sinus tenderness.     Mouth/Throat:     Pharynx: Uvula midline. No oropharyngeal exudate or posterior oropharyngeal erythema.     Tonsils: No tonsillar abscesses.  Eyes:     Conjunctiva/sclera: Conjunctivae normal.  Cardiovascular:     Rate and Rhythm: Normal rate and regular rhythm.     Heart sounds: Normal heart sounds. No murmur heard. Pulmonary:     Effort: Pulmonary effort is normal. No respiratory distress.     Breath sounds: Normal breath sounds. No wheezing.  Musculoskeletal:        General: No tenderness. Normal range of motion.  Skin:    General: Skin is warm and dry.     Findings: No rash.  Neurological:     Mental Status: She is alert and oriented to person, place, and time.     Coordination: Coordination normal.  Psychiatric:        Behavior: Behavior normal.       Assessment & Plan:   Problem List Items Addressed This Visit   None Visit Diagnoses     Recurrent sinusitis    -  Primary   Relevant Medications   predniSONE (DELTASONE) 20 MG tablet   Other Relevant Orders   Ambulatory referral to ENT       Likely chronic allergies, will give short course of prednisone and refer her to ENT.  Discussed possibly starting Singulair but she wants to go see ENT first. Follow up plan: Return if symptoms worsen or fail to improve.  Counseling provided for all of the vaccine components Orders Placed This Encounter  Procedures   Ambulatory referral to ENT    Arville Care, MD [facility] 03/10/2023, 11:20 AM

## 2023-03-19 ENCOUNTER — Other Ambulatory Visit: Payer: Self-pay | Admitting: Family Medicine

## 2023-03-19 DIAGNOSIS — F411 Generalized anxiety disorder: Secondary | ICD-10-CM

## 2023-05-20 ENCOUNTER — Other Ambulatory Visit: Payer: Self-pay | Admitting: Family Medicine

## 2023-05-20 DIAGNOSIS — F411 Generalized anxiety disorder: Secondary | ICD-10-CM

## 2023-05-21 NOTE — Telephone Encounter (Signed)
Appt made for 07/04/23

## 2023-05-21 NOTE — Telephone Encounter (Signed)
Gottschalk NTBS 30 days given 03/19/23

## 2023-06-20 IMAGING — US US EXTREM LOW VENOUS*R*
1 series · 13 of 24 positions shown · non-contrast
Comparison: None.

CLINICAL DATA: History of DVT, now with right lower extremity pain
and edema. Evaluate for acute or chronic DVT.



[Series 1: us extrem low venous*right* · 0.08mm/px · 13 of 41 slices shown]
[im 1/41]
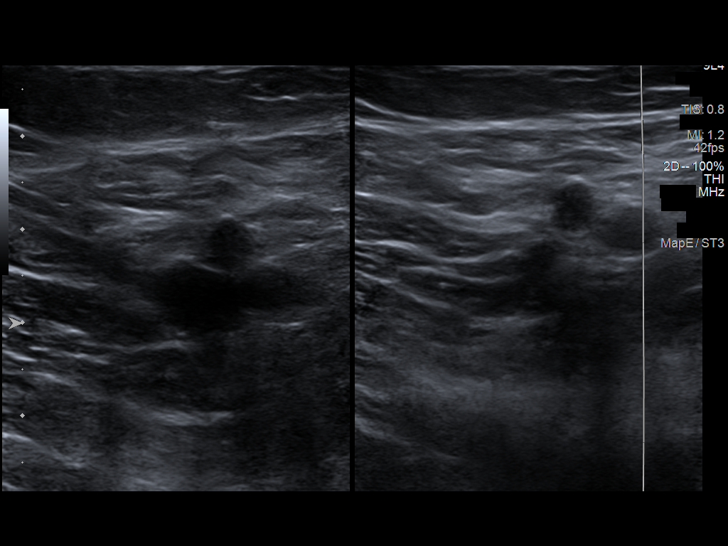
[im 4/41]
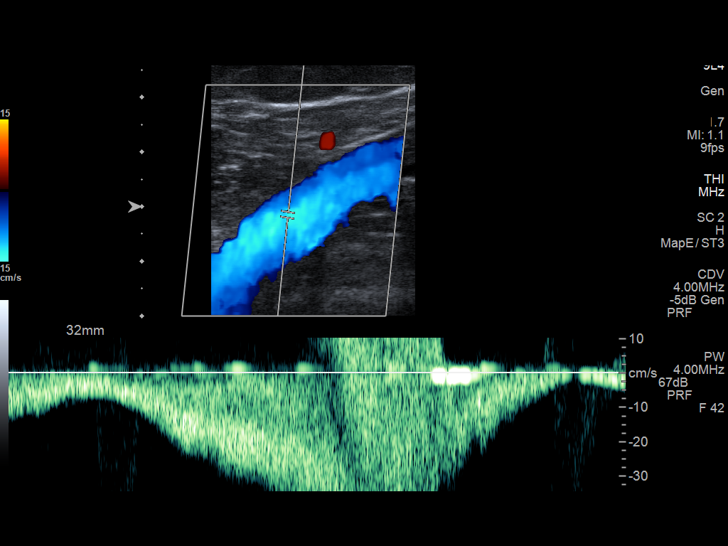
[im 7/41]
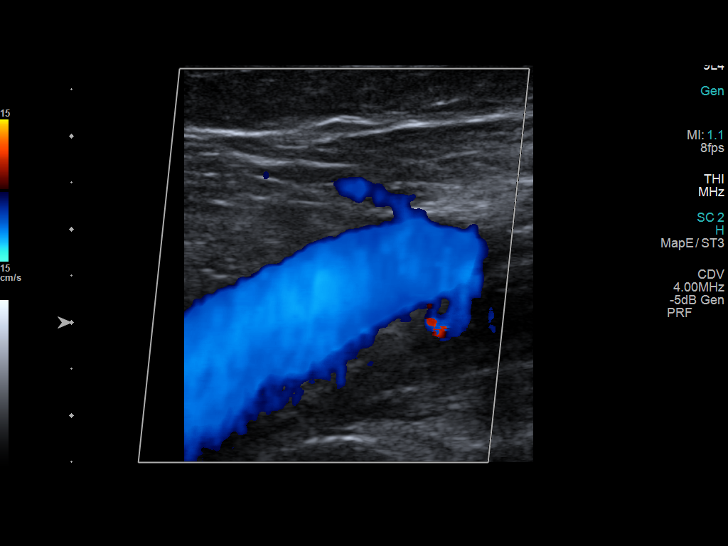
[im 11/41]
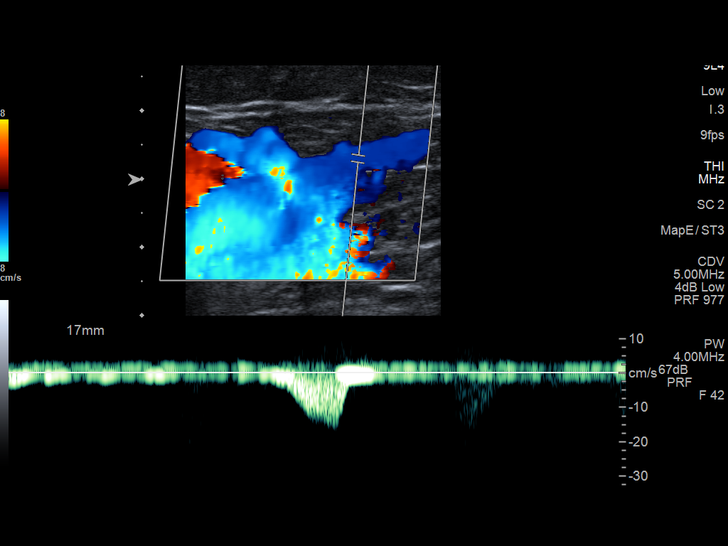
[im 14/41]
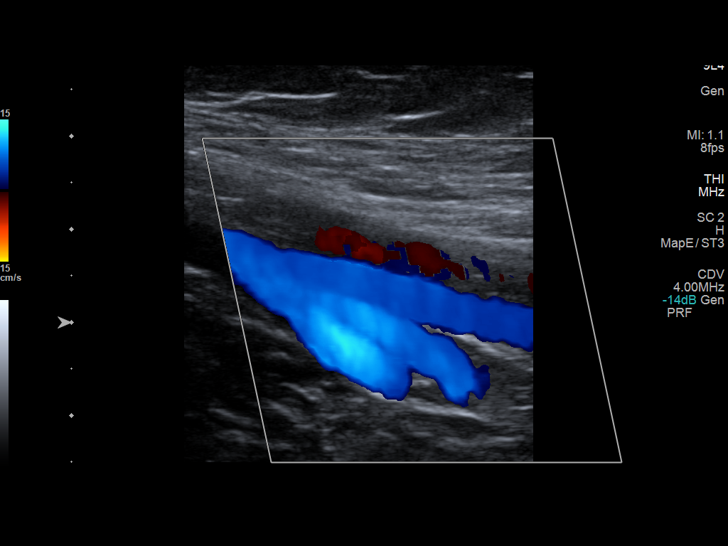
[im 18/41]
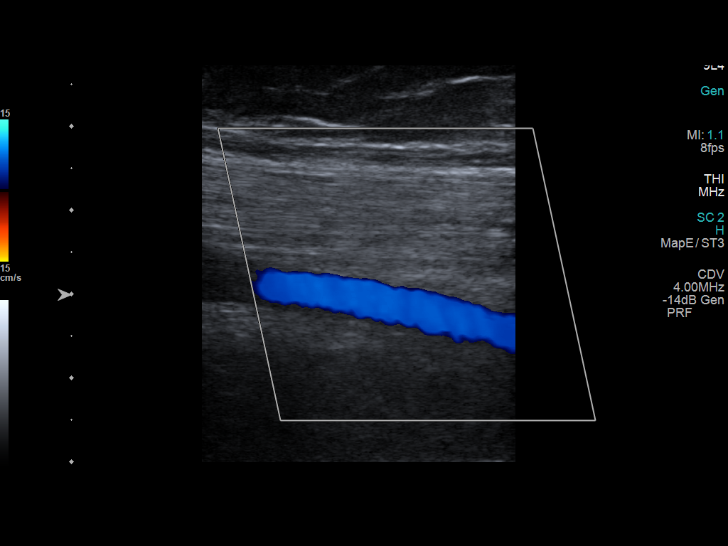
[im 21/41]
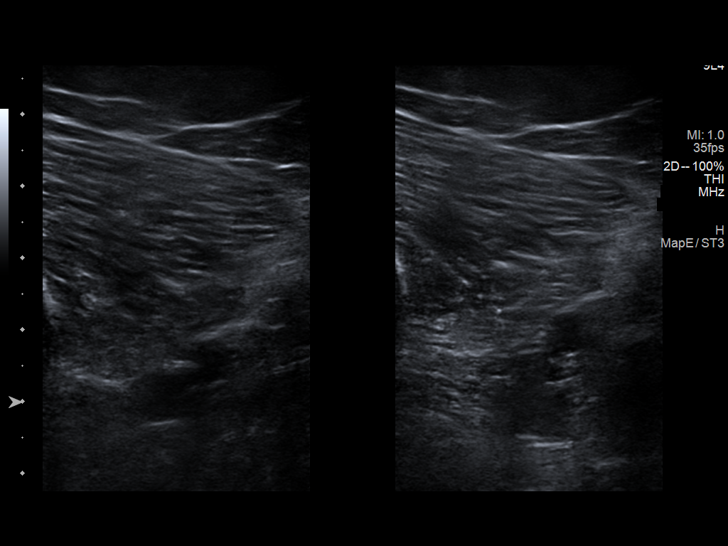
[im 23/41]
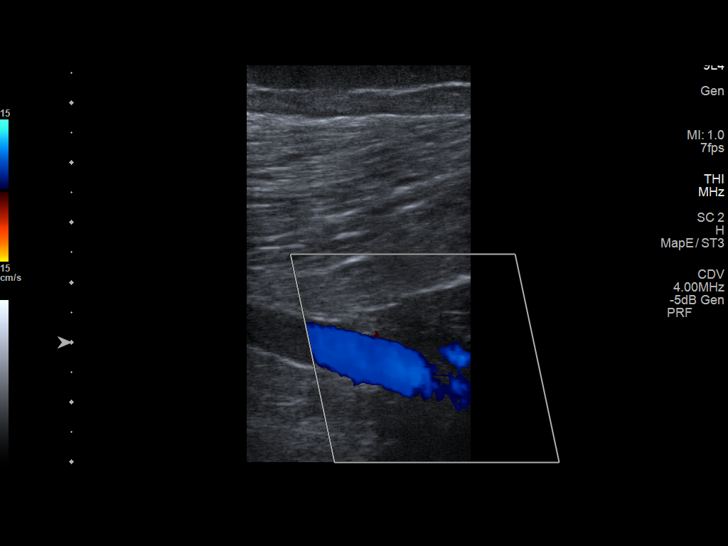
[im 27/41]
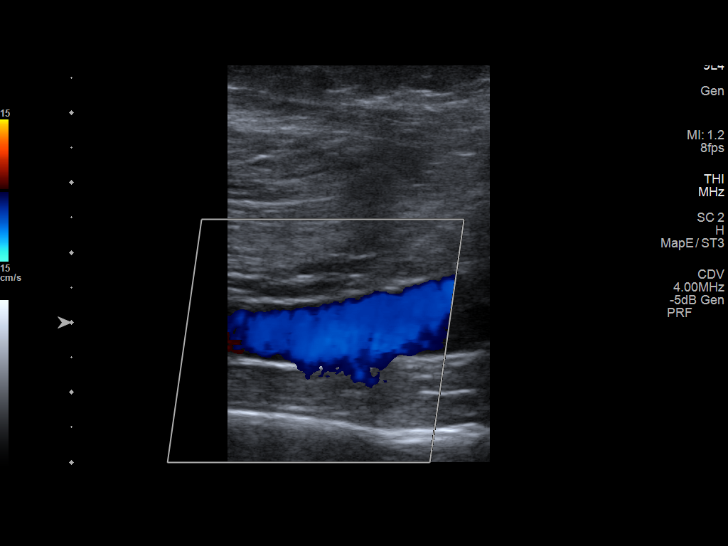
[im 30/41]
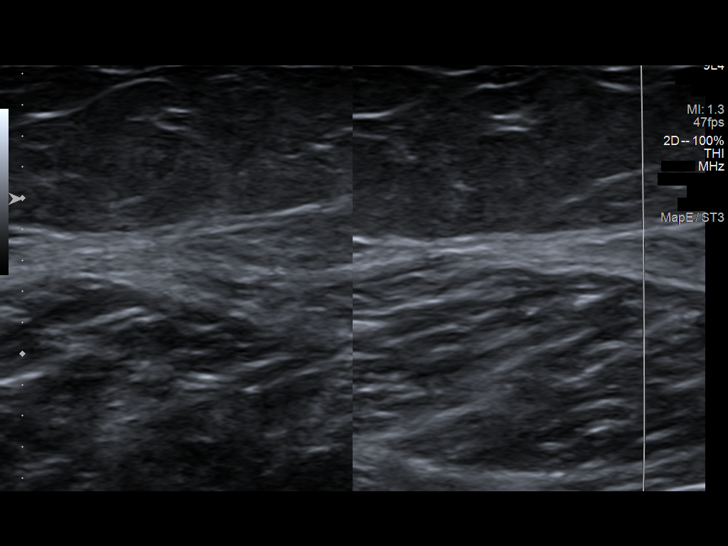
[im 34/41]
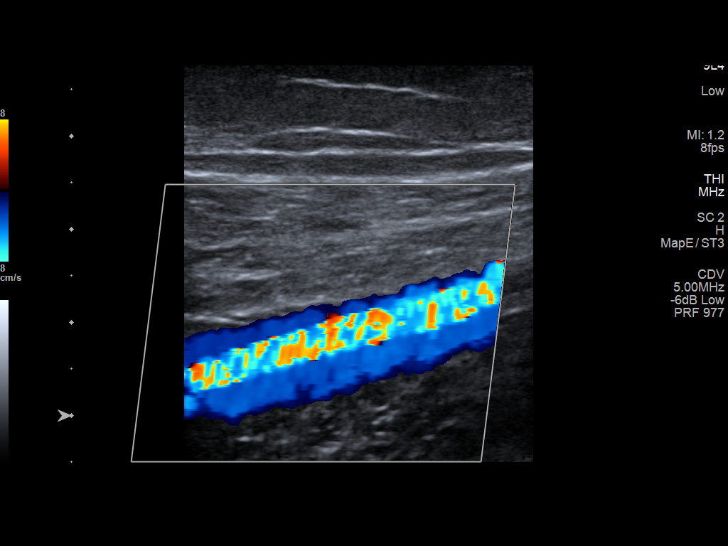
[im 37/41]
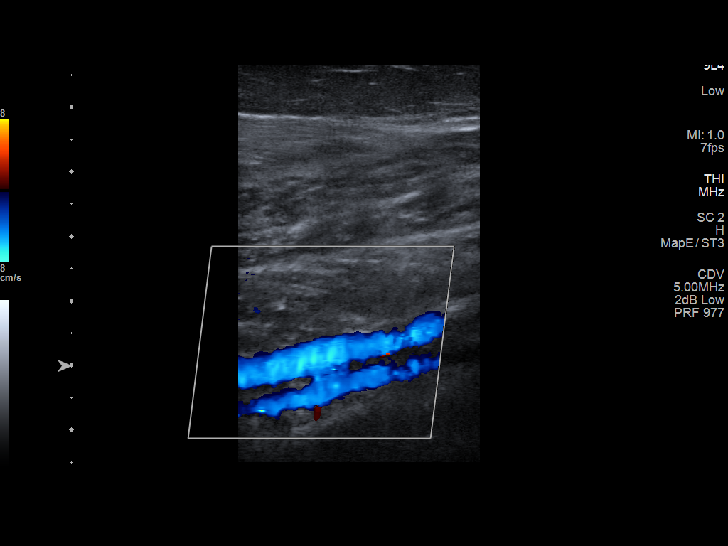
[im 41/41]
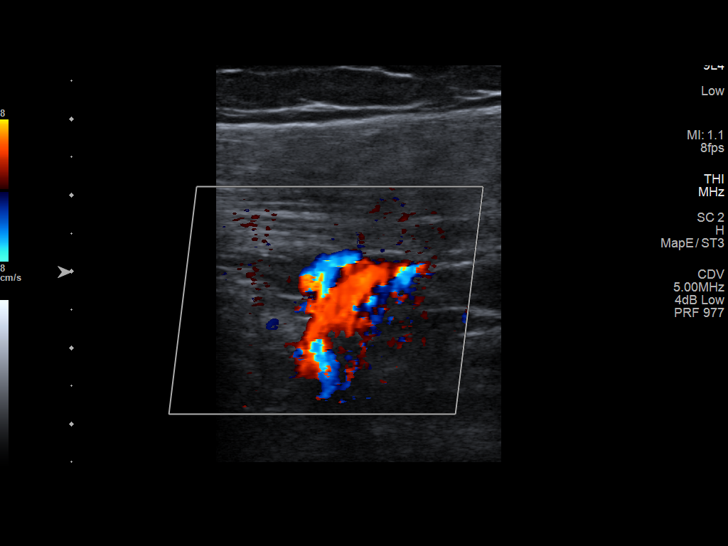

[13 of 24 positions shown; findings below may reference images not displayed]

FINDINGS: Contralateral Common Femoral Vein: Respiratory phasicity is normal
and symmetric with the symptomatic side. No evidence of thrombus.
Normal compressibility.

Common Femoral Vein: No evidence of thrombus. Normal
compressibility, respiratory phasicity and response to augmentation.

Saphenofemoral Junction: No evidence of thrombus. Normal
compressibility and flow on color Doppler imaging.

Profunda Femoral Vein: No evidence of thrombus. Normal
compressibility and flow on color Doppler imaging.

Femoral Vein: No evidence of thrombus. Normal compressibility,
respiratory phasicity and response to augmentation.

Popliteal Vein: No evidence of thrombus. Normal compressibility,
respiratory phasicity and response to augmentation.

Calf Veins: No evidence of thrombus. Normal compressibility and flow
on color Doppler imaging.

Superficial Great Saphenous Vein: No evidence of thrombus. Normal
compressibility.

Venous Reflux:  None.

Other Findings:  None.
IMPRESSION: No evidence of acute or chronic DVT within the right lower
extremity.

## 2023-07-04 ENCOUNTER — Encounter: Payer: Self-pay | Admitting: Family Medicine

## 2023-07-04 ENCOUNTER — Ambulatory Visit: Payer: BC Managed Care – PPO | Admitting: Family Medicine

## 2023-07-04 VITALS — BP 99/59 | HR 79 | Ht 64.0 in | Wt 183.0 lb

## 2023-07-04 DIAGNOSIS — L659 Nonscarring hair loss, unspecified: Secondary | ICD-10-CM

## 2023-07-04 DIAGNOSIS — F411 Generalized anxiety disorder: Secondary | ICD-10-CM

## 2023-07-04 DIAGNOSIS — E782 Mixed hyperlipidemia: Secondary | ICD-10-CM

## 2023-07-04 DIAGNOSIS — Z8632 Personal history of gestational diabetes: Secondary | ICD-10-CM | POA: Diagnosis not present

## 2023-07-04 MED ORDER — SERTRALINE HCL 25 MG PO TABS: ORAL_TABLET | ORAL | 0 refills | Status: DC

## 2023-07-04 MED ORDER — ROSUVASTATIN CALCIUM 20 MG PO TABS
20.0000 mg | ORAL_TABLET | Freq: Every day | ORAL | 3 refills | Status: DC
Start: 2023-07-04 — End: 2023-07-07

## 2023-07-04 NOTE — Patient Instructions (Signed)
Some good alternatives: Farro Quinoa Wild rice Spinach Kale Collards

## 2023-07-04 NOTE — Progress Notes (Signed)
Subjective: ZO:XWRUEAV follow up PCP: Raliegh Ip, DO WUJ:[id number] Kelly [name] is a 37 y.o. female presenting to clinic today for:  1. GAD Reports stability of symptoms.  She has self reduced to 25mg  daily on zoloft.  Doing well. Job is less stressful. 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  She wants to try and taper off of the Zoloft  2.  Hair thinning She reports that she has been having gradual hair thinning.  It feels almost as if her hair was thinning after she had her baby.  She denies any significant weight loss precipitating, no changes in water or hard water.  Denies any new medications.  She has been trying to take a prenatal vitamin in efforts to alleviate some of the hair loss.  She has been taking measures to braid hair rather than putting it in ponytail to reduce stress on the hair.  3.  Hyperlipidemia Patient is compliant with Crestor.  She would like to have cholesterol checked.  Nonfasting today.  Actively working on improved health with diet and exercise.  No reports of chest pain, shortness of breath or change in exercise tolerance   ROS: Per HPI  No Known Allergies Past Medical History:  Diagnosis Date   Anxiety    Headache    Missed abortion 05/2015   no surgery required   PONV (postoperative nausea and vomiting)    Stroke [facility])     Current Outpatient Medications:    aspirin 81 MG EC tablet, Take by mouth., Disp: , Rfl:    cetirizine (ZYRTEC ALLERGY) 10 MG tablet, Take 1 tablet (10 mg total) by mouth daily., Disp: 90 tablet, Rfl: 1   rosuvastatin (CRESTOR) 20 MG tablet, Take 1 tablet (20 mg total) by mouth daily., Disp: 90 tablet, Rfl: 3   sertraline (ZOLOFT) 25 MG tablet, Take 1/2 tablet daily x4 weeks.  Then 1/2 tablet every other day x2 weeks. Then stop., Disp: 30 tablet, Rfl: 0 Social History   Socioeconomic History   Marital status: Married    Spouse name: Not on file    Number of children: Not on file   Years of education: Not on file   Highest education level: Not on file  Occupational History   Not on file  Tobacco Use   Smoking status: Never   Smokeless tobacco: Never  Vaping Use   Vaping status: Never Used  Substance and Sexual Activity   Alcohol use: No   Drug use: No   Sexual activity: Not on file    Comment: approx 12-[redacted] wks gestation per patient 12/22/17  Other Topics Concern   Not on file  Social History Narrative   Not on file   Social Determinants of Health   Financial Resource Strain: Low Risk  (12/31/2018)   Overall Financial Resource Strain (CARDIA)    Difficulty of Paying Living Expenses: Not hard at all  Food Insecurity: Low Risk  (04/17/2023)   [facility]   Food vital sign    Within the past 12 months, you worried that your food would run out before you got money to buy more: Never true    Within the past 12 months, the food you bought just didn't last and you didn't have money to get more. : Never true  Transportation Needs: Not on file (04/17/2023)  Physical Activity: Not on file  Stress: No Stress  Concern Present (12/31/2018)   [employer] of Occupational Health - Occupational Stress Questionnaire    Feeling of Stress : Only a little  Social Connections: Not on file  Intimate Partner Violence: Not At Risk (12/31/2018)   Humiliation, Afraid, Rape, and Kick questionnaire    Fear of Current or Ex-Partner: No    Emotionally Abused: No    Physically Abused: No    Sexually Abused: No   Family History  Problem Relation Age of Onset   Healthy Mother    Cancer Father        skin   Cancer Other    Cancer Maternal Aunt    Hypertension Maternal Uncle    Hypertension Maternal Grandfather     Objective: Office vital signs reviewed. BP (!) 99/59   Pulse 79   Ht 5\' 4"  (1.626 m)   Wt 183 lb (83 kg)   SpO2 98%   BMI 31.41 kg/m   Physical Examination:  General: Awake, alert, well  nourished, No acute distress HEENT: sclera white, MMM.  No thyromegaly.  No goiter.  No exophthalmos.  No patchy alopecia appreciated Cardio: regular rate and rhythm, S1S2 heard, no murmurs appreciated Pulm: clear to auscultation bilaterally, no wheezes, rhonchi or rales; normal work of breathing on room air    Assessment/ Plan: 37 y.o. female   Hair thinning - Plan: TSH, T4, free, CBC, Iron, TIBC and Ferritin Panel, Magnesium, ANA w/Reflex if Positive  GAD (generalized anxiety disorder) - Plan: sertraline (ZOLOFT) 25 MG tablet  Mixed hyperlipidemia - Plan: CMP14+EGFR, Lipid panel, TSH, rosuvastatin (CRESTOR) 20 MG tablet  History of gestational diabetes - Plan: Bayer DCA Hb A1c Waived  Check levels for thyroid, vitamins etc.  She will schedule an appointment and come off of her prenatal vitamins for 2 weeks prior to collection of labs so as not to interfere with the TSH through [facility].  We discussed incorporating balanced diet and efforts to promote hair growth.  We discussed where again versus naturopathic remedies for hair growth.  Okay to start tapering from Zoloft if she would like.  Reduce to 12.5 mg daily for 4 weeks then can do 12.5 mg every other day for 2 weeks then off  Recheck lipid.  Continue Crestor  Check A1c given history of gestational diabetes  Orders Placed This Encounter  Procedures   CMP14+EGFR    Standing Status:   Future    Standing Expiration Date:   07/03/2024   Lipid panel    Standing Status:   Future    Standing Expiration Date:   07/03/2024   TSH    Standing Status:   Future    Standing Expiration Date:   07/03/2024   T4, free    Standing Status:   Future    Standing Expiration Date:   07/03/2024   CBC    Standing Status:   Future    Standing Expiration Date:   07/03/2024   Iron, TIBC and Ferritin Panel    Standing Status:   Future    Standing Expiration Date:   07/03/2024   Magnesium    Standing Status:   Future    Standing Expiration Date:   07/03/2024    ANA w/Reflex if Positive    Standing Status:   Future    Standing Expiration Date:   07/03/2024   Bayer DCA Hb A1c Waived    Standing Status:   Future    Standing Expiration Date:   07/03/2024   Meds ordered this  encounter  Medications   sertraline (ZOLOFT) 25 MG tablet    Sig: Take 1/2 tablet daily x4 weeks.  Then 1/2 tablet every other day x2 weeks. Then stop.    Dispense:  30 tablet    Refill:  0   rosuvastatin (CRESTOR) 20 MG tablet    Sig: Take 1 tablet (20 mg total) by mouth daily.    Dispense:  90 tablet    Refill:  3     Casaundra Takacs Hulen Skains, DO [facility] [phone]

## 2023-07-07 MED ORDER — ROSUVASTATIN CALCIUM 20 MG PO TABS: 20.0000 mg | ORAL_TABLET | Freq: Every day | ORAL | 3 refills | Status: DC

## 2023-07-07 MED ORDER — SERTRALINE HCL 25 MG PO TABS
ORAL_TABLET | ORAL | 0 refills | Status: DC
Start: 2023-07-07 — End: 2023-12-23

## 2023-07-07 NOTE — Progress Notes (Signed)
Refills failed. resent 

## 2023-07-07 NOTE — Addendum Note (Signed)
Addended by: Julious Payer D on: 07/07/2023 09:25 AM   Modules accepted: Orders

## 2023-07-17 ENCOUNTER — Other Ambulatory Visit: Payer: BC Managed Care – PPO

## 2023-07-17 DIAGNOSIS — Z8632 Personal history of gestational diabetes: Secondary | ICD-10-CM

## 2023-07-17 DIAGNOSIS — L659 Nonscarring hair loss, unspecified: Secondary | ICD-10-CM

## 2023-07-17 DIAGNOSIS — E782 Mixed hyperlipidemia: Secondary | ICD-10-CM

## 2023-07-17 LAB — BAYER DCA HB A1C WAIVED: HB A1C (BAYER DCA - WAIVED): 4.4 % — ABNORMAL LOW (ref 4.8–5.6)

## 2023-07-18 ENCOUNTER — Other Ambulatory Visit: Payer: BC Managed Care – PPO

## 2023-07-18 LAB — CMP14+EGFR
ALT: 14 IU/L (ref 0–32)
AST: 19 IU/L (ref 0–40)
Albumin: 4.4 g/dL (ref 3.9–4.9)
Alkaline Phosphatase: 77 IU/L (ref 44–121)
BUN/Creatinine Ratio: 23 (ref 9–23)
BUN: 16 mg/dL (ref 6–20)
Bilirubin Total: 1.1 mg/dL (ref 0.0–1.2)
CO2: 23 mmol/L (ref 20–29)
Calcium: 9.1 mg/dL (ref 8.7–10.2)
Chloride: 102 mmol/L (ref 96–106)
Creatinine, Ser: 0.7 mg/dL (ref 0.57–1.00)
Globulin, Total: 2.5 g/dL (ref 1.5–4.5)
Glucose: 95 mg/dL (ref 70–99)
Potassium: 4.5 mmol/L (ref 3.5–5.2)
Sodium: 140 mmol/L (ref 134–144)
Total Protein: 6.9 g/dL (ref 6.0–8.5)
eGFR: 113 mL/min/{1.73_m2} (ref >59)

## 2023-07-18 LAB — LIPID PANEL
Chol/HDL Ratio: 3.1 ratio (ref 0.0–4.4)
Cholesterol, Total: 147 mg/dL (ref 100–199)
HDL: 47 mg/dL (ref >39)
LDL Chol Calc (NIH): 72 mg/dL (ref 0–99)
Triglycerides: 162 mg/dL — ABNORMAL HIGH (ref 0–149)
VLDL Cholesterol Cal: 28 mg/dL (ref 5–40)

## 2023-07-18 LAB — CBC
Hematocrit: 38.5 % (ref 34.0–46.6)
Hemoglobin: 12.7 g/dL (ref 11.1–15.9)
MCH: 29.7 pg (ref 26.6–33.0)
MCHC: 33 g/dL (ref 31.5–35.7)
MCV: 90 fL (ref 79–97)
Platelets: 195 10*3/uL (ref 150–450)
RBC: 4.28 x10E6/uL (ref 3.77–5.28)
RDW: 13.2 % (ref 11.7–15.4)
WBC: 4.3 10*3/uL (ref 3.4–10.8)

## 2023-07-18 LAB — IRON,TIBC AND FERRITIN PANEL
Ferritin: 35 ng/mL (ref 15–150)
Iron Saturation: 28 % (ref 15–55)
Iron: 109 ug/dL (ref 27–159)
Total Iron Binding Capacity: 391 ug/dL (ref 250–450)
UIBC: 282 ug/dL (ref 131–425)

## 2023-07-18 LAB — MAGNESIUM: Magnesium: 2.3 mg/dL (ref 1.6–2.3)

## 2023-07-18 LAB — T4, FREE: Free T4: 1.31 ng/dL (ref 0.82–1.77)

## 2023-07-18 LAB — TSH: TSH: 1.53 u[IU]/mL (ref 0.450–4.500)

## 2023-07-18 LAB — ANA W/REFLEX IF POSITIVE: Anti Nuclear Antibody (ANA): NEGATIVE

## 2023-08-26 ENCOUNTER — Encounter: Payer: Self-pay | Admitting: Family Medicine

## 2023-08-27 ENCOUNTER — Ambulatory Visit: Payer: BC Managed Care – PPO | Admitting: Family Medicine

## 2023-08-27 ENCOUNTER — Encounter: Payer: Self-pay | Admitting: Family Medicine

## 2023-08-27 VITALS — BP 106/61 | HR 78 | Temp 97.3°F | Ht 64.0 in | Wt 183.1 lb

## 2023-08-27 DIAGNOSIS — N644 Mastodynia: Secondary | ICD-10-CM | POA: Diagnosis not present

## 2023-08-27 NOTE — Progress Notes (Signed)
Acute Office Visit  Subjective:     Patient ID: Kelly [name], female    DOB: 02-Nov-1986, 37 y.o.   MRN: [id number]  Chief Complaint  Patient presents with   Muscle Pain    HPI Patient is in today for soreness under bother of her axillas. It is a constant ache. Pain worsens with activity, lifting. She also reports generalized breast tenderness bilaterally. She denies changes in skin, nipple pain or discharge. She has been lifting her children more than usual. She has been applying heat and taking tylenol prn with relief.   ROS As per HPI.      Objective:    BP 106/61   Pulse 78   Temp (!) 97.3 F (36.3 C) (Temporal)   Ht 5\' 4"  (1.626 m)   Wt 183 lb 2 oz (83.1 kg)   SpO2 96%   BMI 31.43 kg/m    Physical Exam Vitals and nursing note reviewed.  Constitutional:      General: She is not in acute distress.    Appearance: She is not ill-appearing, toxic-appearing or diaphoretic.  Pulmonary:     Effort: Pulmonary effort is normal. No respiratory distress.  Chest:     Chest wall: No mass, deformity, swelling, tenderness or edema.  Breasts:    Breasts are symmetrical.     Right: Normal.     Left: Normal.  Lymphadenopathy:     Upper Body:     Right upper body: No supraclavicular, axillary or pectoral adenopathy.     Left upper body: No supraclavicular, axillary or pectoral adenopathy.  Skin:    General: Skin is warm and dry.  Neurological:     General: No focal deficit present.     Mental Status: She is alert and oriented to person, place, and time.  Psychiatric:        Mood and Affect: Mood normal.        Behavior: Behavior normal.     No results found for any visits on 08/27/23.      Assessment & Plan:   Christain was seen today for muscle pain.  Diagnoses and all orders for this visit:  Breast pain Normal exam. Discussed likely muscle pain from increase lifting of her children lately. Discussed advil, heat, rest. Discussed mammogram if  symptoms persist or worsen. Return to office for new or worsening symptoms, or if symptoms persist.    The patient indicates understanding of these issues and agrees with the plan.   Gabriel Earing, [name]

## 2023-09-05 ENCOUNTER — Telehealth: Payer: Self-pay

## 2023-09-05 ENCOUNTER — Ambulatory Visit: Payer: BC Managed Care – PPO | Admitting: Family Medicine

## 2023-09-05 NOTE — Telephone Encounter (Signed)
Called left vm to get apt r/s

## 2023-09-11 ENCOUNTER — Ambulatory Visit: Payer: BC Managed Care – PPO | Admitting: Family Medicine

## 2023-11-27 ENCOUNTER — Encounter: Payer: Self-pay | Admitting: Family Medicine

## 2023-11-27 ENCOUNTER — Telehealth: Payer: BC Managed Care – PPO | Admitting: Family Medicine

## 2023-11-27 ENCOUNTER — Encounter (INDEPENDENT_AMBULATORY_CARE_PROVIDER_SITE_OTHER): Payer: BC Managed Care – PPO | Admitting: Family Medicine

## 2023-11-27 DIAGNOSIS — J029 Acute pharyngitis, unspecified: Secondary | ICD-10-CM

## 2023-11-27 DIAGNOSIS — J069 Acute upper respiratory infection, unspecified: Secondary | ICD-10-CM | POA: Diagnosis not present

## 2023-11-27 LAB — RAPID STREP SCREEN (MED CTR MEBANE ONLY): Strep Gp A Ag, IA W/Reflex: NEGATIVE

## 2023-11-27 LAB — CULTURE, GROUP A STREP

## 2023-11-27 NOTE — Progress Notes (Signed)
Virtual Visit via Video   I connected with patient on 11/27/23 at 0815 by a video enabled telemedicine application and verified that I am speaking with the correct person using two identifiers.  Location patient: Home Location provider: [facility] Office Persons participating in the virtual visit: Patient and Provider  I discussed the limitations of evaluation and management by telemedicine and the availability of in person appointments. The patient expressed understanding and agreed to proceed.  Subjective:   HPI:  Pt presents today for  Chief Complaint  Patient presents with   Sore Throat   Sore Throat  This is a new problem. The current episode started in the past 7 days. The problem has been waxing and waning. Neither side of throat is experiencing more pain than the other. There has been no fever. The pain is mild. Associated symptoms include congestion, coughing, ear pain and headaches. Pertinent negatives include no abdominal pain, diarrhea, drooling, ear discharge, hoarse voice, plugged ear sensation, neck pain, shortness of breath, stridor, swollen glands, trouble swallowing or vomiting. She has tried nothing for the symptoms.     Review of Systems  Constitutional:  Negative for chills, diaphoresis, fever, malaise/fatigue and weight loss.  HENT:  Positive for congestion, ear pain and sore throat. Negative for drooling, ear discharge, hearing loss, hoarse voice, nosebleeds, sinus pain, tinnitus and trouble swallowing.   Respiratory:  Positive for cough. Negative for shortness of breath and stridor.   Cardiovascular:  Negative for chest pain, palpitations, orthopnea, claudication, leg swelling and PND.  Gastrointestinal:  Negative for abdominal pain, diarrhea and vomiting.  Musculoskeletal:  Negative for neck pain.  Neurological:  Positive for headaches. Negative for dizziness, tingling, tremors, sensory change, focal weakness, seizures, loss of  consciousness and weakness.  All other systems reviewed and are negative.    Patient Active Problem List   Diagnosis Date Noted   History of CVA (cerebrovascular accident) 09/06/2021   GAD (generalized anxiety disorder) 09/06/2021   PFO (patent foramen ovale) 09/06/2021   S/P tubal ligation 05/21/2021   History of gestational diabetes 12/26/2020   Previous cesarean section 01/11/2019    Social History   Tobacco Use   Smoking status: Never   Smokeless tobacco: Never  Substance Use Topics   Alcohol use: No    Current Outpatient Medications:    aspirin 81 MG EC tablet, Take by mouth., Disp: , Rfl:    cetirizine (ZYRTEC ALLERGY) 10 MG tablet, Take 1 tablet (10 mg total) by mouth daily., Disp: 90 tablet, Rfl: 1   rosuvastatin (CRESTOR) 20 MG tablet, Take 1 tablet (20 mg total) by mouth daily., Disp: 90 tablet, Rfl: 3   sertraline (ZOLOFT) 25 MG tablet, Take 1/2 tablet daily x4 weeks.  Then 1/2 tablet every other day x2 weeks. Then stop., Disp: 30 tablet, Rfl: 0  No Known Allergies  Objective:   There were no vitals taken for this visit.  Patient is well-developed, well-nourished in no acute distress.  Resting comfortably at home.  Head is normocephalic, atraumatic.  No labored breathing.  Speech is clear and coherent with logical content.  Patient is alert and oriented at baseline.   Assessment and Plan:   Kelly [name] was seen today for sore throat.  Diagnoses and all orders for this visit:  Sore throat Strep negative. Symptomatic care discussed in detail. Aware to report new, worsening, or persistent symptoms.  -     Rapid Strep Screen ([facility] ONLY)  URI with cough and  congestion It appears that you have a viral upper respiratory infection (cold).  Cold symptoms can last up to 2 weeks.   - Get plenty of rest and drink plenty of fluids. - Try to breathe moist air. Use a cold mist humidifier. - Consume warm fluids (soup or tea) to provide relief for a stuffy  nose and to loosen phlegm. - For cough and congestion you can use plain Mucinex, regular or max strength, follow box directions.  - For nasal stuffiness, try saline nasal spray or a Neti Pot. You can use saline nasal spray 4 times daily. Do not use tap water in the [facility], follow instructions on box for proper use. Afrin nasal spray can also be used but this product should not be used longer than 3 days or it will cause rebound nasal stuffiness (worsening nasal congestion). - For sore throat pain relief: use chloraseptic spray, suck on throat lozenges, hard candy or popsicles; gargle with warm salt water (1/4 tsp. salt per 8 oz. of water); and eat soft, bland foods. - For fever or aches and pains take tylenol or motrin as appropriate for age and weight.  - Eat a well-balanced diet. If you cannot, ensure you are getting enough nutrients by taking a daily multivitamin. - Avoid dairy products, as they can thicken phlegm. - Avoid alcohol, as it impairs your body's immune system. - Change your toothbrush in 3 days.   CONTACT YOUR DOCTOR IF YOU EXPERIENCE ANY OF THE FOLLOWING: - High fever - Ear pain - Sinus-type headache - Unusually severe cold symptoms - Cough that gets worse while other cold symptoms improve - Flare up of any chronic lung problem, such as asthma or COPD - Your symptoms persist longer than 2 weeks      Return if symptoms worsen or fail to improve.  [name], [name]-C [facility] [address] [place], [place] [address] [phone]  11/27/2023  Time spent with the patient: 15 minutes, of which >50% was spent in obtaining information about symptoms, reviewing previous labs, evaluations, and treatments, counseling about condition (please see the discussed topics above), and developing a plan to further investigate it; had a number of questions which I addressed.

## 2023-11-28 NOTE — Progress Notes (Signed)
err

## 2023-12-04 NOTE — Progress Notes (Deleted)
   Acute Office Visit  Subjective:     Patient ID: Kelly [name], female    DOB: 07-Nov-1986, 38 y.o.   MRN: [id number]  No chief complaint on file.   HPI .otitis ROS Negative unless indicated in HPI    Objective:    There were no vitals taken for this visit. BP Readings from Last 3 Encounters:  08/27/23 106/61  07/04/23 (!) 99/59  03/10/23 107/72   Wt Readings from Last 3 Encounters:  08/27/23 183 lb 2 oz (83.1 kg)  07/04/23 183 lb (83 kg)  03/10/23 188 lb (85.3 kg)      Physical Exam  No results found for any visits on 12/09/23.      Assessment & Plan:  There are no diagnoses linked to this encounter.  Continue healthy lifestyle choices, including diet (rich in fruits, vegetables, and lean proteins, and low in salt and simple carbohydrates) and exercise (at least 30 minutes of moderate physical activity daily).     The above assessment and management plan was discussed with the patient. The patient verbalized understanding of and has agreed to the management plan. Patient is aware to call the clinic if they develop any new symptoms or if symptoms persist or worsen. Patient is aware when to return to the clinic for a follow-up visit. Patient educated on when it is appropriate to go to the emergency department.  No follow-ups on file.  Lakie Mclouth [place] Thompson, DNP [facility] [address] [place], [place] [address] [phone]  Note: This document was prepared by [employer] voice dictation technology and any errors that results from this process are unintentional.

## 2023-12-09 ENCOUNTER — Ambulatory Visit: Payer: 59 | Admitting: Nurse Practitioner

## 2023-12-10 ENCOUNTER — Encounter: Payer: Self-pay | Admitting: Family Medicine

## 2023-12-23 ENCOUNTER — Telehealth (INDEPENDENT_AMBULATORY_CARE_PROVIDER_SITE_OTHER): Payer: 59 | Admitting: Family Medicine

## 2023-12-23 ENCOUNTER — Encounter: Payer: Self-pay | Admitting: Family Medicine

## 2023-12-23 DIAGNOSIS — F411 Generalized anxiety disorder: Secondary | ICD-10-CM

## 2023-12-23 DIAGNOSIS — M7989 Other specified soft tissue disorders: Secondary | ICD-10-CM | POA: Diagnosis not present

## 2023-12-23 DIAGNOSIS — R4589 Other symptoms and signs involving emotional state: Secondary | ICD-10-CM

## 2023-12-23 MED ORDER — SERTRALINE HCL 25 MG PO TABS: 25.0000 mg | ORAL_TABLET | Freq: Every day | ORAL | 3 refills | Status: DC

## 2023-12-23 NOTE — Telephone Encounter (Signed)
Put her in for a virtual visit

## 2023-12-23 NOTE — Progress Notes (Signed)
MyChart Video visit  Subjective: ZH:YQMVHQI PCP: Raliegh Ip, DO ONG:[id number] Makela Carreau is a 38 y.o. female. Patient provides verbal consent for consult held via video.  Due to COVID-19 pandemic this visit was conducted virtually. This visit type was conducted due to national recommendations for restrictions regarding the COVID-19 Pandemic (e.g. social distancing, sheltering in place) in an effort to limit this patient's exposure and mitigate transmission in our community. All issues noted in this document were discussed and addressed.  A physical exam was not performed with this format.   Location of patient: car Location of provider: [facility] Others present for call: none  1. Anxiety She was weaned off SSRI last year due to feeling like she no longer needed it.  She reports today that she has been really anxious about the soft tissue mass that is on her left rib area.  She was seen back in September and this is felt to be cystic perhaps.  She states that she saw dermatologist but was told that they could not guarantee it was a cyst but it seems consistent with that.  She notes is about gumball size and has some superior bruising which concerns her.  She typically gets a little down and anxious around this time anyway so she wanted to get back on the SSRI.   ROS: Per HPI  No Known Allergies Past Medical History:  Diagnosis Date   Anxiety    Headache    Missed abortion 05/2015   no surgery required   PONV (postoperative nausea and vomiting)    Stroke [facility])     Current Outpatient Medications:    aspirin 81 MG EC tablet, Take by mouth., Disp: , Rfl:    cetirizine (ZYRTEC ALLERGY) 10 MG tablet, Take 1 tablet (10 mg total) by mouth daily., Disp: 90 tablet, Rfl: 1   rosuvastatin (CRESTOR) 20 MG tablet, Take 1 tablet (20 mg total) by mouth daily., Disp: 90 tablet, Rfl: 3   sertraline (ZOLOFT) 25 MG tablet, Take 1/2 tablet daily x4 weeks.  Then 1/2 tablet every other day x2 weeks.  Then stop., Disp: 30 tablet, Rfl: 0  Gen: Nontoxic-appearing female no acute distress Psych: Mood stable, speech normal, affect appropriate.  Good eye insight.  Good eye contact.  Assessment/ Plan: 38 y.o. female   Anxiety about health - Plan: sertraline (ZOLOFT) 25 MG tablet  GAD (generalized anxiety disorder) - Plan: sertraline (ZOLOFT) 25 MG tablet  Soft tissue mass - Plan: [place] CHEST SOFT TISSUE  Restart Zoloft.  Will plan follow-up in 4 weeks via MyChart.  I went ahead and ordered an ultrasound of the soft tissue mass since this seems to be her primary concern and it seems to have some associated bruising.  She is aware that she will be contacted to set this visit up  Start time: 3:44pm End time: 3:50pm  Total time spent on patient care (including video visit/ documentation): 10 minutes  Hooria Gasparini Hulen Skains, DO [facility] [phone]

## 2023-12-31 ENCOUNTER — Ambulatory Visit (HOSPITAL_COMMUNITY)
Admission: RE | Admit: 2023-12-31 | Discharge: 2023-12-31 | Disposition: A | Discharge status: Home / Self Care | Payer: 59 | Source: Ambulatory Visit | Attending: Family Medicine | Admitting: Family Medicine

## 2023-12-31 DIAGNOSIS — M7989 Other specified soft tissue disorders: Secondary | ICD-10-CM | POA: Diagnosis present

## 2024-01-02 ENCOUNTER — Encounter: Payer: Self-pay | Admitting: Family Medicine

## 2024-01-05 ENCOUNTER — Encounter: Payer: Self-pay | Admitting: Family Medicine

## 2024-01-13 ENCOUNTER — Encounter (INDEPENDENT_AMBULATORY_CARE_PROVIDER_SITE_OTHER): Payer: Self-pay | Admitting: Family Medicine

## 2024-01-13 DIAGNOSIS — B009 Herpesviral infection, unspecified: Secondary | ICD-10-CM

## 2024-01-13 MED ORDER — VALACYCLOVIR HCL 500 MG PO TABS: 500.0000 mg | ORAL_TABLET | Freq: Every day | ORAL | 3 refills | Status: DC

## 2024-01-13 NOTE — Telephone Encounter (Signed)

## 2024-03-01 ENCOUNTER — Other Ambulatory Visit: Payer: Self-pay | Admitting: Obstetrics and Gynecology

## 2024-03-01 DIAGNOSIS — R928 Other abnormal and inconclusive findings on diagnostic imaging of breast: Secondary | ICD-10-CM

## 2024-03-16 ENCOUNTER — Ambulatory Visit
Admission: RE | Admit: 2024-03-16 | Discharge: 2024-03-16 | Disposition: A | Discharge status: Home / Self Care | Source: Ambulatory Visit | Attending: Obstetrics and Gynecology | Admitting: Obstetrics and Gynecology

## 2024-03-16 ENCOUNTER — Ambulatory Visit

## 2024-03-16 DIAGNOSIS — R928 Other abnormal and inconclusive findings on diagnostic imaging of breast: Secondary | ICD-10-CM

## 2024-05-22 ENCOUNTER — Other Ambulatory Visit: Payer: Self-pay | Admitting: Family Medicine

## 2024-05-22 DIAGNOSIS — E782 Mixed hyperlipidemia: Secondary | ICD-10-CM

## 2024-05-31 ENCOUNTER — Encounter: Payer: Self-pay | Admitting: Family Medicine

## 2024-07-06 ENCOUNTER — Ambulatory Visit: Admitting: Family Medicine

## 2024-07-06 ENCOUNTER — Ambulatory Visit (HOSPITAL_COMMUNITY)
Admission: RE | Admit: 2024-07-06 | Discharge: 2024-07-06 | Disposition: A | Discharge status: Home / Self Care | Source: Ambulatory Visit | Attending: Family Medicine | Admitting: Family Medicine

## 2024-07-06 ENCOUNTER — Encounter: Payer: Self-pay | Admitting: Family Medicine

## 2024-07-06 VITALS — BP 111/72 | HR 84 | Temp 97.3°F | Ht 64.0 in | Wt 188.0 lb

## 2024-07-06 DIAGNOSIS — M7989 Other specified soft tissue disorders: Secondary | ICD-10-CM

## 2024-07-06 MED ORDER — IOHEXOL 300 MG/ML  SOLN
100.0000 mL | Freq: Once | INTRAMUSCULAR | Status: AC | PRN
  Administered 2024-07-06: 100 mL via INTRAVENOUS

## 2024-07-06 NOTE — Progress Notes (Signed)
 Subjective: CC: Soft tissue mass PCP: Jolinda Kelly HERO, DO YEP:Gzwwpqzm Kelly [name] is a 38 y.o. female presenting to clinic today for:  1.  Soft tissue mass Patient with persistent soft tissue mass in the left upper quadrant.  This was discussed via virtual visit in January and we ordered ultrasound which did not show any abnormalities in the area of concern.  She reports that it seems to be fuller and more swollen and she does get some intermittent discomfort in the left upper quadrant but she does not have overt pain.  She reports no GI symptoms, unplanned weight loss or night sweats.  She does report some discoloration in the upper quadrant and increased anxiety about what this lesion could be.  She specifically is worried about cancer because she read an article that presented very similarly to her.  She is accompanied today's visit by her spouse.   ROS: Per HPI  No Known Allergies Past Medical History:  Diagnosis Date   Anxiety    Headache    Missed abortion 05/2015   no surgery required   PONV (postoperative nausea and vomiting)    Stroke [facility])     Current Outpatient Medications:    aspirin 81 MG EC tablet, Take by mouth., Disp: , Rfl:    cetirizine  (ZYRTEC  ALLERGY) 10 MG tablet, Take 1 tablet (10 mg total) by mouth daily., Disp: 90 tablet, Rfl: 1   rosuvastatin  (CRESTOR ) 20 MG tablet, Take 1 tablet (20 mg total) by mouth daily. **NEEDS TO BE SEEN BEFORE NEXT REFILL**, Disp: 30 tablet, Rfl: 0   sertraline  (ZOLOFT ) 25 MG tablet, Take 1 tablet (25 mg total) by mouth daily., Disp: 90 tablet, Rfl: 3   valACYclovir  (VALTREX ) 500 MG tablet, Take 1 tablet (500 mg total) by mouth daily. for cold sore prevention, Disp: 90 tablet, Rfl: 3 Social History   Socioeconomic History   Marital status: Married    Spouse name: Not on file   Number of children: Not on file   Years of education: Not on file   Highest education level: Not on file  Occupational History   Not on file   Tobacco Use   Smoking status: Never   Smokeless tobacco: Never  Vaping Use   Vaping status: Never Used  Substance and Sexual Activity   Alcohol use: No   Drug use: No   Sexual activity: Not on file    Comment: approx 12-[redacted] wks gestation per patient 12/22/17  Other Topics Concern   Not on file  Social History Narrative   Not on file   Social Drivers of Health   Financial Resource Strain: Low Risk  (12/31/2018)   Overall Financial Resource Strain (CARDIA)    Difficulty of Paying Living Expenses: Not hard at all  Food Insecurity: Low Risk  (04/17/2023)   Received from [facility]   Hunger Vital Sign    Within the past 12 months, you worried that your food would run out before you got money to buy more: Never true    Within the past 12 months, the food you bought just didn't last and you didn't have money to get more. : Never true  Transportation Needs: Not on file (04/17/2023)  Physical Activity: Not on file  Stress: No Stress Concern Present (12/31/2018)   [employer] of Occupational Health - Occupational Stress Questionnaire    Feeling of Stress : Only a little  Social Connections: Not on file  Intimate Partner Violence: Not At Risk (12/31/2018)  Humiliation, Afraid, Rape, and Kick questionnaire    Fear of Current or Ex-Partner: No    Emotionally Abused: No    Physically Abused: No    Sexually Abused: No   Family History  Problem Relation Age of Onset   Healthy Mother    Cancer Father        skin   Breast cancer Maternal Aunt    Cancer Maternal Aunt    Hypertension Maternal Uncle    Hypertension Maternal Grandfather    Cancer Other     Objective: Office vital signs reviewed. BP 111/72   Pulse 84   Temp (!) 97.3 F (36.3 C)   Ht 5' 4 (1.626 m)   Wt 188 lb (85.3 kg)   SpO2 96%   BMI 32.27 kg/m   Physical Examination:  General: Awake, alert, well nourished, No acute distress HEENT: sclera white, MMM GI: She has what appears to be an unevenly colored  oblong area of induration in the left upper quadrant just below the rib angle.  No appreciable umbilication or fluctuance.  It does not feel fixed but is not quite cystic.       Assessment/ Plan: 38 y.o. female   Soft tissue mass - Plan: CT ABDOMEN PELVIS W CONTRAST  Uncertain etiology as it is indurated but it does not feel cystic to me.  I am going to order a CAT scan to further evaluate this lesion.  We discussed that may need to consider referral back to dermatology at some point but she does have a dermatologist who thought it in fact may have been cystic.   Kelly CHRISTELLA Fielding, DO [facility] [phone]

## 2024-07-07 ENCOUNTER — Ambulatory Visit: Payer: Self-pay | Admitting: Family Medicine

## 2024-07-27 ENCOUNTER — Ambulatory Visit: Admitting: General Surgery

## 2024-08-17 ENCOUNTER — Other Ambulatory Visit: Payer: Self-pay | Admitting: Family Medicine

## 2024-08-17 DIAGNOSIS — E782 Mixed hyperlipidemia: Secondary | ICD-10-CM

## 2024-08-17 NOTE — Telephone Encounter (Signed)
 gottschalk pt NTBS 30-d given 05/24/24

## 2024-08-18 MED ORDER — ROSUVASTATIN CALCIUM 20 MG PO TABS: 20.0000 mg | ORAL_TABLET | Freq: Every day | ORAL | 1 refills | Status: DC

## 2024-08-18 NOTE — Addendum Note (Signed)
 Addended by: Chiann Goffredo D on: 08/18/2024 09:04 AM   Modules accepted: Orders

## 2024-08-18 NOTE — Telephone Encounter (Signed)
 Appt 10-27 with Dr. KANDICE. Patient needed after 2:30 appt.

## 2024-08-31 ENCOUNTER — Ambulatory Visit: Admitting: General Surgery

## 2024-08-31 ENCOUNTER — Encounter: Payer: Self-pay | Admitting: General Surgery

## 2024-08-31 VITALS — BP 93/66 | HR 82 | Temp 98.4°F | Resp 12 | Ht 64.0 in | Wt 188.0 lb

## 2024-08-31 DIAGNOSIS — D485 Neoplasm of uncertain behavior of skin: Secondary | ICD-10-CM | POA: Insufficient documentation

## 2024-08-31 NOTE — Patient Instructions (Addendum)
 Will remove your sutures on 10/9. Will call with the results.  Discharge Instructions:  Common Complaints: Pain at the incision site is common.  Bruising can occur. Some minor bleeding can occur.   Limit stretching, pulling on your incision if it is located on other parts of your body.  Leave the bandage in place for 48 hours and remove. Can replace and place neosporin on the area daily.  Ok to shower after first 48 hours. Bird bath until then.  Medication: Take tylenol  and ibuprofen  as needed for pain control, alternating every 4-6 hours.  Example:  Tylenol  1000mg  @ 6am, 12noon, 6pm, (Do not exceed 4000mg  of tylenol  a day). Ibuprofen  800mg  @ 9am, 3pm, 9pm, 3am (Do not exceed 3600mg  of ibuprofen  a day).

## 2024-08-31 NOTE — Progress Notes (Unsigned)
 [facility] History and Physical  Reason for Referral:*** Referring Physician: ***  Chief Complaint   New Patient (Initial Visit)     Kelly [name] is a 38 y.o. female.  HPI:   Discussed the use of AI scribe software for clinical note transcription with the patient, who gave verbal consent to proceed.  History of Present Illness      ***.  The *** started *** and has had a duration of ***.  It is associated with ***.  The *** is improved with ***, and is made worse with ***.    Quality*** Context***  Past Medical History:  Diagnosis Date  . Anxiety   . Headache   . Missed abortion 05/2015   no surgery required  . PONV (postoperative nausea and vomiting)   . Stroke [facility])     Past Surgical History:  Procedure Laterality Date  . CESAREAN SECTION N/A 09/12/2016   Procedure: CESAREAN SECTION;  Surgeon: Truman Corona, MD;  Location: [facility] BIRTHING SUITES;  Service: Obstetrics;  Laterality: N/A;  RNFA  . CESAREAN SECTION N/A 01/11/2019   Procedure: CESAREAN SECTION;  Surgeon: Corona Truman, MD;  Location: [facility] BIRTHING SUITES;  Service: Obstetrics;  Laterality: N/A;  Repeat edc 01/15/19 NKDA [name] RNFA  . DILATION AND EVACUATION N/A 12/23/2017   Procedure: DILATATION AND EVACUATION with Intraoperative Ultrasound ;  Surgeon: Corona Truman, MD;  Location: [facility] ORS;  Service: Gynecology;  Laterality: N/A;  . WISDOM TOOTH EXTRACTION      Family History  Problem Relation Age of Onset  . Healthy Mother   . Cancer Father        skin  . Breast cancer Maternal Aunt   . Cancer Maternal Aunt   . Hypertension Maternal Uncle   . Hypertension Maternal Grandfather   . Cancer Other     Social History   Tobacco Use  . Smoking status: Never  . Smokeless tobacco: Never  Vaping Use  . Vaping status: Never Used  Substance Use Topics  . Alcohol use: No  . Drug use: No    Medications: {medication reviewed/display:3041432} Allergies as of  08/31/2024   No Known Allergies      Medication List        Accurate as of August 31, 2024 10:25 AM. If you have any questions, ask your nurse or doctor.          aspirin EC 81 MG tablet Take by mouth.   cetirizine  10 MG tablet Commonly known as: ZyrTEC  Allergy Take 1 tablet (10 mg total) by mouth daily.   rosuvastatin  20 MG tablet Commonly known as: CRESTOR  Take 1 tablet (20 mg total) by mouth daily.   sertraline  25 MG tablet Commonly known as: ZOLOFT  Take 1 tablet (25 mg total) by mouth daily.   valACYclovir  500 MG tablet Commonly known as: Valtrex  Take 1 tablet (500 mg total) by mouth daily. for cold sore prevention         ROS:  {Review of Systems:30496}  Blood pressure 93/66, pulse 82, temperature 98.4 F (36.9 C), temperature source Oral, resp. rate 12, height 5' 4 (1.626 m), weight 188 lb (85.3 kg), SpO2 96%. Physical Exam Physical Exam   Results: No results found for this or any previous visit (from the past 48 hours).  No results found.   Assessment and Plan: Assessment and Plan Assessment & Plan      Kelly [name] is a 38 y.o. female with *** -*** -*** -Follow up ***  Future Appointments  Date Time Provider Department Center  09/09/2024  1:45 PM Kallie Manuelita BROCKS, MD [facility] None  09/27/2024  3:30 PM Jolinda Norene HERO, DO [facility] [address]     All questions were answered to the satisfaction of the patient and family***.  The risk and benefits of *** were discussed including but not limited to ***.  After careful consideration, Kelly [name] has decided to ***.    Manuelita BROCKS Kallie 08/31/2024, 10:25 AM

## 2024-09-06 LAB — TISSUE SPECIMEN

## 2024-09-06 LAB — PATHOLOGY REPORT

## 2024-09-07 ENCOUNTER — Ambulatory Visit (INDEPENDENT_AMBULATORY_CARE_PROVIDER_SITE_OTHER): Payer: Self-pay | Admitting: General Surgery

## 2024-09-07 ENCOUNTER — Other Ambulatory Visit: Payer: Self-pay | Admitting: *Deleted

## 2024-09-07 DIAGNOSIS — L940 Localized scleroderma [morphea]: Secondary | ICD-10-CM

## 2024-09-07 DIAGNOSIS — D485 Neoplasm of uncertain behavior of skin: Secondary | ICD-10-CM

## 2024-09-07 DIAGNOSIS — Z8673 Personal history of transient ischemic attack (TIA), and cerebral infarction without residual deficits: Secondary | ICD-10-CM

## 2024-09-07 NOTE — Progress Notes (Signed)
 Updated patient about the pathology and plan to refer to Rheumatology to ensure no systemic disease present. Will see her this week to get her sutures out.

## 2024-09-09 ENCOUNTER — Ambulatory Visit (INDEPENDENT_AMBULATORY_CARE_PROVIDER_SITE_OTHER): Admitting: General Surgery

## 2024-09-09 ENCOUNTER — Encounter: Payer: Self-pay | Admitting: General Surgery

## 2024-09-09 VITALS — BP 96/68 | HR 88 | Temp 98.6°F | Resp 14 | Ht 64.0 in | Wt 187.0 lb

## 2024-09-09 DIAGNOSIS — L940 Localized scleroderma [morphea]: Secondary | ICD-10-CM

## 2024-09-09 NOTE — Patient Instructions (Signed)
 For now will hold on any topical treatment of the Morphea and see what Rheumatology suggests as this is not something I have seen before.    Will follow up on the Rheumatology thoughts.

## 2024-09-09 NOTE — Progress Notes (Signed)
 [facility]  Doing well. Discussed pathology further.   BP 96/68   Pulse 88   Temp 98.6 F (37 C) (Oral)   Resp 14   Ht 5' 4 (1.626 m)   Wt 187 lb (84.8 kg)   SpO2 97%   BMI 32.10 kg/m  No redness or drainage, healing Removed her sutures. Steri strips placed.   Patient with Morphea on biopsy. Discussed referral to Rheumatology to ensure no systemic scleroderma symptoms.   For now will hold on any topical treatment of the Morphea and see what Rheumatology suggests as this is not something I have seen before.   Will follow up on the Rheumatology thoughts.   Future Appointments  Date Time Provider Department Center  09/27/2024  3:30 PM Jolinda Norene HERO, [place] [facility] [address]  10/13/2024  8:40 AM Jeannetta, Lonni ORN, MD [facility] None   Manuelita Pander, MD [facility] [address] [place], [place] [id number] [phone] (office)

## 2024-09-15 ENCOUNTER — Encounter: Payer: Self-pay | Admitting: Family Medicine

## 2024-09-15 DIAGNOSIS — Z8632 Personal history of gestational diabetes: Secondary | ICD-10-CM

## 2024-09-15 DIAGNOSIS — E66811 Obesity, class 1: Secondary | ICD-10-CM

## 2024-09-15 DIAGNOSIS — E782 Mixed hyperlipidemia: Secondary | ICD-10-CM

## 2024-09-15 DIAGNOSIS — Z8673 Personal history of transient ischemic attack (TIA), and cerebral infarction without residual deficits: Secondary | ICD-10-CM

## 2024-09-15 DIAGNOSIS — Z130 Encounter for screening for diseases of the blood and blood-forming organs and certain disorders involving the immune mechanism: Secondary | ICD-10-CM

## 2024-09-17 ENCOUNTER — Encounter: Payer: Self-pay | Admitting: Family Medicine

## 2024-09-17 NOTE — Telephone Encounter (Signed)
 Lab orders placed.  She can have Hep C screening and option for Hep B titers to determine need for vaccine if she wants but I didn't order them yet.  Just let me know if they need to be added.

## 2024-09-27 ENCOUNTER — Ambulatory Visit: Admitting: Family Medicine

## 2024-09-27 ENCOUNTER — Other Ambulatory Visit

## 2024-09-27 ENCOUNTER — Encounter: Payer: Self-pay | Admitting: Family Medicine

## 2024-09-27 VITALS — BP 115/66 | HR 75 | Temp 97.1°F | Ht 64.0 in | Wt 187.4 lb

## 2024-09-27 DIAGNOSIS — Z130 Encounter for screening for diseases of the blood and blood-forming organs and certain disorders involving the immune mechanism: Secondary | ICD-10-CM

## 2024-09-27 DIAGNOSIS — E66811 Obesity, class 1: Secondary | ICD-10-CM | POA: Diagnosis not present

## 2024-09-27 DIAGNOSIS — B009 Herpesviral infection, unspecified: Secondary | ICD-10-CM

## 2024-09-27 DIAGNOSIS — Z8632 Personal history of gestational diabetes: Secondary | ICD-10-CM

## 2024-09-27 DIAGNOSIS — L940 Localized scleroderma [morphea]: Secondary | ICD-10-CM

## 2024-09-27 DIAGNOSIS — E782 Mixed hyperlipidemia: Secondary | ICD-10-CM

## 2024-09-27 DIAGNOSIS — Z8673 Personal history of transient ischemic attack (TIA), and cerebral infarction without residual deficits: Secondary | ICD-10-CM

## 2024-09-27 DIAGNOSIS — F411 Generalized anxiety disorder: Secondary | ICD-10-CM | POA: Diagnosis not present

## 2024-09-27 DIAGNOSIS — R4589 Other symptoms and signs involving emotional state: Secondary | ICD-10-CM

## 2024-09-27 LAB — LIPID PANEL

## 2024-09-27 LAB — BAYER DCA HB A1C WAIVED: HB A1C (BAYER DCA - WAIVED): 4.4 % — ABNORMAL LOW (ref 4.8–5.6)

## 2024-09-27 MED ORDER — ROSUVASTATIN CALCIUM 20 MG PO TABS: 20.0000 mg | ORAL_TABLET | Freq: Every day | ORAL | 4 refills | Status: AC

## 2024-09-27 MED ORDER — VALACYCLOVIR HCL 500 MG PO TABS: 500.0000 mg | ORAL_TABLET | Freq: Every day | ORAL | 4 refills | Status: AC

## 2024-09-27 MED ORDER — SERTRALINE HCL 25 MG PO TABS: 25.0000 mg | ORAL_TABLET | Freq: Every day | ORAL | 4 refills | Status: AC

## 2024-09-27 NOTE — Progress Notes (Signed)
 Subjective: CC: Med refills PCP: Jolinda Norene HERO, DO YEP:[id number] Kelly [name] is a 38 y.o. female presenting to clinic today for:  Hyperlipidemia associated with obesity has been managed with Crestor  20 mg daily.  She reports no chest pain, shortness of breath, dizziness or other concerning features.  She reports that Valtrex  is being used as needed rather than daily and she has not had an HSV 1 breakout since January.  She continues to have some level of anxiety but feels overall the sertraline  25 mg is working well for her.  The lesion that we had examined this summer ended up being morphea and she is going to see rheumatology in a couple of weeks to discuss possible autoimmune etiology.  She is a little nervous about this   ROS: Per HPI  No Known Allergies Past Medical History:  Diagnosis Date   Anxiety    Headache    Missed abortion 05/2015   no surgery required   PONV (postoperative nausea and vomiting)    Stroke [facility])     Current Outpatient Medications:    aspirin 81 MG EC tablet, Take by mouth., Disp: , Rfl:    cetirizine  (ZYRTEC  ALLERGY) 10 MG tablet, Take 1 tablet (10 mg total) by mouth daily., Disp: 90 tablet, Rfl: 1   rosuvastatin  (CRESTOR ) 20 MG tablet, Take 1 tablet (20 mg total) by mouth daily., Disp: 30 tablet, Rfl: 1   sertraline  (ZOLOFT ) 25 MG tablet, Take 1 tablet (25 mg total) by mouth daily., Disp: 90 tablet, Rfl: 3   valACYclovir  (VALTREX ) 500 MG tablet, Take 1 tablet (500 mg total) by mouth daily. for cold sore prevention, Disp: 90 tablet, Rfl: 3 Social History   Socioeconomic History   Marital status: Married    Spouse name: Not on file   Number of children: Not on file   Years of education: Not on file   Highest education level: Not on file  Occupational History   Not on file  Tobacco Use   Smoking status: Never   Smokeless tobacco: Never  Vaping Use   Vaping status: Never Used  Substance and Sexual Activity   Alcohol use: No   Drug  use: No   Sexual activity: Not on file    Comment: approx 12-[redacted] wks gestation per patient 12/22/17  Other Topics Concern   Not on file  Social History Narrative   Not on file   Social Drivers of Health   Financial Resource Strain: Low Risk  (12/31/2018)   Overall Financial Resource Strain (CARDIA)    Difficulty of Paying Living Expenses: Not hard at all  Food Insecurity: Low Risk  (04/17/2023)   Received from [facility]   Hunger Vital Sign    Within the past 12 months, you worried that your food would run out before you got money to buy more: Never true    Within the past 12 months, the food you bought just didn't last and you didn't have money to get more. : Never true  Transportation Needs: Not on file (04/17/2023)  Physical Activity: Not on file  Stress: No Stress Concern Present (12/31/2018)   [employer] of Occupational [employer] - Occupational Stress Questionnaire    Feeling of Stress : Only a little  Social Connections: Not on file  Intimate Partner Violence: Not At Risk (12/31/2018)   Humiliation, Afraid, Rape, and Kick questionnaire    Fear of Current or Ex-Partner: No    Emotionally Abused: No    Physically Abused:  No    Sexually Abused: No   Family History  Problem Relation Age of Onset   Healthy Mother    Cancer Father        skin   Breast cancer Maternal Aunt    Cancer Maternal Aunt    Hypertension Maternal Uncle    Hypertension Maternal Grandfather    Cancer Other     Objective: Office vital signs reviewed. BP 115/66   Pulse 75   Temp (!) 97.1 F (36.2 C)   Ht 5' 4 (1.626 m)   Wt 187 lb 6 oz (85 kg)   SpO2 96%   BMI 32.16 kg/m   Physical Examination:  General: Awake, alert, well nourished, No acute distress HEENT: Sclera white.  Moist mucous membranes.  TMs intact bilaterally. Cardio: regular rate and rhythm, S1S2 heard, no murmurs appreciated Pulm: clear to auscultation bilaterally, no wheezes, rhonchi or rales; normal work of breathing on  room air GI: soft, non-tender, non-distended, bowel sounds present x4, no hepatomegaly, no splenomegaly, area of induration of the subcutaneous tissue still appreciated in the left upper/mid quadrant that is approximately walnut sized GU: No suprapubic tenderness to palpation. Extremities: warm, well perfused, No edema, cyanosis or clubbing; +2 pulses bilaterally MSK: Normal gait and station Neuro: No focal neurologic deficits  Assessment/ Plan: 38 y.o. female   Mixed hyperlipidemia - Plan: rosuvastatin  (CRESTOR ) 20 MG tablet  Obesity (BMI 30.0-34.9)  Anxiety about health - Plan: sertraline  (ZOLOFT ) 25 MG tablet  GAD (generalized anxiety disorder) - Plan: sertraline  (ZOLOFT ) 25 MG tablet  HSV-1 (herpes simplex virus 1) infection - Plan: valACYclovir  (VALTREX ) 500 MG tablet  Localized morphea   Fasting labs were collected today.  Medications have been renewed.  CPE done with OB/GYN and release of information form completed today for Pap smear results etc.  She declined vaccinations today  Sertraline  renewed.  HSV is stable with as needed Valtrex .  Reinforced 2 g twice daily for 1 day if needed for oral herpes outbreak  Look forward to seeing what rheumatology has to say about morphea identified on her abdomen   Norene CHRISTELLA Fielding, DO [facility] [phone]

## 2024-09-28 ENCOUNTER — Ambulatory Visit: Payer: Self-pay | Admitting: Family Medicine

## 2024-09-28 LAB — CBC WITH DIFFERENTIAL/PLATELET
Basophils Absolute: 0.1 x10E3/uL (ref 0.0–0.2)
Basos: 1 %
EOS (ABSOLUTE): 0.2 x10E3/uL (ref 0.0–0.4)
Eos: 4 %
Hematocrit: 39.3 % (ref 34.0–46.6)
Hemoglobin: 12.6 g/dL (ref 11.1–15.9)
Immature Grans (Abs): 0 x10E3/uL (ref 0.0–0.1)
Immature Granulocytes: 0 %
Lymphocytes Absolute: 1.2 x10E3/uL (ref 0.7–3.1)
Lymphs: 21 %
MCH: 29.5 pg (ref 26.6–33.0)
MCHC: 32.1 g/dL (ref 31.5–35.7)
MCV: 92 fL (ref 79–97)
Monocytes Absolute: 0.4 x10E3/uL (ref 0.1–0.9)
Monocytes: 7 %
Neutrophils Absolute: 3.8 x10E3/uL (ref 1.4–7.0)
Neutrophils: 67 %
Platelets: 219 x10E3/uL (ref 150–450)
RBC: 4.27 x10E6/uL (ref 3.77–5.28)
RDW: 13.4 % (ref 11.7–15.4)
WBC: 5.7 x10E3/uL (ref 3.4–10.8)

## 2024-09-28 LAB — TSH: TSH: 2.01 u[IU]/mL (ref 0.450–4.500)

## 2024-09-28 LAB — CMP14+EGFR
ALT: 23 IU/L (ref 0–32)
AST: 22 IU/L (ref 0–40)
Albumin: 4.2 g/dL (ref 3.9–4.9)
Alkaline Phosphatase: 76 IU/L (ref 41–116)
BUN/Creatinine Ratio: 17 (ref 9–23)
BUN: 13 mg/dL (ref 6–20)
Bilirubin Total: 0.8 mg/dL (ref 0.0–1.2)
CO2: 24 mmol/L (ref 20–29)
Calcium: 9.2 mg/dL (ref 8.7–10.2)
Chloride: 102 mmol/L (ref 96–106)
Creatinine, Ser: 0.77 mg/dL (ref 0.57–1.00)
Globulin, Total: 2.7 g/dL (ref 1.5–4.5)
Glucose: 100 mg/dL — AB (ref 70–99)
Potassium: 4.4 mmol/L (ref 3.5–5.2)
Sodium: 138 mmol/L (ref 134–144)
Total Protein: 6.9 g/dL (ref 6.0–8.5)
eGFR: 101 mL/min/1.73 (ref >59)

## 2024-09-28 LAB — LIPID PANEL
Cholesterol, Total: 149 mg/dL (ref 100–199)
HDL: 48 mg/dL (ref >39)
LDL CALC COMMENT:: 3.1 ratio (ref 0.0–4.4)
LDL Chol Calc (NIH): 74 mg/dL (ref 0–99)
Triglycerides: 156 mg/dL — AB (ref 0–149)
VLDL Cholesterol Cal: 27 mg/dL (ref 5–40)

## 2024-09-28 LAB — VITAMIN D 25 HYDROXY (VIT D DEFICIENCY, FRACTURES): Vit D, 25-Hydroxy: 31.5 ng/mL (ref 30.0–100.0)

## 2024-10-13 ENCOUNTER — Encounter: Admitting: Internal Medicine

## 2025-09-28 ENCOUNTER — Encounter: Payer: Self-pay | Admitting: Family Medicine
